# Patient Record
Sex: Female | Born: 1950 | Race: Black or African American | Hispanic: No | Marital: Married | Smoking: Never smoker
Health system: Southern US, Community
[De-identification: ages and names within clinical notes are randomized; demographics above are authoritative.]

## PROBLEM LIST (undated history)

## (undated) DIAGNOSIS — K219 Gastro-esophageal reflux disease without esophagitis: Secondary | ICD-10-CM

## (undated) DIAGNOSIS — C55 Malignant neoplasm of uterus, part unspecified: Secondary | ICD-10-CM

## (undated) DIAGNOSIS — Z87442 Personal history of urinary calculi: Secondary | ICD-10-CM

## (undated) DIAGNOSIS — G459 Transient cerebral ischemic attack, unspecified: Secondary | ICD-10-CM

## (undated) DIAGNOSIS — T8859XA Other complications of anesthesia, initial encounter: Secondary | ICD-10-CM

## (undated) DIAGNOSIS — I10 Essential (primary) hypertension: Secondary | ICD-10-CM

## (undated) DIAGNOSIS — R000 Tachycardia, unspecified: Secondary | ICD-10-CM

## (undated) DIAGNOSIS — E119 Type 2 diabetes mellitus without complications: Secondary | ICD-10-CM

## (undated) DIAGNOSIS — T4145XA Adverse effect of unspecified anesthetic, initial encounter: Secondary | ICD-10-CM

## (undated) DIAGNOSIS — M1990 Unspecified osteoarthritis, unspecified site: Secondary | ICD-10-CM

## (undated) DIAGNOSIS — E785 Hyperlipidemia, unspecified: Secondary | ICD-10-CM

## (undated) DIAGNOSIS — I38 Endocarditis, valve unspecified: Secondary | ICD-10-CM

## (undated) DIAGNOSIS — J45909 Unspecified asthma, uncomplicated: Secondary | ICD-10-CM

## (undated) HISTORY — DX: Unspecified asthma, uncomplicated: J45.909

## (undated) HISTORY — DX: Hyperlipidemia, unspecified: E78.5

## (undated) HISTORY — DX: Essential (primary) hypertension: I10

## (undated) HISTORY — PX: COLONOSCOPY: SHX174

## (undated) HISTORY — DX: Endocarditis, valve unspecified: I38

## (undated) HISTORY — PX: NASAL SINUS SURGERY: SHX719

## (undated) HISTORY — PX: PARTIAL HYSTERECTOMY: SHX80

## (undated) HISTORY — PX: KNEE ARTHROSCOPY: SHX127

## (undated) HISTORY — DX: Malignant neoplasm of uterus, part unspecified: C55

## (undated) HISTORY — PX: SINOSCOPY: SHX187

---
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
## 1987-09-30 DIAGNOSIS — C55 Malignant neoplasm of uterus, part unspecified: Secondary | ICD-10-CM

## 1987-09-30 HISTORY — DX: Malignant neoplasm of uterus, part unspecified: C55

## 1998-05-13 ENCOUNTER — Emergency Department (HOSPITAL_COMMUNITY): Admission: EM | Admit: 1998-05-13 | Discharge: 1998-05-13 | Payer: Self-pay | Admitting: Family Medicine

## 1999-02-08 ENCOUNTER — Other Ambulatory Visit: Admission: RE | Admit: 1999-02-08 | Discharge: 1999-02-08 | Payer: Self-pay | Admitting: Otolaryngology

## 1999-03-06 ENCOUNTER — Other Ambulatory Visit: Admission: RE | Admit: 1999-03-06 | Discharge: 1999-03-06 | Payer: Self-pay | Admitting: Obstetrics and Gynecology

## 1999-04-12 ENCOUNTER — Emergency Department (HOSPITAL_COMMUNITY): Admission: EM | Admit: 1999-04-12 | Discharge: 1999-04-12 | Payer: Self-pay | Admitting: Emergency Medicine

## 2001-10-10 ENCOUNTER — Emergency Department (HOSPITAL_COMMUNITY): Admission: EM | Admit: 2001-10-10 | Discharge: 2001-10-10 | Payer: Self-pay | Admitting: Emergency Medicine

## 2004-09-26 ENCOUNTER — Ambulatory Visit: Payer: Self-pay | Admitting: Internal Medicine

## 2004-11-09 ENCOUNTER — Emergency Department: Payer: Self-pay | Admitting: Emergency Medicine

## 2004-11-13 ENCOUNTER — Ambulatory Visit: Payer: Self-pay

## 2004-12-08 ENCOUNTER — Emergency Department (HOSPITAL_COMMUNITY): Admission: EM | Admit: 2004-12-08 | Discharge: 2004-12-08 | Payer: Self-pay | Admitting: Emergency Medicine

## 2005-09-26 ENCOUNTER — Emergency Department: Payer: Self-pay | Admitting: General Practice

## 2006-04-28 ENCOUNTER — Other Ambulatory Visit: Admission: RE | Admit: 2006-04-28 | Discharge: 2006-04-28 | Payer: Self-pay | Admitting: Gynecology

## 2006-06-01 ENCOUNTER — Emergency Department: Payer: Self-pay | Admitting: Emergency Medicine

## 2006-10-22 ENCOUNTER — Ambulatory Visit: Payer: Self-pay | Admitting: Otolaryngology

## 2007-02-18 ENCOUNTER — Other Ambulatory Visit: Payer: Self-pay

## 2007-02-18 ENCOUNTER — Inpatient Hospital Stay: Payer: Self-pay | Admitting: Internal Medicine

## 2007-05-03 ENCOUNTER — Other Ambulatory Visit: Admission: RE | Admit: 2007-05-03 | Discharge: 2007-05-03 | Payer: Self-pay | Admitting: Gynecology

## 2007-05-05 ENCOUNTER — Emergency Department: Payer: Self-pay | Admitting: Emergency Medicine

## 2007-09-01 ENCOUNTER — Other Ambulatory Visit: Payer: Self-pay

## 2007-09-01 ENCOUNTER — Emergency Department: Payer: Self-pay | Admitting: Emergency Medicine

## 2008-09-09 ENCOUNTER — Emergency Department: Payer: Self-pay | Admitting: Emergency Medicine

## 2008-11-08 ENCOUNTER — Emergency Department: Payer: Self-pay | Admitting: Emergency Medicine

## 2009-08-14 ENCOUNTER — Emergency Department: Payer: Self-pay | Admitting: Emergency Medicine

## 2009-09-30 ENCOUNTER — Emergency Department (HOSPITAL_COMMUNITY): Admission: EM | Admit: 2009-09-30 | Discharge: 2009-09-30 | Payer: Self-pay | Admitting: Family Medicine

## 2010-01-11 ENCOUNTER — Emergency Department (HOSPITAL_COMMUNITY): Admission: EM | Admit: 2010-01-11 | Discharge: 2010-01-11 | Payer: Self-pay | Admitting: *Deleted

## 2010-01-21 ENCOUNTER — Emergency Department (HOSPITAL_COMMUNITY): Admission: EM | Admit: 2010-01-21 | Discharge: 2010-01-21 | Payer: Self-pay | Admitting: Family Medicine

## 2010-02-20 ENCOUNTER — Ambulatory Visit: Payer: Self-pay | Admitting: Internal Medicine

## 2010-08-08 ENCOUNTER — Encounter (INDEPENDENT_AMBULATORY_CARE_PROVIDER_SITE_OTHER): Payer: Self-pay | Admitting: Otolaryngology

## 2010-08-08 ENCOUNTER — Ambulatory Visit (HOSPITAL_COMMUNITY): Admission: RE | Admit: 2010-08-08 | Discharge: 2010-08-09 | Payer: Self-pay | Admitting: Otolaryngology

## 2010-12-10 LAB — ANAEROBIC CULTURE

## 2010-12-10 LAB — URINALYSIS, ROUTINE W REFLEX MICROSCOPIC
Bilirubin Urine: NEGATIVE
Glucose, UA: NEGATIVE mg/dL
Hgb urine dipstick: NEGATIVE
Ketones, ur: NEGATIVE mg/dL
Nitrite: NEGATIVE
Protein, ur: NEGATIVE mg/dL
Specific Gravity, Urine: 1.018 (ref 1.005–1.030)
Urobilinogen, UA: 0.2 mg/dL (ref 0.0–1.0)
pH: 5.5 (ref 5.0–8.0)

## 2010-12-10 LAB — SURGICAL PCR SCREEN
MRSA, PCR: NEGATIVE
Staphylococcus aureus: NEGATIVE

## 2010-12-10 LAB — BASIC METABOLIC PANEL
BUN: 10 mg/dL (ref 6–23)
CO2: 24 mEq/L (ref 19–32)
Calcium: 9.3 mg/dL (ref 8.4–10.5)
Chloride: 110 mEq/L (ref 96–112)
Creatinine, Ser: 0.98 mg/dL (ref 0.4–1.2)
GFR calc Af Amer: >60 mL/min (ref >60)
Glucose, Bld: 110 mg/dL — ABNORMAL HIGH (ref 70–99)

## 2010-12-10 LAB — URINE MICROSCOPIC-ADD ON

## 2010-12-10 LAB — CBC
HCT: 38.8 % (ref 36.0–46.0)
Hemoglobin: 12.8 g/dL (ref 12.0–15.0)
MCH: 27.2 pg (ref 26.0–34.0)
MCHC: 33 g/dL (ref 30.0–36.0)
MCV: 82.4 fL (ref 78.0–100.0)
Platelets: 188 10*3/uL (ref 150–400)
RBC: 4.71 MIL/uL (ref 3.87–5.11)
RDW: 14.7 % (ref 11.5–15.5)
WBC: 6.9 K/uL (ref 4.0–10.5)

## 2010-12-10 LAB — BASIC METABOLIC PANEL WITH GFR
GFR calc non Af Amer: 58 mL/min — ABNORMAL LOW (ref >60)
Potassium: 4.4 meq/L (ref 3.5–5.1)
Sodium: 140 meq/L (ref 135–145)

## 2010-12-10 LAB — EAR CULTURE

## 2011-01-04 ENCOUNTER — Emergency Department: Payer: Self-pay | Admitting: Emergency Medicine

## 2011-01-09 ENCOUNTER — Emergency Department (HOSPITAL_COMMUNITY)
Admission: EM | Admit: 2011-01-09 | Discharge: 2011-01-09 | Disposition: A | Discharge status: Home / Self Care | Payer: Federal, State, Local not specified - PPO | Attending: Emergency Medicine | Admitting: Emergency Medicine

## 2011-01-09 ENCOUNTER — Emergency Department (HOSPITAL_COMMUNITY): Payer: Federal, State, Local not specified - PPO

## 2011-01-09 DIAGNOSIS — Z79899 Other long term (current) drug therapy: Secondary | ICD-10-CM | POA: Insufficient documentation

## 2011-01-09 DIAGNOSIS — J45909 Unspecified asthma, uncomplicated: Secondary | ICD-10-CM | POA: Insufficient documentation

## 2011-01-09 DIAGNOSIS — I10 Essential (primary) hypertension: Secondary | ICD-10-CM | POA: Insufficient documentation

## 2011-01-09 DIAGNOSIS — R630 Anorexia: Secondary | ICD-10-CM | POA: Insufficient documentation

## 2011-01-09 DIAGNOSIS — R109 Unspecified abdominal pain: Secondary | ICD-10-CM | POA: Insufficient documentation

## 2011-01-09 DIAGNOSIS — R110 Nausea: Secondary | ICD-10-CM | POA: Insufficient documentation

## 2011-01-09 DIAGNOSIS — K5900 Constipation, unspecified: Secondary | ICD-10-CM | POA: Insufficient documentation

## 2011-01-09 LAB — COMPREHENSIVE METABOLIC PANEL
ALT: 21 U/L (ref 0–35)
Alkaline Phosphatase: 85 U/L (ref 39–117)
CO2: 27 mEq/L (ref 19–32)
Chloride: 106 mEq/L (ref 96–112)
GFR calc non Af Amer: 56 mL/min — ABNORMAL LOW (ref >60)
Glucose, Bld: 115 mg/dL — ABNORMAL HIGH (ref 70–99)
Potassium: 3.7 mEq/L (ref 3.5–5.1)
Sodium: 138 mEq/L (ref 135–145)
Total Bilirubin: 0.4 mg/dL (ref 0.3–1.2)
Total Protein: 7.1 g/dL (ref 6.0–8.3)

## 2011-01-09 LAB — LIPASE, BLOOD: Lipase: 22 U/L (ref 11–59)

## 2011-01-09 LAB — DIFFERENTIAL
Basophils Absolute: 0.1 10*3/uL (ref 0.0–0.1)
Lymphocytes Relative: 40 % (ref 12–46)
Neutro Abs: 2.9 10*3/uL (ref 1.7–7.7)

## 2011-01-09 LAB — CBC
HCT: 37.8 % (ref 36.0–46.0)
Hemoglobin: 12.2 g/dL (ref 12.0–15.0)
RBC: 4.63 MIL/uL (ref 3.87–5.11)
WBC: 6.1 10*3/uL (ref 4.0–10.5)

## 2011-01-14 ENCOUNTER — Other Ambulatory Visit: Payer: Self-pay | Admitting: Gynecology

## 2011-10-26 ENCOUNTER — Emergency Department: Payer: Self-pay | Admitting: Unknown Physician Specialty

## 2012-02-02 IMAGING — CR DG ABDOMEN ACUTE W/ 1V CHEST
3 series · 3 of 3 positions shown · non-contrast
Comparison: Chest radiograph performed 08/05/2010

CLINICAL DATA: Right-sided abdominal pain, nausea and constipation.

ACUTE ABDOMEN SERIES (ABDOMEN 2 VIEW & CHEST 1 VIEW)

[w chest pa]
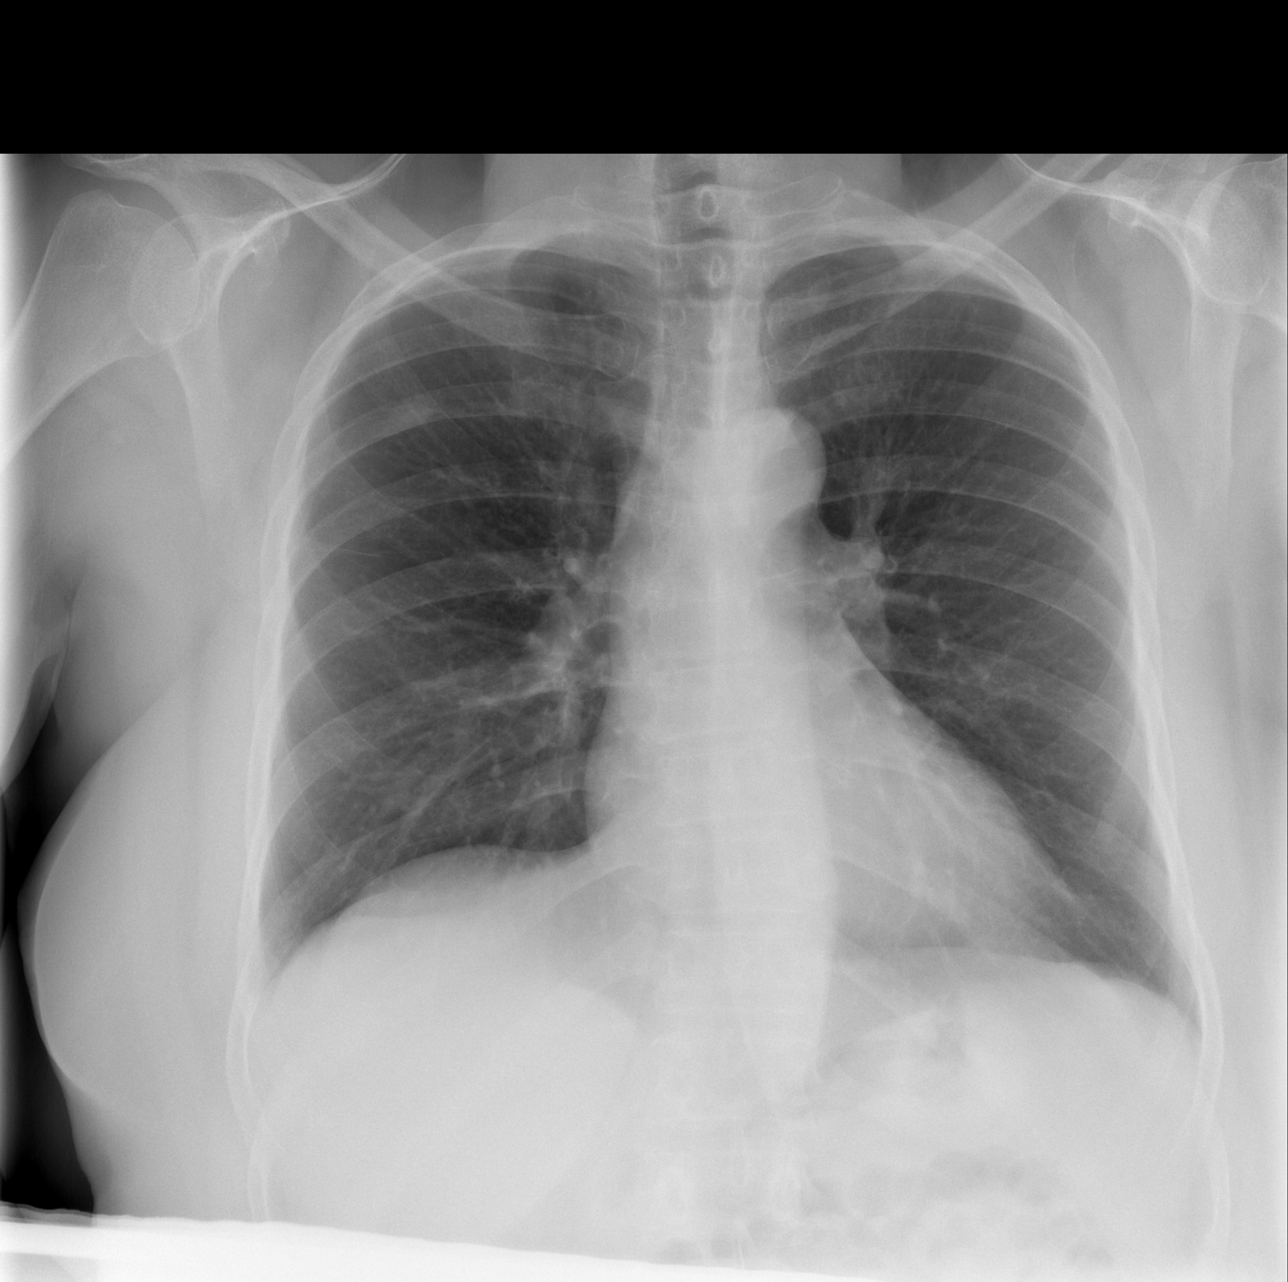

[w abdomen upright]
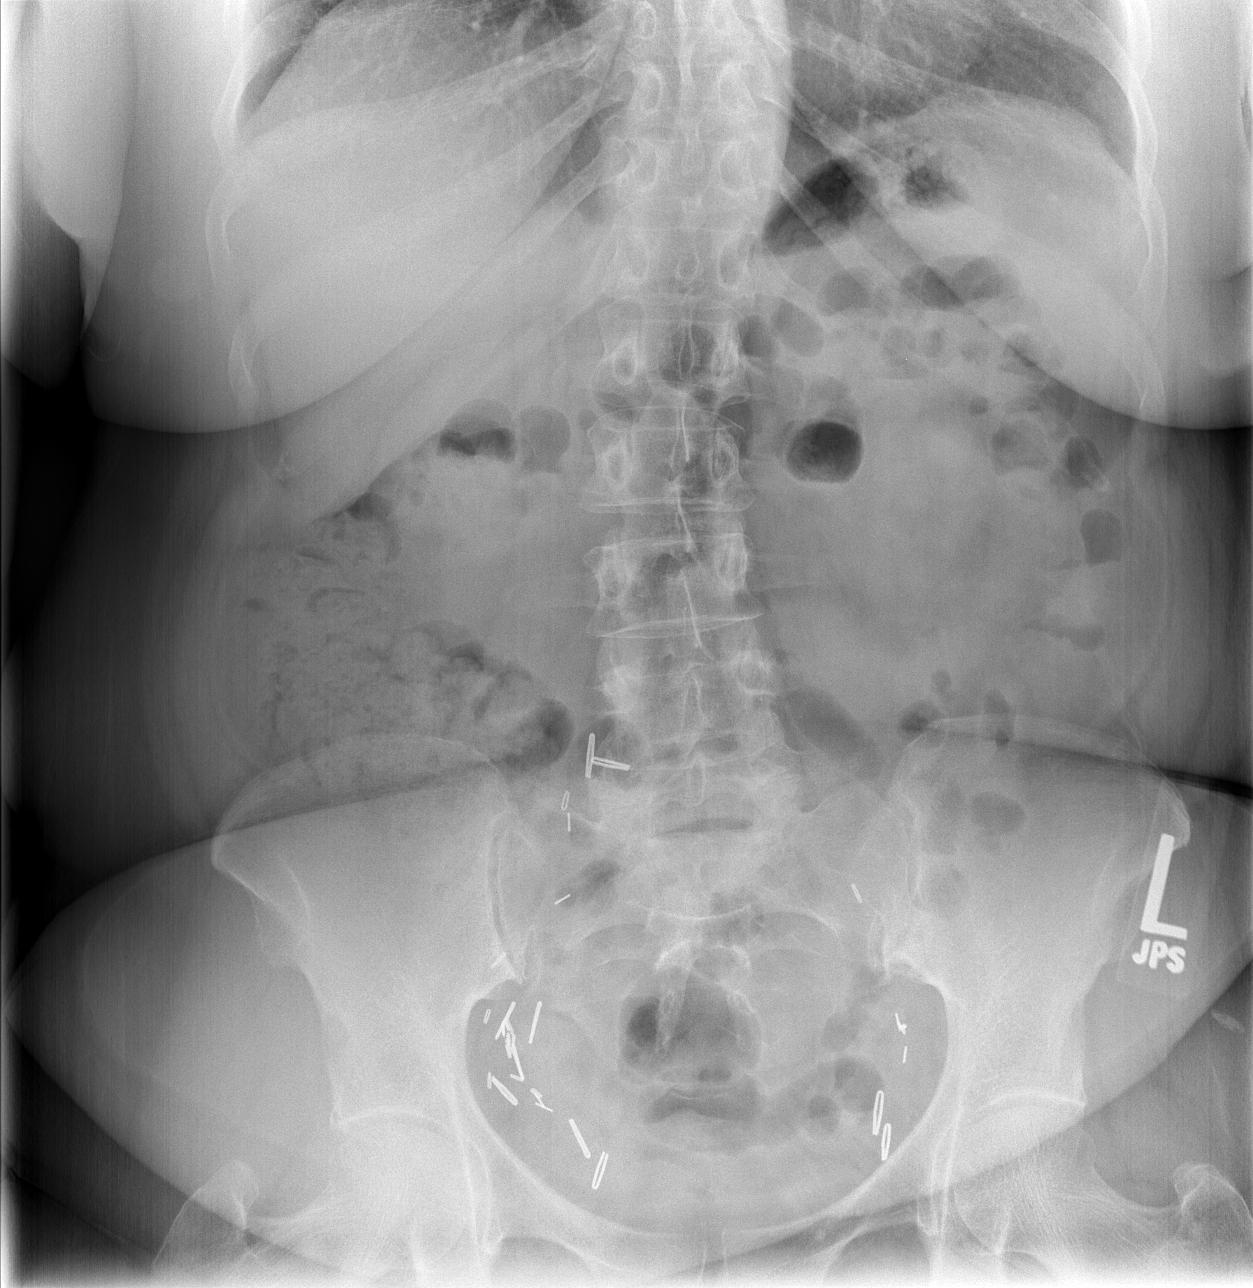

[t abdomen supine]
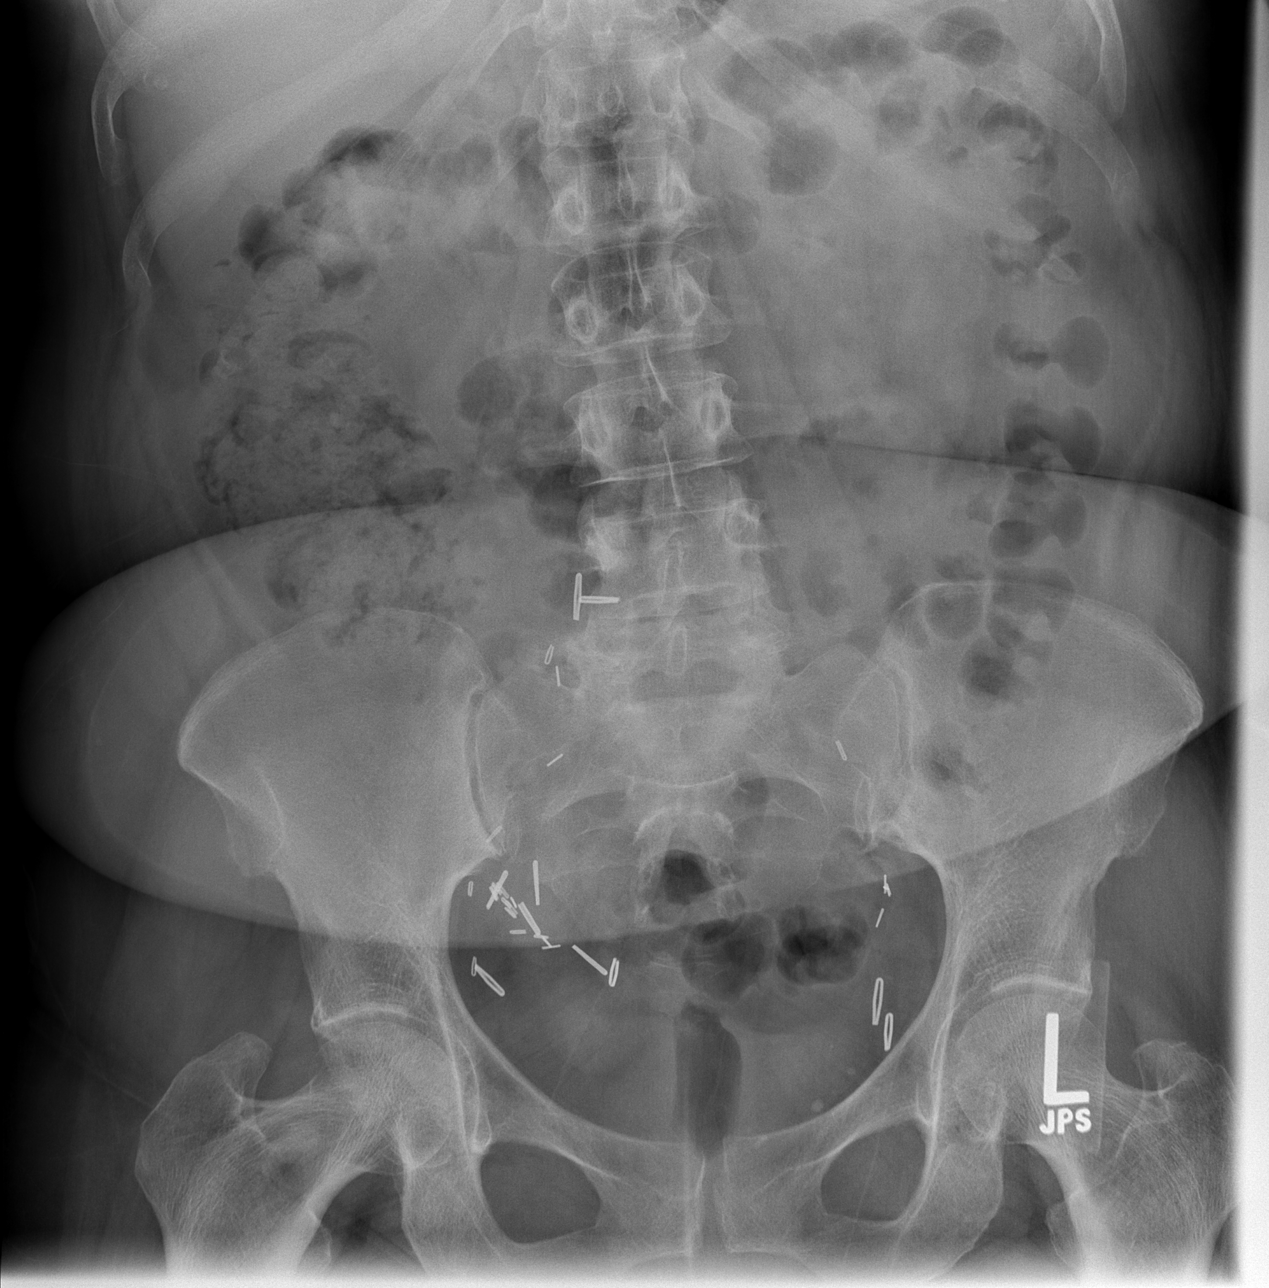

[3 of 3 positions shown; findings below may reference images not displayed]

FINDINGS: The lungs are well-aerated and clear.  There is no
evidence of focal opacification, pleural effusion or pneumothorax.
The cardiomediastinal silhouette is within normal limits.  A focus
of sclerosis overlying the right fifth posterior rib is unchanged
in appearance and likely reflects a bone island.

The visualized bowel gas pattern is unremarkable.  Scattered stool
and air are seen within the colon; there is no evidence of small
bowel dilatation to suggest obstruction.  No free intra-abdominal
air is identified on the provided upright view.

No acute osseous abnormalities are seen; the sacroiliac joints are
unremarkable in appearance.  Numerous clips are noted within the
pelvis.
IMPRESSION: 1.  Unremarkable bowel gas pattern; no free intra-abdominal air
seen.
2.  No acute cardiopulmonary process identified.

## 2012-02-10 ENCOUNTER — Other Ambulatory Visit: Payer: Self-pay | Admitting: Gynecology

## 2012-06-12 ENCOUNTER — Emergency Department: Payer: Self-pay | Admitting: Emergency Medicine

## 2012-06-12 LAB — URINALYSIS, COMPLETE
Bilirubin,UR: NEGATIVE
Glucose,UR: NEGATIVE mg/dL (ref 0–75)
Leukocyte Esterase: NEGATIVE
Nitrite: NEGATIVE
Ph: 5 (ref 4.5–8.0)
RBC,UR: 1 /HPF (ref 0–5)
Squamous Epithelial: <1

## 2012-06-12 LAB — CBC
HCT: 40.2 % (ref 35.0–47.0)
HGB: 13.3 g/dL (ref 12.0–16.0)
RBC: 4.89 10*6/uL (ref 3.80–5.20)
RDW: 15 % — ABNORMAL HIGH (ref 11.5–14.5)
WBC: 3.8 10*3/uL (ref 3.6–11.0)

## 2012-06-12 LAB — COMPREHENSIVE METABOLIC PANEL
Albumin: 3.6 g/dL (ref 3.4–5.0)
Alkaline Phosphatase: 97 U/L (ref 50–136)
BUN: 8 mg/dL (ref 7–18)
Bilirubin,Total: 0.3 mg/dL (ref 0.2–1.0)
Chloride: 107 mmol/L (ref 98–107)
Glucose: 98 mg/dL (ref 65–99)
Potassium: 4.1 mmol/L (ref 3.5–5.1)
SGOT(AST): 37 U/L (ref 15–37)
SGPT (ALT): 28 U/L (ref 12–78)
Total Protein: 7.7 g/dL (ref 6.4–8.2)

## 2013-07-13 ENCOUNTER — Ambulatory Visit: Payer: Self-pay | Admitting: Internal Medicine

## 2015-06-02 DIAGNOSIS — J4550 Severe persistent asthma, uncomplicated: Secondary | ICD-10-CM | POA: Insufficient documentation

## 2015-06-02 DIAGNOSIS — H1010 Acute atopic conjunctivitis, unspecified eye: Secondary | ICD-10-CM | POA: Insufficient documentation

## 2015-06-02 DIAGNOSIS — T63441A Toxic effect of venom of bees, accidental (unintentional), initial encounter: Secondary | ICD-10-CM | POA: Insufficient documentation

## 2015-06-02 DIAGNOSIS — K219 Gastro-esophageal reflux disease without esophagitis: Secondary | ICD-10-CM | POA: Insufficient documentation

## 2015-06-02 DIAGNOSIS — J329 Chronic sinusitis, unspecified: Secondary | ICD-10-CM | POA: Insufficient documentation

## 2015-06-02 DIAGNOSIS — H7010 Chronic mastoiditis, unspecified ear: Secondary | ICD-10-CM | POA: Insufficient documentation

## 2015-06-02 DIAGNOSIS — J309 Allergic rhinitis, unspecified: Secondary | ICD-10-CM | POA: Insufficient documentation

## 2015-06-02 DIAGNOSIS — T7800XA Anaphylactic reaction due to unspecified food, initial encounter: Secondary | ICD-10-CM | POA: Insufficient documentation

## 2015-06-08 ENCOUNTER — Other Ambulatory Visit: Payer: Self-pay | Admitting: *Deleted

## 2015-06-08 MED ORDER — OMALIZUMAB 150 MG ~~LOC~~ SOLR
150.0000 mg | SUBCUTANEOUS | Status: DC
  Administered 2015-08-14 – 2021-02-07 (×65): 150 mg via SUBCUTANEOUS

## 2015-07-25 ENCOUNTER — Ambulatory Visit (INDEPENDENT_AMBULATORY_CARE_PROVIDER_SITE_OTHER): Payer: Federal, State, Local not specified - PPO | Admitting: Allergy and Immunology

## 2015-07-25 ENCOUNTER — Encounter: Payer: Self-pay | Admitting: Allergy and Immunology

## 2015-07-25 VITALS — BP 98/76 | HR 76 | Temp 98.2°F | Resp 12

## 2015-07-25 DIAGNOSIS — J45901 Unspecified asthma with (acute) exacerbation: Secondary | ICD-10-CM

## 2015-07-25 DIAGNOSIS — J0100 Acute maxillary sinusitis, unspecified: Secondary | ICD-10-CM | POA: Diagnosis not present

## 2015-07-25 DIAGNOSIS — J4551 Severe persistent asthma with (acute) exacerbation: Secondary | ICD-10-CM | POA: Insufficient documentation

## 2015-07-25 DIAGNOSIS — T63441D Toxic effect of venom of bees, accidental (unintentional), subsequent encounter: Secondary | ICD-10-CM

## 2015-07-25 DIAGNOSIS — K219 Gastro-esophageal reflux disease without esophagitis: Secondary | ICD-10-CM

## 2015-07-25 DIAGNOSIS — J019 Acute sinusitis, unspecified: Secondary | ICD-10-CM | POA: Insufficient documentation

## 2015-07-25 HISTORY — DX: Acute maxillary sinusitis, unspecified: J01.00

## 2015-07-25 MED ORDER — EPINEPHRINE 0.3 MG/0.3ML IJ SOAJ: 0.3000 mg | INTRAMUSCULAR | Status: DC | PRN

## 2015-07-25 MED ORDER — LEVALBUTEROL HCL 1.25 MG/3ML IN NEBU
1.2500 mg | INHALATION_SOLUTION | Freq: Once | RESPIRATORY_TRACT | Status: AC
  Administered 2015-07-25: 1.25 mg via RESPIRATORY_TRACT

## 2015-07-25 MED ORDER — IPRATROPIUM BROMIDE 0.02 % IN SOLN
0.5000 mg | Freq: Once | RESPIRATORY_TRACT | Status: AC
  Administered 2015-07-25: 0.5 mg via RESPIRATORY_TRACT

## 2015-07-25 MED ORDER — BECLOMETHASONE DIPROPIONATE 80 MCG/ACT IN AERS: 2.0000 | INHALATION_SPRAY | Freq: Two times a day (BID) | RESPIRATORY_TRACT | Status: DC

## 2015-07-25 NOTE — Assessment & Plan Note (Signed)
   Continue omeprazole as prescribed.

## 2015-07-25 NOTE — Patient Instructions (Addendum)
Severe persistent asthma with exacerbation  Prednisone has been provided, 40 mg x3 days, 20 mg x1 day, 10 mg x1 day, then stop.     Anne Kerr has been asked to carefully monitor blood glucose levels while on prednisone.  She has verbalized understanding and has agreed to do so.   During respiratory tract infections and asthma flares, the patient may add Qvar 80 g, 2 inhalations via spacer device twice a day until symptoms have returned to baseline.  A sample and prescription have been provided for Qvar 80 g.  Resume Xolair injections as prescribed when asthma exacerbation and acute sinusitis have resolved.  The patient has been asked to contact me if her symptoms persist, progress, or if she becomes febrile. Otherwise, she may return for follow up in 1 month.   Acute sinusitis  Prednisone has been provided (as above).  Continue fluticasone nasal spray and nasal saline irrigation.  Guaifenesin 1200 mg twice daily as needed with adequate hydration as discussed.    GERD (gastroesophageal reflux disease)  Continue omeprazole as prescribed.  Bee sting reaction  Continue insect avoidance and have access to epinephrine autoinjector 2 pack.    Return in about 4 weeks (around 08/22/2015), or if symptoms worsen or fail to improve.

## 2015-07-25 NOTE — Progress Notes (Signed)
History of present illness: HPI Comments: Anne Kerr is a 64 y.o. female with severe persistent asthma on Xolair, allergic rhinosinusitis, gastroesophageal reflux disease, borderline diabetes, and history of hymenoptera venom hypersensitivity who presents today for sick visit.  She reports that over the past 2 weeks she has been experiencing sinus pressure, thick mucus production, postnasal drainage, nasal congestion, and ear fullness.  In addition, she has been experiencing coughing and wheezing, particularly at nighttime.  She has experienced nocturnal awakenings due to lower respiratory symptoms 5 out of the past 7 nights.  She denies fevers or chills.        Assessment and plan: Severe persistent asthma with exacerbation  Prednisone has been provided, 40 mg x3 days, 20 mg x1 day, 10 mg x1 day, then stop.     Anne Kerr has been asked to carefully monitor blood glucose levels while on prednisone.  She has verbalized understanding and has agreed to do so.   During respiratory tract infections and asthma flares, the patient may add Qvar 80 g, 2 inhalations via spacer device twice a day until symptoms have returned to baseline.  A sample and prescription have been provided for Qvar 80 g.  Resume Xolair injections as prescribed when asthma exacerbation and acute sinusitis have resolved.  The patient has been asked to contact me if her symptoms persist, progress, or if she becomes febrile. Otherwise, she may return for follow up in 1 month.   Acute sinusitis  Prednisone has been provided (as above).  Continue fluticasone nasal spray and nasal saline irrigation.  Guaifenesin 1200 mg twice daily as needed with adequate hydration as discussed.    GERD (gastroesophageal reflux disease)  Continue omeprazole as prescribed.  Bee sting reaction  Continue insect avoidance and have access to epinephrine autoinjector 2 pack.    Medications ordered this encounter: Meds ordered this  encounter  Medications  . EPINEPHrine (EPIPEN 2-PAK) 0.3 mg/0.3 mL IJ SOAJ injection    Sig: Inject 0.3 mLs (0.3 mg total) into the muscle as needed.    Dispense:  2 Device    Refill:  2  . levalbuterol (XOPENEX) nebulizer solution 1.25 mg    Sig:   . ipratropium (ATROVENT) nebulizer solution 0.5 mg    Sig:   . beclomethasone (QVAR) 80 MCG/ACT inhaler    Sig: Inhale 2 puffs into the lungs 2 (two) times daily.    Dispense:  1 Inhaler    Refill:  2     Diagnositics: Spirometry reveals FVC of 1.72 L and FEV1 of 1.26 L (64% predicted) with significant (380 mL, 28%) post bronchodilator improvement.     Physical examination: Blood pressure 98/76, pulse 76, temperature 98.2 F (36.8 C), temperature source Oral, resp. rate 12.  General: Alert, interactive, in no acute distress. HEENT: TMs pearly gray, turbinates moderately edematous with thick discharge, post-pharynx erythematous. Neck: Supple without lymphadenopathy. Lungs: Mildly decreased breath sounds with expiratory wheezing bilaterally. CV: Normal S1, S2 without murmurs. Skin: Warm and dry, without lesions or rashes.  The following portions of the patient's history were reviewed and updated as appropriate: allergies, current medications, past family history, past medical history, past social history, past surgical history and problem list.  Outpatient medications:   Medication List       This list is accurate as of: 07/25/15 12:59 PM.  Always use your most recent med list.               amLODipine 5 MG tablet  Commonly known as:  NORVASC  Take 5 mg by mouth daily.     beclomethasone 80 MCG/ACT inhaler  Commonly known as:  QVAR  Inhale 2 puffs into the lungs 2 (two) times daily.     EPINEPHrine 0.3 mg/0.3 mL Soaj injection  Commonly known as:  EPIPEN 2-PAK  Inject 0.3 mLs (0.3 mg total) into the muscle as needed.     fluticasone 110 MCG/ACT inhaler  Commonly known as:  FLOVENT HFA  Inhale 2 puffs into the lungs  2 (two) times daily.     lactobacillus acidophilus & bulgar chewable tablet  Chew 1 tablet by mouth 2 (two) times daily.     metFORMIN 500 MG tablet  Commonly known as:  GLUCOPHAGE  Take 500 mg by mouth daily.     montelukast 10 MG tablet  Commonly known as:  SINGULAIR  Take 10 mg by mouth at bedtime.     NASACORT AQ 55 MCG/ACT Aero nasal inhaler  Generic drug:  triamcinolone  Place 1 spray into the nose 3 (three) times a week.     NASACORT AQ NA  Place 2 sprays into the nose 2 (two) times daily.     omeprazole 20 MG capsule  Commonly known as:  PRILOSEC  Take 20 mg by mouth as needed.     PROAIR HFA 108 (90 BASE) MCG/ACT inhaler  Generic drug:  albuterol  Inhale 2 puffs into the lungs every 4 (four) hours as needed for wheezing or shortness of breath.     vitamin B-12 1000 MCG tablet  Commonly known as:  CYANOCOBALAMIN  Take 1,000 mcg by mouth daily.     VITAMIN D-3 PO  Take 800 mg by mouth daily.     XOLAIR 150 MG injection  Generic drug:  omalizumab  Inject 150 mg as directed every 30 (thirty) days.        Known medication allergies: Allergies  Allergen Reactions  . Aspirin Other (See Comments)    Wheeze  . Ibuprofen Shortness Of Breath, Other (See Comments) and Cough    Wheeze  . Shellfish Allergy Other (See Comments)    Asthma Symptoms  . Other Swelling    Bee Sting    I appreciate the opportunity to take part in this Jem's care. Please do not hesitate to contact me with questions.  Sincerely,   R. Edgar Frisk, MD

## 2015-07-25 NOTE — Assessment & Plan Note (Addendum)
   Prednisone has been provided (as above).  Continue fluticasone nasal spray and nasal saline irrigation.  Guaifenesin 1200 mg twice daily as needed with adequate hydration as discussed.

## 2015-07-25 NOTE — Assessment & Plan Note (Addendum)
   Prednisone has been provided, 40 mg x3 days, 20 mg x1 day, 10 mg x1 day, then stop.     Anne Kerr has been asked to carefully monitor blood glucose levels while on prednisone.  She has verbalized understanding and has agreed to do so.   During respiratory tract infections and asthma flares, the patient may add Qvar 80 g, 2 inhalations via spacer device twice a day until symptoms have returned to baseline.  A sample and prescription have been provided for Qvar 80 g.  Resume Xolair injections as prescribed when asthma exacerbation and acute sinusitis have resolved.  The patient has been asked to contact me if her symptoms persist, progress, or if she becomes febrile. Otherwise, she may return for follow up in 1 month.

## 2015-07-25 NOTE — Assessment & Plan Note (Signed)
   Continue insect avoidance and have access to epinephrine autoinjector 2 pack.

## 2015-07-27 ENCOUNTER — Other Ambulatory Visit: Payer: Self-pay | Admitting: *Deleted

## 2015-07-27 DIAGNOSIS — J4551 Severe persistent asthma with (acute) exacerbation: Secondary | ICD-10-CM

## 2015-07-31 ENCOUNTER — Telehealth: Payer: Self-pay | Admitting: Allergy and Immunology

## 2015-07-31 NOTE — Telephone Encounter (Signed)
Dr. Starling Manns please advise on below.

## 2015-07-31 NOTE — Telephone Encounter (Signed)
PT CALLED AND SAID THAT SHE HAS RASH ON HER ARMS AND FEET, BACK. SHE BEEN TAKING BENADRYL AND HELP SOME, WANT TO KNOW WANT TO DO. [phone]. [email or web address]

## 2015-07-31 NOTE — Telephone Encounter (Signed)
Continue diphenhydramine and may add cetirizine.  If rash persists or progresses, come in for follow up.  For any symptoms concerning for anaphylaxis, seek immediate medical attention.

## 2015-07-31 NOTE — Telephone Encounter (Signed)
Patient informed of Dr. Mariane Masters instructions.

## 2015-08-14 ENCOUNTER — Ambulatory Visit (INDEPENDENT_AMBULATORY_CARE_PROVIDER_SITE_OTHER): Payer: Federal, State, Local not specified - PPO | Admitting: Allergy and Immunology

## 2015-08-14 ENCOUNTER — Encounter: Payer: Self-pay | Admitting: Allergy and Immunology

## 2015-08-14 ENCOUNTER — Ambulatory Visit (INDEPENDENT_AMBULATORY_CARE_PROVIDER_SITE_OTHER): Payer: Federal, State, Local not specified - PPO

## 2015-08-14 VITALS — BP 130/82 | HR 72 | Resp 16

## 2015-08-14 DIAGNOSIS — T63441D Toxic effect of venom of bees, accidental (unintentional), subsequent encounter: Secondary | ICD-10-CM | POA: Diagnosis not present

## 2015-08-14 DIAGNOSIS — H1010 Acute atopic conjunctivitis, unspecified eye: Secondary | ICD-10-CM

## 2015-08-14 DIAGNOSIS — J4540 Moderate persistent asthma, uncomplicated: Secondary | ICD-10-CM | POA: Diagnosis not present

## 2015-08-14 DIAGNOSIS — J4550 Severe persistent asthma, uncomplicated: Secondary | ICD-10-CM

## 2015-08-14 DIAGNOSIS — J309 Allergic rhinitis, unspecified: Secondary | ICD-10-CM

## 2015-08-14 MED ORDER — ALBUTEROL SULFATE (2.5 MG/3ML) 0.083% IN NEBU: 2.5000 mg | INHALATION_SOLUTION | RESPIRATORY_TRACT | Status: AC | PRN

## 2015-08-14 NOTE — Assessment & Plan Note (Addendum)
Today's spirometry results, assessed while asymptomatic, suggest that the patient may be an under-perceiver of bronchoconstriction.  For now, continue Xolair every month, Qvar 80 g, 2 inhalations via spacer device twice a day, montelukast 10 mg daily at bedtime, and albuterol HFA, 1-2 inhalations every 4-6 hours as needed.  During upper respiratory tract infections or asthma flares, increase Qvar 80 g to 3 inhalations via spacer device 3 times per day until symptoms have returned baseline.  Subjective and objective measures of pulmonary function will be followed and the treatment plan will be adjusted accordingly.

## 2015-08-14 NOTE — Progress Notes (Signed)
History of present illness: HPI Comments: Anne Kerr is a 64 y.o. female with severe persistent asthma, allergic rhinoconjunctivitis, and hymenoptera venom hypersensitivity who presents today for follow up.  She reports that her asthma exacerbation and sinusitis resolved within a week of her previous visit and her upper and lower respiration symptoms have been well-controlled in the interval since that visit.  She denies nocturnal awakenings or limitations in daily activities due to asthma symptoms.  She has no nasal/sinus symptom complaints today.  She has had no insect stings since her previous visit and has access to epinephrine autoinjector 2 pack.   Assessment and plan: Severe persistent asthma Today's spirometry results, assessed while asymptomatic, suggest that the patient may be an under-perceiver of bronchoconstriction.  For now, continue Xolair every month, Qvar 80 g, 2 inhalations via spacer device twice a day, montelukast 10 mg daily at bedtime, and albuterol HFA, 1-2 inhalations every 4-6 hours as needed.  During upper respiratory tract infections or asthma flares, increase Qvar 80 g to 3 inhalations via spacer device 3 times per day until symptoms have returned baseline.  Subjective and objective measures of pulmonary function will be followed and the treatment plan will be adjusted accordingly.  Allergic rhinoconjunctivitis Stable.  Continue allergen avoidance measures, montelukast 10 mg daily at bedtime, and triamcinolone nasal spray as needed.  Nasal saline lavage as needed has been recommended along with instructions for proper administration.  Bee sting reaction  Continue insect avoidance and have access to epinephrine autoinjector 2 pack.    Medications ordered this encounter: No orders of the defined types were placed in this encounter.    Diagnositics: Spirometry reveals FVC of 1.70 L and FEV1 of 1.28 L (65% predicted) with significant (210 mL, 17%)  postbronchodilator improvement.     Physical examination: Blood pressure 130/82, pulse 72, resp. rate 16.  General: Alert, interactive, in no acute distress. HEENT: TMs pearly gray, turbinates mildly edematous without discharge, post-pharynx non erythematous. Neck: Supple without lymphadenopathy. Lungs: Mildly decreased breath sounds bilaterally without wheezing, rhonchi or rales. CV: Normal S1, S2 without murmurs. Skin: Warm and dry, without lesions or rashes.  The following portions of the patient's history were reviewed and updated as appropriate: allergies, current medications, past family history, past medical history, past social history, past surgical history and problem list.  Outpatient medications:   Medication List       This list is accurate as of: 08/14/15 12:31 PM.  Always use your most recent med list.               amLODipine 5 MG tablet  Commonly known as:  NORVASC  Take 5 mg by mouth daily.     beclomethasone 80 MCG/ACT inhaler  Commonly known as:  QVAR  Inhale 2 puffs into the lungs 2 (two) times daily.     EPINEPHrine 0.3 mg/0.3 mL Soaj injection  Commonly known as:  EPIPEN 2-PAK  Inject 0.3 mLs (0.3 mg total) into the muscle as needed.     fluticasone 110 MCG/ACT inhaler  Commonly known as:  FLOVENT HFA  Inhale 2 puffs into the lungs 2 (two) times daily.     lactobacillus acidophilus & bulgar chewable tablet  Chew 1 tablet by mouth 2 (two) times daily.     metFORMIN 500 MG tablet  Commonly known as:  GLUCOPHAGE  Take 500 mg by mouth daily.     montelukast 10 MG tablet  Commonly known as:  SINGULAIR  Take 10 mg by  mouth at bedtime.     NASACORT AQ 55 MCG/ACT Aero nasal inhaler  Generic drug:  triamcinolone  Place 1 spray into the nose 3 (three) times a week.     NASACORT AQ NA  Place 2 sprays into the nose 2 (two) times daily.     omeprazole 20 MG capsule  Commonly known as:  PRILOSEC  Take 20 mg by mouth as needed.     PROAIR HFA  108 (90 BASE) MCG/ACT inhaler  Generic drug:  albuterol  Inhale 2 puffs into the lungs every 4 (four) hours as needed for wheezing or shortness of breath.     vitamin B-12 1000 MCG tablet  Commonly known as:  CYANOCOBALAMIN  Take 1,000 mcg by mouth daily.     VITAMIN D-3 PO  Take 800 mg by mouth daily.     XOLAIR 150 MG injection  Generic drug:  omalizumab  Inject 150 mg as directed every 30 (thirty) days.        Known medication allergies: Allergies  Allergen Reactions  . Aspirin Other (See Comments)    Wheeze  . Ibuprofen Shortness Of Breath, Other (See Comments) and Cough    Wheeze  . Shellfish Allergy Other (See Comments)    Asthma Symptoms  . Other Swelling    Bee Sting    I appreciate the opportunity to take part in this Tayloranne's care. Please do not hesitate to contact me with questions.  Sincerely,   R. Edgar Frisk, MD

## 2015-08-14 NOTE — Assessment & Plan Note (Signed)
   Continue insect avoidance and have access to epinephrine autoinjector 2 pack. 

## 2015-08-14 NOTE — Assessment & Plan Note (Signed)
Stable.  Continue allergen avoidance measures, montelukast 10 mg daily at bedtime, and triamcinolone nasal spray as needed.  Nasal saline lavage as needed has been recommended along with instructions for proper administration.

## 2015-08-14 NOTE — Patient Instructions (Addendum)
Severe persistent asthma Today's spirometry results, assessed while asymptomatic, suggest that the patient may be an under-perceiver of bronchoconstriction.  For now, continue Xolair every month, Qvar 80 g, 2 inhalations via spacer device twice a day, montelukast 10 mg daily at bedtime, and albuterol HFA, 1-2 inhalations every 4-6 hours as needed.  During upper respiratory tract infections or asthma flares, increase Qvar 80 g to 3 inhalations via spacer device 3 times per day until symptoms have returned baseline.  Subjective and objective measures of pulmonary function will be followed and the treatment plan will be adjusted accordingly.  Allergic rhinoconjunctivitis Stable.  Continue allergen avoidance measures, montelukast 10 mg daily at bedtime, and triamcinolone nasal spray as needed.  Nasal saline lavage as needed has been recommended along with instructions for proper administration.  Bee sting reaction  Continue insect avoidance and have access to epinephrine autoinjector 2 pack.    Return in about 4 months (around 12/12/2015), or if symptoms worsen or fail to improve.

## 2015-08-14 NOTE — Assessment & Plan Note (Signed)
>>  ASSESSMENT AND PLAN FOR ALLERGIC RHINITIS WRITTEN ON 08/14/2015 12:07 PM BY BOBBITT, Heywood Iles, MD  Stable. Continue allergen avoidance measures, montelukast 10 mg daily at bedtime, and triamcinolone nasal spray as needed. Nasal saline lavage as needed has been recommended along with instructions for proper administration.

## 2015-09-10 ENCOUNTER — Ambulatory Visit (INDEPENDENT_AMBULATORY_CARE_PROVIDER_SITE_OTHER): Payer: Federal, State, Local not specified - PPO | Admitting: Neurology

## 2015-09-10 DIAGNOSIS — J4540 Moderate persistent asthma, uncomplicated: Secondary | ICD-10-CM | POA: Diagnosis not present

## 2015-10-15 ENCOUNTER — Ambulatory Visit (INDEPENDENT_AMBULATORY_CARE_PROVIDER_SITE_OTHER): Payer: Federal, State, Local not specified - PPO

## 2015-10-15 DIAGNOSIS — J4540 Moderate persistent asthma, uncomplicated: Secondary | ICD-10-CM

## 2015-11-12 ENCOUNTER — Other Ambulatory Visit: Payer: Self-pay

## 2015-11-12 DIAGNOSIS — J4551 Severe persistent asthma with (acute) exacerbation: Secondary | ICD-10-CM

## 2015-11-12 MED ORDER — BECLOMETHASONE DIPROPIONATE 80 MCG/ACT IN AERS: 2.0000 | INHALATION_SPRAY | Freq: Two times a day (BID) | RESPIRATORY_TRACT | Status: DC

## 2015-11-13 ENCOUNTER — Other Ambulatory Visit: Payer: Self-pay | Admitting: Allergy and Immunology

## 2015-11-19 ENCOUNTER — Ambulatory Visit (INDEPENDENT_AMBULATORY_CARE_PROVIDER_SITE_OTHER): Payer: Federal, State, Local not specified - PPO

## 2015-11-19 DIAGNOSIS — J4540 Moderate persistent asthma, uncomplicated: Secondary | ICD-10-CM | POA: Diagnosis not present

## 2015-12-10 ENCOUNTER — Ambulatory Visit: Payer: Federal, State, Local not specified - PPO | Admitting: Allergy and Immunology

## 2015-12-11 ENCOUNTER — Ambulatory Visit (INDEPENDENT_AMBULATORY_CARE_PROVIDER_SITE_OTHER): Payer: Federal, State, Local not specified - PPO | Admitting: Allergy and Immunology

## 2015-12-11 VITALS — BP 110/80 | HR 100 | Resp 18

## 2015-12-11 DIAGNOSIS — H7010 Chronic mastoiditis, unspecified ear: Secondary | ICD-10-CM | POA: Diagnosis not present

## 2015-12-11 DIAGNOSIS — J4540 Moderate persistent asthma, uncomplicated: Secondary | ICD-10-CM

## 2015-12-11 DIAGNOSIS — J309 Allergic rhinitis, unspecified: Secondary | ICD-10-CM

## 2015-12-11 DIAGNOSIS — K219 Gastro-esophageal reflux disease without esophagitis: Secondary | ICD-10-CM | POA: Diagnosis not present

## 2015-12-11 DIAGNOSIS — J329 Chronic sinusitis, unspecified: Secondary | ICD-10-CM

## 2015-12-11 DIAGNOSIS — H1010 Acute atopic conjunctivitis, unspecified eye: Secondary | ICD-10-CM | POA: Diagnosis not present

## 2015-12-11 MED ORDER — METHYLPREDNISOLONE ACETATE 80 MG/ML IJ SUSP
80.0000 mg | Freq: Once | INTRAMUSCULAR | Status: AC
  Administered 2015-12-11: 80 mg via INTRAMUSCULAR

## 2015-12-11 MED ORDER — AMOXICILLIN-POT CLAVULANATE 875-125 MG PO TABS: ORAL_TABLET | ORAL | Status: DC

## 2015-12-11 NOTE — Patient Instructions (Addendum)
  1. Depo-Medrol 80 IM delivered in clinic today  2. Augmentin 875 one tablet twice a day for the next 20 days  3. Continue Qvar 80 2 inhalations twice a day, Nasacort one spray each nostril 3-7 times per week, montelukast 10 mg daily  4. Continue Xolair and EpiPen  5. Continue omeprazole 20 mg a day  6. Continue Proventil HFA if needed  7. Further treatment?  8. Return to clinic in July 2017 or earlier if problem

## 2015-12-11 NOTE — Progress Notes (Signed)
Follow-up Note  Referring Provider: Allyne Gee, MD Primary Provider: Allyne Gee, MD Date of Office Visit: 12/11/2015  Subjective:   Anne Kerr (DOB: 09-09-1951) is a 65 y.o. female who returns to the [facility] on 12/11/2015 in re-evaluation of the following:  HPI Comments: Jozelynn presents this clinic with a 2 week history of developing problems with significant nasal congestion and nose blowing with bloody mucus and head stopped up and years stopped up. Prior to this point time she is doing quite well regarding her allergic rhinoconjunctivitis and asthma. She did not have to use a short acting bronchodilator nor did she have to use a steroid in the treatment of a respiratory tract flare while consistently using Xolair and Qvar. Her upper airways were doing quite well while consistently using a nasal steroid and montelukast. Reflux was under excellent control while using omeprazole. She did obtain the flu vaccine this fall.     Outpatient Prescriptions Prior to Visit  Medication Sig Dispense Refill  . albuterol (PROAIR HFA) 108 (90 BASE) MCG/ACT inhaler Inhale 2 puffs into the lungs every 4 (four) hours as needed for wheezing or shortness of breath.    Marland Kitchen albuterol (PROVENTIL) (2.5 MG/3ML) 0.083% nebulizer solution Take 3 mLs (2.5 mg total) by nebulization every 4 (four) hours as needed for wheezing or shortness of breath. 75 mL 1  . amLODipine (NORVASC) 5 MG tablet Take 5 mg by mouth daily.    . BD DISP NEEDLES 25G X 5/8" MISC USE AS DIRECTED TO ADMINISTER XOLAIR 3 each 11  . beclomethasone (QVAR) 80 MCG/ACT inhaler Inhale 2 puffs into the lungs 2 (two) times daily. 1 Inhaler 0  . Cholecalciferol (VITAMIN D-3 PO) Take 800 mg by mouth daily.    Marland Kitchen EPINEPHrine (EPIPEN 2-PAK) 0.3 mg/0.3 mL IJ SOAJ injection Inject 0.3 mLs (0.3 mg total) into the muscle as needed. 2 Device 2  . lactobacillus acidophilus & bulgar (LACTINEX) chewable tablet Chew 1 tablet by mouth 2  (two) times daily.  0  . montelukast (SINGULAIR) 10 MG tablet Take 10 mg by mouth at bedtime.    Marland Kitchen omeprazole (PRILOSEC) 20 MG capsule Take 20 mg by mouth as needed.    . triamcinolone (NASACORT AQ) 55 MCG/ACT AERO nasal inhaler Place 1 spray into the nose 3 (three) times a week.    . Triamcinolone Acetonide (NASACORT AQ NA) Place 2 sprays into the nose 2 (two) times daily.    . vitamin B-12 (CYANOCOBALAMIN) 1000 MCG tablet Take 1,000 mcg by mouth daily.    . Water For Injection Sterile (STERILE WATER, PRESERVATIVE FREE,) injection RECONSTITUTE EACH 150MG  VIAL OF Arvid Right WITH 1.4ML OF STERILE WATER. STORE AT ROOM TEMPERATURE. DISCARD AFTER USE. 1 mL 11  . XOLAIR 150 MG injection Inject 150 mg as directed every 30 (thirty) days.    . metFORMIN (GLUCOPHAGE) 500 MG tablet Take 500 mg by mouth daily. Reported on 12/11/2015     Facility-Administered Medications Prior to Visit  Medication Dose Route Frequency Provider Last Rate Last Dose  . omalizumab Arvid Right) injection 150 mg  150 mg Subcutaneous Q28 days Jiles Prows, MD   150 mg at 11/19/15 1131    No past medical history on file.  No past surgical history on file.  Allergies  Allergen Reactions  . Aspirin Other (See Comments)    Wheeze  . Ibuprofen Shortness Of Breath, Other (See Comments) and Cough    Wheeze  . Shellfish Allergy  Other (See Comments)    Asthma Symptoms  . Other Swelling    Bee Sting    Review of systems negative except as noted in HPI / PMHx or noted below:  Review of Systems  Constitutional: Negative.   HENT: Negative.   Eyes: Negative.   Respiratory: Negative.   Cardiovascular: Negative.   Gastrointestinal: Negative.   Genitourinary: Negative.   Musculoskeletal: Negative.   Skin: Negative.   Neurological: Negative.   Endo/Heme/Allergies: Negative.   Psychiatric/Behavioral: Negative.      Objective:   Filed Vitals:   12/11/15 1038  BP: 110/80  Pulse: 100  Resp: 18          Physical Exam    Constitutional: She is well-developed, well-nourished, and in no distress.  Nasal voice  HENT:  Head: Normocephalic.  Right Ear: External ear and ear canal normal. Tympanic membrane is erythematous. A middle ear effusion is present.  Left Ear: External ear and ear canal normal. Tympanic membrane is scarred.  Nose: Mucosal edema (Erythematous) present. No rhinorrhea.  Mouth/Throat: Uvula is midline, oropharynx is clear and moist and mucous membranes are normal. No oropharyngeal exudate.  Eyes: Conjunctivae are normal.  Neck: Trachea normal. No tracheal tenderness present. No tracheal deviation present. No thyromegaly present.  Cardiovascular: Normal rate, regular rhythm, S1 normal, S2 normal and normal heart sounds.   No murmur heard. Pulmonary/Chest: Breath sounds normal. No stridor. No respiratory distress. She has no wheezes. She has no rales.  Musculoskeletal: She exhibits no edema.  Lymphadenopathy:       Head (right side): No tonsillar adenopathy present.       Head (left side): No tonsillar adenopathy present.    She has no cervical adenopathy.    She has no axillary adenopathy.  Neurological: She is alert. Gait normal.  Skin: No rash noted. She is not diaphoretic. No erythema. Nails show no clubbing.  Psychiatric: Mood and affect normal.    Diagnostics:    Spirometry was performed and demonstrated an FEV1 of 1.50 at 76 % of predicted.  The patient had an Asthma Control Test with the following results:  .    Assessment and Plan:   1. Asthma, moderate persistent, uncomplicated   2. Allergic rhinoconjunctivitis   3. Gastroesophageal reflux disease, esophagitis presence not specified   4. Chronic sinusitis, unspecified location   5. Chronic mastoiditis, unspecified laterality     1. Depo-Medrol 80 IM delivered in clinic today  2. Augmentin 875 one tablet twice a day for the next 20 days  3. Continue Qvar 80 2 inhalations twice a day, Nasacort one spray each nostril 3-7  times per week, montelukast 10 mg daily  4. Continue Xolair and EpiPen  5. Continue omeprazole 20 mg a day  6. Continue Proventil HFA if needed  7. Further treatment?  8. Return to clinic in July 2017 or earlier if problem  I will treat Lilly with a combination of anti-inflammatory medications and antibiotics over the course of the next 20 days and will make a determination about how to proceed pending her response. She will continue to use Xolair and EpiPen in the treatment of her atopic respiratory disease and continue to use omeprazole in the treatment of her reflux-induced respiratory disease.  Allena Katz, MD [place]

## 2015-12-12 ENCOUNTER — Encounter: Payer: Self-pay | Admitting: Allergy and Immunology

## 2015-12-24 ENCOUNTER — Ambulatory Visit (INDEPENDENT_AMBULATORY_CARE_PROVIDER_SITE_OTHER): Payer: Federal, State, Local not specified - PPO

## 2015-12-24 DIAGNOSIS — J4540 Moderate persistent asthma, uncomplicated: Secondary | ICD-10-CM | POA: Diagnosis not present

## 2015-12-26 ENCOUNTER — Other Ambulatory Visit: Payer: Self-pay | Admitting: *Deleted

## 2015-12-26 DIAGNOSIS — J4551 Severe persistent asthma with (acute) exacerbation: Secondary | ICD-10-CM

## 2015-12-26 MED ORDER — BECLOMETHASONE DIPROPIONATE 80 MCG/ACT IN AERS: 2.0000 | INHALATION_SPRAY | Freq: Two times a day (BID) | RESPIRATORY_TRACT | Status: DC

## 2016-01-21 ENCOUNTER — Ambulatory Visit (INDEPENDENT_AMBULATORY_CARE_PROVIDER_SITE_OTHER): Payer: Federal, State, Local not specified - PPO

## 2016-01-21 DIAGNOSIS — J4540 Moderate persistent asthma, uncomplicated: Secondary | ICD-10-CM

## 2016-02-18 ENCOUNTER — Ambulatory Visit (INDEPENDENT_AMBULATORY_CARE_PROVIDER_SITE_OTHER): Payer: Federal, State, Local not specified - PPO

## 2016-02-18 DIAGNOSIS — J4540 Moderate persistent asthma, uncomplicated: Secondary | ICD-10-CM | POA: Diagnosis not present

## 2016-03-17 ENCOUNTER — Ambulatory Visit (INDEPENDENT_AMBULATORY_CARE_PROVIDER_SITE_OTHER): Payer: Federal, State, Local not specified - PPO

## 2016-03-17 DIAGNOSIS — J4540 Moderate persistent asthma, uncomplicated: Secondary | ICD-10-CM

## 2016-03-18 DIAGNOSIS — J4540 Moderate persistent asthma, uncomplicated: Secondary | ICD-10-CM

## 2016-04-02 ENCOUNTER — Ambulatory Visit (INDEPENDENT_AMBULATORY_CARE_PROVIDER_SITE_OTHER): Payer: Federal, State, Local not specified - PPO | Admitting: Allergy and Immunology

## 2016-04-02 ENCOUNTER — Encounter: Payer: Self-pay | Admitting: Allergy and Immunology

## 2016-04-02 VITALS — BP 148/88 | HR 70 | Temp 98.8°F | Resp 16

## 2016-04-02 DIAGNOSIS — J4540 Moderate persistent asthma, uncomplicated: Secondary | ICD-10-CM | POA: Diagnosis not present

## 2016-04-02 DIAGNOSIS — J329 Chronic sinusitis, unspecified: Secondary | ICD-10-CM

## 2016-04-02 DIAGNOSIS — H1010 Acute atopic conjunctivitis, unspecified eye: Secondary | ICD-10-CM

## 2016-04-02 DIAGNOSIS — J309 Allergic rhinitis, unspecified: Secondary | ICD-10-CM | POA: Diagnosis not present

## 2016-04-02 DIAGNOSIS — H7010 Chronic mastoiditis, unspecified ear: Secondary | ICD-10-CM

## 2016-04-02 MED ORDER — AMOXICILLIN-POT CLAVULANATE 875-125 MG PO TABS: 1.0000 | ORAL_TABLET | Freq: Two times a day (BID) | ORAL | Status: DC

## 2016-04-02 MED ORDER — METHYLPREDNISOLONE ACETATE 80 MG/ML IJ SUSP
80.0000 mg | Freq: Once | INTRAMUSCULAR | Status: AC
  Administered 2016-04-02: 80 mg via INTRAMUSCULAR

## 2016-04-02 NOTE — Patient Instructions (Signed)
  1. Depo-Medrol 40 IM delivered in clinic today  2. Augmentin 875 one tablet twice a day for the next 20 days  3. Continue Qvar 80 2 inhalations twice a day, Nasacort one spray each nostril 3-7 times per week, montelukast 10 mg daily  4. Continue Xolair and EpiPen  5. Continue omeprazole 20 mg a day  6. Continue Proventil HFA if needed  7. Further treatment?  8. Return to clinic in November 2017 or earlier if problem  9. Obtain fall flu vaccine

## 2016-04-02 NOTE — Progress Notes (Signed)
Follow-up Note  Referring Provider: Allyne Gee, MD Primary Provider: Allyne Gee, MD Date of Office Visit: 04/02/2016  Subjective:   Anne Kerr (DOB: 02-12-1951) is a 65 y.o. female who returns to the [facility] on 04/02/2016 in re-evaluation of the following:  HPI: Brunette presents to this clinic with a 2 week history of sneezing and rhinorrhea and nasal congestion and whenever she performs her nasal wash she gets out yellow nasal material. Her head feels full she has postnasal drip and she has coughing. Her tinnitus is been worse since she has contracted this issue. She is not been able to smell for years.  Prior to this episode she was doing quite well and did not require any antibiotics or systemic steroids since I've last seen her in his clinic in March 2017. She consistently uses her Xolair and her Qvar and continues on a nasal steroid and montelukast. Her reflux is been under excellent control while using her omeprazole.    Medication List           amLODipine 5 MG tablet  Commonly known as:  NORVASC  Take 5 mg by mouth daily.     BD DISP NEEDLES 25G X 5/8" Misc  Generic drug:  NEEDLE (DISP) 25 G  USE AS DIRECTED TO ADMINISTER XOLAIR     beclomethasone 80 MCG/ACT inhaler  Commonly known as:  QVAR  Inhale 2 puffs into the lungs 2 (two) times daily.     EPINEPHrine 0.3 mg/0.3 mL Soaj injection  Commonly known as:  EPIPEN 2-PAK  Inject 0.3 mLs (0.3 mg total) into the muscle as needed.     lactobacillus acidophilus & bulgar chewable tablet  Chew 1 tablet by mouth 2 (two) times daily.     montelukast 10 MG tablet  Commonly known as:  SINGULAIR  Take 10 mg by mouth at bedtime.     NASACORT AQ 55 MCG/ACT Aero nasal inhaler  Generic drug:  triamcinolone  Place 1 spray into the nose 3 (three) times a week.     NASACORT AQ NA  Place 2 sprays into the nose 2 (two) times daily.     omeprazole 20 MG capsule  Commonly known as:  PRILOSEC    Take 20 mg by mouth as needed.     PROAIR HFA 108 (90 Base) MCG/ACT inhaler  Generic drug:  albuterol  Inhale 2 puffs into the lungs every 4 (four) hours as needed for wheezing or shortness of breath.     albuterol (2.5 MG/3ML) 0.083% nebulizer solution  Commonly known as:  PROVENTIL  Take 3 mLs (2.5 mg total) by nebulization every 4 (four) hours as needed for wheezing or shortness of breath.     sterile water (preservative free) injection  RECONSTITUTE EACH 150MG  VIAL OF XOLAIR WITH 1.4ML OF STERILE WATER. STORE AT ROOM TEMPERATURE. DISCARD AFTER USE.     vitamin B-12 1000 MCG tablet  Commonly known as:  CYANOCOBALAMIN  Take 1,000 mcg by mouth daily.     VITAMIN D-3 PO  Take 800 mg by mouth daily.     XOLAIR 150 MG injection  Generic drug:  omalizumab  Inject 150 mg as directed every 30 (thirty) days.        Past Medical History  Diagnosis Date  . Asthma     History reviewed. No pertinent past surgical history.  Allergies  Allergen Reactions  . Aspirin Other (See Comments)    Wheeze  . Ibuprofen  Shortness Of Breath, Other (See Comments) and Cough    Wheeze  . Shellfish Allergy Other (See Comments)    Asthma Symptoms  . Other Swelling    Bee Sting    Review of systems negative except as noted in HPI / PMHx or noted below:  Review of Systems  Constitutional: Negative.   HENT: Negative.   Eyes: Negative.   Respiratory: Negative.   Cardiovascular: Negative.   Gastrointestinal: Negative.   Genitourinary: Negative.   Musculoskeletal: Negative.   Skin: Negative.   Neurological: Negative.   Endo/Heme/Allergies: Negative.   Psychiatric/Behavioral: Negative.      Objective:   Filed Vitals:   04/02/16 1047  BP: 148/88  Pulse: 70  Temp: 98.8 F (37.1 C)  Resp: 16          Physical Exam  Constitutional: She is well-developed, well-nourished, and in no distress.  Nasal voice  HENT:  Head: Normocephalic.  Right Ear: External ear and ear canal  normal. Tympanic membrane is scarred.  Left Ear: External ear and ear canal normal. Tympanic membrane is scarred.  Nose: Nose normal. No mucosal edema or rhinorrhea.  Mouth/Throat: Uvula is midline, oropharynx is clear and moist and mucous membranes are normal. No oropharyngeal exudate.  Eyes: Conjunctivae are normal.  Neck: Trachea normal. No tracheal tenderness present. No tracheal deviation present. No thyromegaly present.  Cardiovascular: Normal rate, regular rhythm, S1 normal, S2 normal and normal heart sounds.   No murmur heard. Pulmonary/Chest: Breath sounds normal. No stridor. No respiratory distress. She has no wheezes. She has no rales.  Musculoskeletal: She exhibits no edema.  Lymphadenopathy:       Head (right side): No tonsillar adenopathy present.       Head (left side): No tonsillar adenopathy present.    She has no cervical adenopathy.  Neurological: She is alert. Gait normal.  Skin: No rash noted. She is not diaphoretic. No erythema. Nails show no clubbing.  Psychiatric: Mood and affect normal.    Diagnostics:    Spirometry was performed and demonstrated an FEV1 of 1.33 at 71 % of predicted.  The patient had an Asthma Control Test with the following results:  .    Assessment and Plan:   1. Asthma, moderate persistent, uncomplicated   2. Allergic rhinoconjunctivitis   3. Chronic sinusitis, unspecified location   4. Chronic mastoiditis, unspecified laterality     1. Depo-Medrol 40 IM delivered in clinic today  2. Augmentin 875 one tablet twice a day for the next 20 days  3. Continue Qvar 80 2 inhalations twice a day, Nasacort one spray each nostril 3-7 times per week, montelukast 10 mg daily  4. Continue Xolair and EpiPen  5. Continue omeprazole 20 mg a day  6. Continue Proventil HFA if needed  7. Further treatment?  8. Return to clinic in November 2017 or earlier if problem  9. Obtain fall flu vaccine  [name] should do well with the therapy mentioned  above which includes prolonged antibiotic administration and relatively low dose systemic steroids to help what appears to be a flare of her chronic sinusitis and respiratory tract inflammation. She will contact me should she have significant problems. Otherwise, we'll see her back in this clinic in November 2017 or earlier if there is problem.  Allena Katz, MD [place]

## 2016-04-11 ENCOUNTER — Ambulatory Visit (INDEPENDENT_AMBULATORY_CARE_PROVIDER_SITE_OTHER): Payer: Federal, State, Local not specified - PPO | Admitting: *Deleted

## 2016-04-11 DIAGNOSIS — J4540 Moderate persistent asthma, uncomplicated: Secondary | ICD-10-CM

## 2016-04-15 ENCOUNTER — Encounter: Payer: Self-pay | Admitting: Allergy and Immunology

## 2016-04-30 ENCOUNTER — Encounter (HOSPITAL_COMMUNITY): Payer: Self-pay | Admitting: Emergency Medicine

## 2016-04-30 ENCOUNTER — Emergency Department (HOSPITAL_COMMUNITY): Payer: Federal, State, Local not specified - PPO

## 2016-04-30 ENCOUNTER — Emergency Department (HOSPITAL_COMMUNITY)
Admission: EM | Admit: 2016-04-30 | Discharge: 2016-04-30 | Disposition: A | Discharge status: Home / Self Care | Payer: Federal, State, Local not specified - PPO | Attending: Emergency Medicine | Admitting: Emergency Medicine

## 2016-04-30 DIAGNOSIS — J45909 Unspecified asthma, uncomplicated: Secondary | ICD-10-CM | POA: Insufficient documentation

## 2016-04-30 DIAGNOSIS — K219 Gastro-esophageal reflux disease without esophagitis: Secondary | ICD-10-CM | POA: Diagnosis not present

## 2016-04-30 DIAGNOSIS — R079 Chest pain, unspecified: Secondary | ICD-10-CM | POA: Diagnosis present

## 2016-04-30 LAB — CBC
HCT: 41 % (ref 36.0–46.0)
HEMOGLOBIN: 13.1 g/dL (ref 12.0–15.0)
MCH: 27.2 pg (ref 26.0–34.0)
MCHC: 32 g/dL (ref 30.0–36.0)
MCV: 85.2 fL (ref 78.0–100.0)
PLATELETS: 201 10*3/uL (ref 150–400)
RBC: 4.81 MIL/uL (ref 3.87–5.11)
RDW: 13.9 % (ref 11.5–15.5)
WBC: 8.2 10*3/uL (ref 4.0–10.5)

## 2016-04-30 LAB — I-STAT TROPONIN, ED
TROPONIN I, POC: 0 ng/mL (ref 0.00–0.08)
Troponin i, poc: 0.01 ng/mL (ref 0.00–0.08)

## 2016-04-30 LAB — URINE MICROSCOPIC-ADD ON

## 2016-04-30 LAB — URINALYSIS, ROUTINE W REFLEX MICROSCOPIC
BILIRUBIN URINE: NEGATIVE
Glucose, UA: NEGATIVE mg/dL
Hgb urine dipstick: NEGATIVE
KETONES UR: NEGATIVE mg/dL
NITRITE: NEGATIVE
PH: 6 (ref 5.0–8.0)
PROTEIN: NEGATIVE mg/dL
Specific Gravity, Urine: 1.013 (ref 1.005–1.030)

## 2016-04-30 LAB — BASIC METABOLIC PANEL
ANION GAP: 7 (ref 5–15)
BUN: 13 mg/dL (ref 6–20)
CALCIUM: 8.9 mg/dL (ref 8.9–10.3)
CHLORIDE: 106 mmol/L (ref 101–111)
CO2: 24 mmol/L (ref 22–32)
CREATININE: 0.95 mg/dL (ref 0.44–1.00)
GFR calc non Af Amer: >60 mL/min (ref >60)
Glucose, Bld: 125 mg/dL — ABNORMAL HIGH (ref 65–99)
Potassium: 4.1 mmol/L (ref 3.5–5.1)
SODIUM: 137 mmol/L (ref 135–145)

## 2016-04-30 MED ORDER — PANTOPRAZOLE SODIUM 20 MG PO TBEC: 20.0000 mg | DELAYED_RELEASE_TABLET | Freq: Every day | ORAL | 0 refills | Status: DC

## 2016-04-30 MED ORDER — GI COCKTAIL ~~LOC~~
30.0000 mL | Freq: Once | ORAL | Status: AC
  Administered 2016-04-30: 30 mL via ORAL
  Filled 2016-04-30: qty 30

## 2016-04-30 NOTE — Discharge Instructions (Signed)
Stop your omeprazole. Start taking Protonix as prescribed.  Return to the emergency department if your symptoms significant only worsen or change.

## 2016-04-30 NOTE — ED Triage Notes (Signed)
Pt. reports upper chest pain with SOB and mild nausea onset this evening , denies emesis or diaphoresis , pt. added low back pain and constipation this week .

## 2016-04-30 NOTE — ED Provider Notes (Signed)
[facility] DEPT Provider Note   CSN: [id number] Arrival date & time: 04/30/16  0031  First Provider Contact:  None       History   Chief Complaint Chief Complaint  Patient presents with  . Chest Pain  . Back Pain    HPI Anne Kerr is a 65 y.o. female.  Patient is a 65 year old female with past medical history of asthma, GERD. She presents for evaluation of burning in her chest and throat that started while sleeping this evening. She woke from sleep the above symptoms. There is no associated shortness of breath, nausea, or diaphoresis. She attempted to drink water and this did help somewhat. She denies any fevers or chills. She denies any bloody vomit or stool. She denies any recent exertional symptoms. She has no cardiac risk factors.    Past Medical History:  Diagnosis Date  . Asthma     Patient Active Problem List   Diagnosis Date Noted  . Severe persistent asthma with exacerbation 07/25/2015  . Acute sinusitis 07/25/2015  . Severe persistent asthma 06/02/2015  . Allergic rhinoconjunctivitis 06/02/2015  . Bee sting reaction 06/02/2015  . GERD (gastroesophageal reflux disease) 06/02/2015  . 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 Chronic sinusitis 06/02/2015  . Chronic mastoiditis 06/02/2015    History reviewed. No pertinent surgical history.  OB History    No data available       Home Medications    Prior to Admission medications   Medication Sig Start Date End Date Taking? Authorizing Provider  albuterol (PROAIR HFA) 108 (90 BASE) MCG/ACT inhaler Inhale 2 puffs into the lungs every 4 (four) hours as needed for wheezing or shortness of breath.    Historical Provider, MD  albuterol (PROVENTIL) (2.5 MG/3ML) 0.083% nebulizer solution Take 3 mLs (2.5 mg total) by nebulization every 4 (four) hours as needed for wheezing or shortness of breath. 08/14/15   Adelina Mings, MD  amLODipine (NORVASC) 5 MG tablet Take 5 mg by mouth daily.     Historical Provider, MD  amoxicillin-clavulanate (AUGMENTIN) 875-125 MG tablet Take 1 tablet by mouth 2 (two) times daily. 04/02/16   Jiles Prows, MD  BD DISP NEEDLES 25G X 5/8" MISC USE AS DIRECTED TO ADMINISTER Arvid Right 11/13/15   Jiles Prows, MD  beclomethasone (QVAR) 80 MCG/ACT inhaler Inhale 2 puffs into the lungs 2 (two) times daily. 12/26/15   Jiles Prows, MD  Cholecalciferol (VITAMIN D-3 PO) Take 800 mg by mouth daily.    Historical Provider, MD  EPINEPHrine (EPIPEN 2-PAK) 0.3 mg/0.3 mL IJ SOAJ injection Inject 0.3 mLs (0.3 mg total) into the muscle as needed. 07/25/15   Adelina Mings, MD  lactobacillus acidophilus & bulgar (LACTINEX) chewable tablet Chew 1 tablet by mouth 2 (two) times daily. 06/08/15   Historical Provider, MD  montelukast (SINGULAIR) 10 MG tablet Take 10 mg by mouth at bedtime.    Historical Provider, MD  omeprazole (PRILOSEC) 20 MG capsule Take 20 mg by mouth as needed.    Historical Provider, MD  triamcinolone (NASACORT AQ) 55 MCG/ACT AERO nasal inhaler Place 1 spray into the nose 3 (three) times a week.    Historical Provider, MD  Triamcinolone Acetonide (NASACORT AQ NA) Place 2 sprays into the nose 2 (two) times daily.    Historical Provider, MD  vitamin B-12 (CYANOCOBALAMIN) 1000 MCG tablet Take 1,000 mcg by mouth daily.    Historical Provider, MD  Water For Injection Sterile (STERILE WATER, PRESERVATIVE FREE,) injection  RECONSTITUTE EACH 150MG  VIAL OF Arvid Right WITH 1.4ML OF STERILE WATER. STORE AT ROOM TEMPERATURE. DISCARD AFTER USE. 11/13/15   Jiles Prows, MD  XOLAIR 150 MG injection Inject 150 mg as directed every 30 (thirty) days. 06/04/15   Historical Provider, MD    Family History Family History  Problem Relation Age of Onset  . Allergic rhinitis Neg Hx   . Angioedema Neg Hx   . Asthma Neg Hx   . Atopy Neg Hx   . Eczema Neg Hx   . Immunodeficiency Neg Hx   . Urticaria Neg Hx     Social History Social History  Substance Use Topics  . Smoking  status: Never Smoker  . Smokeless tobacco: Never Used  . Alcohol use No     Allergies   Aspirin; Ibuprofen; Shellfish allergy; and Other   Review of Systems Review of Systems  All other systems reviewed and are negative.    Physical Exam Updated Vital Signs BP 133/80   Pulse 64   Temp 98.1 F (36.7 C) (Oral)   Resp 16   SpO2 100%   Physical Exam  Constitutional: She is oriented to person, place, and time. She appears well-developed and well-nourished. No distress.  HENT:  Head: Normocephalic and atraumatic.  Neck: Normal range of motion. Neck supple.  Cardiovascular: Normal rate and regular rhythm.  Exam reveals no gallop and no friction rub.   No murmur heard. Pulmonary/Chest: Effort normal and breath sounds normal. No respiratory distress. She has no wheezes.  Abdominal: Soft. Bowel sounds are normal. She exhibits no distension. There is no tenderness.  Musculoskeletal: Normal range of motion.  Neurological: She is alert and oriented to person, place, and time.  Skin: Skin is warm and dry. She is not diaphoretic.  Nursing note and vitals reviewed.    ED Treatments / Results  Labs (all labs ordered are listed, but only abnormal results are displayed) Labs Reviewed  BASIC METABOLIC PANEL - Abnormal; Notable for the following:       Result Value   Glucose, Bld 125 (*)    All other components within normal limits  URINALYSIS, ROUTINE W REFLEX MICROSCOPIC (NOT AT [facility]) - Abnormal; Notable for the following:    Color, Urine STRAW (*)    Leukocytes, UA SMALL (*)    All other components within normal limits  URINE MICROSCOPIC-ADD ON - Abnormal; Notable for the following:    Squamous Epithelial / LPF 0-5 (*)    Bacteria, UA RARE (*)    All other components within normal limits  CBC  I-STAT TROPOININ, ED  I-STAT TROPOININ, ED    EKG  EKG Interpretation  Date/Time:  Wednesday April 30 2016 00:40:03 EDT Ventricular Rate:  99 PR Interval:  142 QRS  Duration: 82 QT Interval:  358 QTC Calculation: 459 R Axis:   -16 Text Interpretation:  Normal sinus rhythm Cannot rule out Anterior infarct , age undetermined Abnormal ECG Confirmed by Elden Brucato  MD, Anae Hams ([id number]) on 04/30/2016 5:33:18 AM       Radiology Dg Chest 2 View  Result Date: 04/30/2016 CLINICAL DATA:  Upper chest pain and shortness of breath. Nausea. Symptom onset this evening. EXAM: CHEST  2 VIEW COMPARISON:  08/05/2010 FINDINGS: The cardiomediastinal contours are normal. Borderline hyperinflation and central bronchial thickening. Pulmonary vasculature is normal. No consolidation, pleural effusion, or pneumothorax. No acute osseous abnormalities are seen. Focal sclerosis involving the right fifth rib is likely a bone island and unchanged. IMPRESSION: Borderline hyperinflation and central  bronchial thickening, may be bronchitis or asthma. Electronically Signed   By: Jeb Levering M.D.   On: 04/30/2016 01:12    Procedures Procedures (including critical care time)  Medications Ordered in ED Medications  gi cocktail (Maalox,Lidocaine,Donnatal) (not administered)     Initial Impression / Assessment and Plan / ED Course  I have reviewed the triage vital signs and the nursing notes.  Pertinent labs & imaging results that were available during my care of the patient were reviewed by me and considered in my medical decision making (see chart for details).  Clinical Course      Final Clinical Impressions(s) / ED Diagnoses   Final diagnoses:  None   Patient presents with complaints of burning in her abdomen and chest that started while she was sleeping this evening. She has a history of acid reflux, however this somehow seem different. She denies any shortness of breath, diaphoresis, or nausea.  Her workup reveals an unchanged EKG, negative troponin 2, and improvement with a GI cocktail. I highly suspect her symptoms are related to GERD and we'll treat her with protonix. She is  currently taking omeprazole  New Prescriptions New Prescriptions   No medications on file     Veryl Speak, MD 04/30/16 [phone]

## 2016-05-08 ENCOUNTER — Ambulatory Visit (INDEPENDENT_AMBULATORY_CARE_PROVIDER_SITE_OTHER): Payer: Federal, State, Local not specified - PPO | Admitting: *Deleted

## 2016-05-08 DIAGNOSIS — J4540 Moderate persistent asthma, uncomplicated: Secondary | ICD-10-CM | POA: Diagnosis not present

## 2016-05-30 ENCOUNTER — Other Ambulatory Visit: Payer: Self-pay | Admitting: Allergy and Immunology

## 2016-05-30 DIAGNOSIS — J4551 Severe persistent asthma with (acute) exacerbation: Secondary | ICD-10-CM

## 2016-06-09 ENCOUNTER — Ambulatory Visit: Payer: Federal, State, Local not specified - PPO

## 2016-06-16 ENCOUNTER — Ambulatory Visit (INDEPENDENT_AMBULATORY_CARE_PROVIDER_SITE_OTHER): Payer: Federal, State, Local not specified - PPO

## 2016-06-16 DIAGNOSIS — J4540 Moderate persistent asthma, uncomplicated: Secondary | ICD-10-CM | POA: Diagnosis not present

## 2016-06-29 ENCOUNTER — Other Ambulatory Visit: Payer: Self-pay | Admitting: Allergy and Immunology

## 2016-06-29 DIAGNOSIS — J4551 Severe persistent asthma with (acute) exacerbation: Secondary | ICD-10-CM

## 2016-07-09 DIAGNOSIS — R52 Pain, unspecified: Secondary | ICD-10-CM

## 2016-07-09 DIAGNOSIS — M720 Palmar fascial fibromatosis [Dupuytren]: Secondary | ICD-10-CM | POA: Insufficient documentation

## 2016-07-09 HISTORY — DX: Pain, unspecified: R52

## 2016-07-21 ENCOUNTER — Ambulatory Visit (INDEPENDENT_AMBULATORY_CARE_PROVIDER_SITE_OTHER): Payer: Federal, State, Local not specified - PPO | Admitting: *Deleted

## 2016-07-21 DIAGNOSIS — J4540 Moderate persistent asthma, uncomplicated: Secondary | ICD-10-CM

## 2016-08-05 ENCOUNTER — Ambulatory Visit (INDEPENDENT_AMBULATORY_CARE_PROVIDER_SITE_OTHER): Payer: Federal, State, Local not specified - PPO | Admitting: Allergy and Immunology

## 2016-08-05 ENCOUNTER — Encounter: Payer: Self-pay | Admitting: Allergy and Immunology

## 2016-08-05 ENCOUNTER — Encounter (INDEPENDENT_AMBULATORY_CARE_PROVIDER_SITE_OTHER): Payer: Self-pay

## 2016-08-05 VITALS — BP 122/80 | HR 72 | Resp 20

## 2016-08-05 DIAGNOSIS — J4540 Moderate persistent asthma, uncomplicated: Secondary | ICD-10-CM | POA: Diagnosis not present

## 2016-08-05 DIAGNOSIS — K219 Gastro-esophageal reflux disease without esophagitis: Secondary | ICD-10-CM

## 2016-08-05 DIAGNOSIS — J3089 Other allergic rhinitis: Secondary | ICD-10-CM | POA: Diagnosis not present

## 2016-08-05 NOTE — Patient Instructions (Addendum)
  1. Continue Qvar 80 2 inhalations twice a day, Nasacort one spray each nostril 3-7 times per week, montelukast 10 mg daily  2. Continue Xolair and EpiPen  3. Continue omeprazole 20 mg a day  4. Continue Proventil HFA if needed  5. Return to clinic in 6 months or earlier if problem

## 2016-08-05 NOTE — Progress Notes (Signed)
Follow-up Note  Referring Provider: Allyne Gee, MD Primary Provider: Allyne Gee, MD Date of Office Visit: 08/05/2016  Subjective:   Anne Kerr (DOB: 09-27-1951) is a 65 y.o. female who returns to the [facility] on 08/05/2016 in re-evaluation of the following:  HPI: Lilly return to this clinic in reevaluation of her asthma treated with omalizumab and allergic rhinoconjunctivitis and ETD. I've not seen her in his clinic since July 2017.  During the interval she has done wonderful with her airway. She has not required either a systemic steroid or an antibiotic to treat any problem with her upper or lower airway or her years. She does not use a short acting bronchodilator and she can exert herself without any problem. She has not been able to smell for years. Her ears appear to be doing relatively well without a significant amount of tinnitus or dizziness or hearing loss.  Her reflux is under excellent control at this point in time.  She did apparently have a problem with her back. She's been having a "pinched nerve" down her left leg and was placed on prednisone yesterday and had an MRI results unknown at this point in time.    Medication List      amLODipine 5 MG tablet Commonly known as:  NORVASC Take 5 mg by mouth daily.   BD DISP NEEDLES 25G X 5/8" Misc Generic drug:  NEEDLE (DISP) 25 G USE AS DIRECTED TO ADMINISTER XOLAIR   beclomethasone 80 MCG/ACT inhaler Commonly known as:  QVAR Inhale 2 puffs into the lungs 2 (two) times daily.   EPINEPHrine 0.3 mg/0.3 mL Soaj injection Commonly known as:  EPIPEN 2-PAK Inject 0.3 mLs (0.3 mg total) into the muscle as needed.   montelukast 10 MG tablet Commonly known as:  SINGULAIR Take 10 mg by mouth at bedtime.   NASACORT AQ NA Place 2 sprays into the nose 2 (two) times daily.   pantoprazole 20 MG tablet Commonly known as:  PROTONIX Take 1 tablet (20 mg total) by mouth daily.   PROAIR HFA 108  (90 Base) MCG/ACT inhaler Generic drug:  albuterol Inhale 2 puffs into the lungs every 4 (four) hours as needed for wheezing or shortness of breath.   albuterol (2.5 MG/3ML) 0.083% nebulizer solution Commonly known as:  PROVENTIL Take 3 mLs (2.5 mg total) by nebulization every 4 (four) hours as needed for wheezing or shortness of breath.   vitamin B-12 1000 MCG tablet Commonly known as:  CYANOCOBALAMIN Take 1,000 mcg by mouth daily.   VITAMIN D-3 PO Take 800 mg by mouth daily.       Past Medical History:  Diagnosis Date  . Asthma     History reviewed. No pertinent surgical history.  Allergies  Allergen Reactions  . Aspirin Other (See Comments)    Wheeze  . Ibuprofen Shortness Of Breath, Other (See Comments) and Cough    Wheeze  . Shellfish Allergy Other (See Comments)    Asthma Symptoms  . Other Swelling    Bee Sting    Review of systems negative except as noted in HPI / PMHx or noted below:  Review of Systems  Constitutional: Negative.   HENT: Negative.   Eyes: Negative.   Respiratory: Negative.   Cardiovascular: Negative.   Gastrointestinal: Negative.   Genitourinary: Negative.   Musculoskeletal: Negative.   Skin: Negative.   Neurological: Negative.   Endo/Heme/Allergies: Negative.   Psychiatric/Behavioral: Negative.      Objective:  Vitals:   08/05/16 1019  BP: 122/80  Pulse: 72  Resp: 20          Physical Exam  Constitutional: She is well-developed, well-nourished, and in no distress.  HENT:  Head: Normocephalic.  Right Ear: External ear and ear canal normal. Tympanic membrane is scarred.  Left Ear: External ear and ear canal normal. Tympanic membrane is scarred.  Nose: Nose normal. No mucosal edema or rhinorrhea.  Mouth/Throat: Uvula is midline, oropharynx is clear and moist and mucous membranes are normal. No oropharyngeal exudate.  Eyes: Conjunctivae are normal.  Neck: Trachea normal. No tracheal tenderness present. No tracheal  deviation present. No thyromegaly present.  Cardiovascular: Normal rate, regular rhythm, S1 normal, S2 normal and normal heart sounds.   No murmur heard. Pulmonary/Chest: Breath sounds normal. No stridor. No respiratory distress. She has no wheezes. She has no rales.  Musculoskeletal: She exhibits no edema.  Lymphadenopathy:       Head (right side): No tonsillar adenopathy present.       Head (left side): No tonsillar adenopathy present.    She has no cervical adenopathy.  Neurological: She is alert. Gait normal.  Skin: No rash noted. She is not diaphoretic. No erythema. Nails show no clubbing.  Psychiatric: Mood and affect normal.    Diagnostics:    Spirometry was performed and demonstrated an FEV1 of 1.33 at 70 % of predicted.  Assessment and Plan:   1. Moderate persistent asthma, uncomplicated   2. Other allergic rhinitis   3. Gastroesophageal reflux disease, esophagitis presence not specified     1. Continue Qvar 80 2 inhalations twice a day, Nasacort one spray each nostril 3-7 times per week, montelukast 10 mg daily  2. Continue Xolair and EpiPen  3. Continue omeprazole 20 mg a day  4. Continue Proventil HFA if needed  5. Return to clinic in 6 months or earlier if problem  Lilly appears to be doing relatively well and I will continue to have her use anti-inflammatory agents for both her upper and lower airways as noted above and continue on omalizumab. In addition, she will continue therapy for reflux as noted above. I will see her back in this clinic in 6 months or earlier if there is a problem.  Allena Katz, MD [place]

## 2016-08-18 ENCOUNTER — Ambulatory Visit (INDEPENDENT_AMBULATORY_CARE_PROVIDER_SITE_OTHER): Payer: Federal, State, Local not specified - PPO | Admitting: *Deleted

## 2016-08-18 DIAGNOSIS — J4540 Moderate persistent asthma, uncomplicated: Secondary | ICD-10-CM

## 2016-09-15 ENCOUNTER — Ambulatory Visit (INDEPENDENT_AMBULATORY_CARE_PROVIDER_SITE_OTHER): Payer: Federal, State, Local not specified - PPO | Admitting: *Deleted

## 2016-09-15 ENCOUNTER — Ambulatory Visit: Payer: Medicare Other

## 2016-09-15 DIAGNOSIS — J4540 Moderate persistent asthma, uncomplicated: Secondary | ICD-10-CM | POA: Diagnosis not present

## 2016-09-15 DIAGNOSIS — J4530 Mild persistent asthma, uncomplicated: Secondary | ICD-10-CM

## 2016-10-13 ENCOUNTER — Ambulatory Visit (INDEPENDENT_AMBULATORY_CARE_PROVIDER_SITE_OTHER): Payer: Federal, State, Local not specified - PPO | Admitting: *Deleted

## 2016-10-13 DIAGNOSIS — J4540 Moderate persistent asthma, uncomplicated: Secondary | ICD-10-CM | POA: Diagnosis not present

## 2016-10-13 DIAGNOSIS — J4530 Mild persistent asthma, uncomplicated: Secondary | ICD-10-CM

## 2016-11-10 ENCOUNTER — Ambulatory Visit (INDEPENDENT_AMBULATORY_CARE_PROVIDER_SITE_OTHER): Payer: Federal, State, Local not specified - PPO | Admitting: *Deleted

## 2016-11-10 DIAGNOSIS — J4540 Moderate persistent asthma, uncomplicated: Secondary | ICD-10-CM | POA: Diagnosis not present

## 2016-11-18 ENCOUNTER — Other Ambulatory Visit: Payer: Self-pay | Admitting: *Deleted

## 2016-11-18 MED ORDER — OMALIZUMAB 150 MG ~~LOC~~ SOLR: 150.0000 mg | SUBCUTANEOUS | 1 refills | Status: DC

## 2016-12-08 ENCOUNTER — Ambulatory Visit: Payer: Self-pay

## 2016-12-08 ENCOUNTER — Encounter: Payer: Self-pay | Admitting: Allergy & Immunology

## 2016-12-08 ENCOUNTER — Ambulatory Visit (INDEPENDENT_AMBULATORY_CARE_PROVIDER_SITE_OTHER): Payer: Federal, State, Local not specified - PPO | Admitting: Allergy & Immunology

## 2016-12-08 ENCOUNTER — Encounter (INDEPENDENT_AMBULATORY_CARE_PROVIDER_SITE_OTHER): Payer: Self-pay

## 2016-12-08 VITALS — BP 132/76 | HR 90 | Temp 98.7°F | Resp 18

## 2016-12-08 DIAGNOSIS — K219 Gastro-esophageal reflux disease without esophagitis: Secondary | ICD-10-CM

## 2016-12-08 DIAGNOSIS — J4541 Moderate persistent asthma with (acute) exacerbation: Secondary | ICD-10-CM | POA: Diagnosis not present

## 2016-12-08 DIAGNOSIS — J3089 Other allergic rhinitis: Secondary | ICD-10-CM | POA: Diagnosis not present

## 2016-12-08 MED ORDER — RANITIDINE HCL 300 MG PO TABS: 300.0000 mg | ORAL_TABLET | Freq: Every day | ORAL | 5 refills | Status: DC

## 2016-12-08 MED ORDER — METHYLPREDNISOLONE ACETATE 80 MG/ML IJ SUSP
80.0000 mg | Freq: Once | INTRAMUSCULAR | Status: AC
  Administered 2016-12-08: 80 mg via INTRAMUSCULAR

## 2016-12-08 NOTE — Progress Notes (Signed)
FOLLOW UP  Date of Service/Encounter:  12/08/16   Assessment:   Moderate persistent asthma with acute exacerbation  Allergic rhinitis  Gastroesophageal reflux disease   Asthma Reportables:  Severity: moderate persistent  Risk: high Control: well controlled   Plan/Recommendations:   1. Moderate persistent asthma with acute exacerbation - Lung testing was notable for evidence of an asthma attack. - Lung function did improve with DuoNeb nebulizer treatment. - DepoMedrol 80mg  provided in clinic today. - Start the steroid pack provided in clinic today. - We will wait on your Xolair since you are not feeling well today.  - Come back later this week for the Xolair injection.  - Daily controller medication(s): Qvar 12mcg two puffs twice daily with spacer + Singulair 10mg  daily - Rescue medications: ProAir 4 puffs every 4-6 hours as needed - Changes during respiratory infections or worsening symptoms: increase Qvar 61mcg to 4 puffs twice daily for TWO WEEKS. - Asthma control goals:  * Full participation in all desired activities (may need albuterol before activity) * Albuterol use two time or less a week on average (not counting use with activity) * Cough interfering with sleep two time or less a month * Oral steroids no more than once a year * No hospitalizations  2. Allergic rhinitis - Continue with Singulair 10mg  daily.  3. Gastroesophageal reflux disease, esophagitis presence not specified - Continue with Protonix 20mg  once daily.  - Add Zantac 300mg  at night.   4. Return in about 4 weeks (around 01/05/2017).    Subjective:   Anne Kerr is a 66 y.o. female presenting today for follow up of  Chief Complaint  Patient presents with  . Asthma    Chest tightness.  Here for Xolair injection.  . Nasal Congestion    Anne Kerr has a history of the following: Patient Active Problem List   Diagnosis Date Noted  . Severe persistent asthma with exacerbation  07/25/2015  . Acute sinusitis 07/25/2015  . Severe persistent asthma 06/02/2015  . Allergic rhinoconjunctivitis 06/02/2015  . Bee sting reaction 06/02/2015  . GERD (gastroesophageal reflux disease) 06/02/2015  . 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 Chronic sinusitis 06/02/2015  . 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  Anne Kerr was referred by Allyne Gee, MD.     Anne Kerr is a 66 y.o. female presenting for a follow up visit. She was last seen in office visit November 2017. At that time, she was doing very well from her asthma. She had not required systemic steroids or antibiotics. Her reflux was controlled with her current medications. She presented today for his Xolair injection, but was endorsing shortness of breath. Therefore we worked her in as an office visit.  She reports several days of shortness of breath. She is being treated currently for bilateral otitis media by Dr. Ernesto Rutherford. She cannot remember the antibiotics that she is on, but one of them is ciprofloxacin. He still has 4 days left. He has not been using her rescue inhaler. She remains on Qvar 40 g 2 puffs twice daily as well as Singulair 10 mg daily. She has been doing well on monthly Xolair. Her reflux is fairly well controlled with Protonix 20 mg once daily. She denies chest pain in conjunction with her chest tightness. She has remained afebrile.  Anne Kerr feels the [place] has helped her symptoms tremendously. Her last course of prednisone was more than one year ago. She is  very happy with how it is working. She has had no adverse events from the injections. She does have her EpiPen and every injection.   Otherwise, there have been no changes to her past medical history, surgical history, family history, or social history.    Review of Systems: a 14-point review of systems is pertinent for what is mentioned in HPI.  Otherwise, all other systems were  negative. Constitutional: negative other than that listed in the HPI Eyes: negative other than that listed in the HPI Ears, nose, mouth, throat, and face: negative other than that listed in the HPI Respiratory: negative other than that listed in the HPI Cardiovascular: negative other than that listed in the HPI Gastrointestinal: negative other than that listed in the HPI Genitourinary: negative other than that listed in the HPI Integument: negative other than that listed in the HPI Hematologic: negative other than that listed in the HPI Musculoskeletal: negative other than that listed in the HPI Neurological: negative other than that listed in the HPI Allergy/Immunologic: negative other than that listed in the HPI    Objective:   Blood pressure 132/76, pulse 90, temperature 98.7 F (37.1 C), temperature source Oral, resp. rate 18, SpO2 97 %. There is no height or weight on file to calculate BMI.   Physical Exam:  General: Alert, interactive, in no acute distress. Cooperative with the exam. Tachypneic. Eyes: No conjunctival injection present on the right, No conjunctival injection present on the left, PERRL bilaterally, No discharge on the right, No discharge on the left and No Horner-Trantas dots present Ears: Right TM pearly gray with normal light reflex, Left TM pearly gray with normal light reflex, Right TM intact without perforation and Left TM intact without perforation.  Nose/Throat: External nose within normal limits and septum midline, turbinates edematous with clear discharge, post-pharynx mildly erythematous without cobblestoning in the posterior oropharynx. Tonsils 2+ without exudates Neck: Supple without thyromegaly. Lungs: Decreased breath sounds with expiratory wheezing bilaterally. Increased work of breathing. CV: Normal S1/S2, no murmurs. Capillary refill <2 seconds.  Skin: Warm and dry, without lesions or rashes. Neuro:   Grossly intact. No focal deficits  appreciated. Responsive to questions.   Diagnostic studies:  Spirometry: results abnormal (FEV1: 1.05/56%, FVC: 1.49/63%, FEV1/FVC: 70%).    Spirometry consistent with possible restrictive disease. Albuterol/Atrovent nebulizer treatment given in clinic with significant improvement. The forced vital capacity increased 330 mL (22%) and the forced expiratory volume in 1 second increased 410 mL (39%). The FEV1 to FVC ratio increased from 70% to 80% postbronchodilator. This is significant per ATS criteria.  Allergy Studies: none   Salvatore Marvel, MD [facility]

## 2016-12-08 NOTE — Patient Instructions (Addendum)
1. Moderate persistent asthma with acute exacerbation - Lung testing was notable for evidence of an asthma attack. - Lung function did improve with DuoNeb nebulizer treatment. - DepoMedrol 80mg  provided in clinic today. - Start the steroid pack provided in clinic today. - We will wait on your Xolair since you are not feeling well today.  - Come back later this week for the Xolair injection.  - Daily controller medication(s): Qvar 58mcg two puffs twice daily with spacer + Singulair 10mg  daily - Rescue medications: ProAir 4 puffs every 4-6 hours as needed - Changes during respiratory infections or worsening symptoms: increase Qvar 31mcg to 4 puffs twice daily for TWO WEEKS. - Asthma control goals:  * Full participation in all desired activities (may need albuterol before activity) * Albuterol use two time or less a week on average (not counting use with activity) * Cough interfering with sleep two time or less a month * Oral steroids no more than once a year * No hospitalizations  2. Allergic rhinitis - Continue with Singulair 10mg  daily.  3. Gastroesophageal reflux disease, esophagitis presence not specified - Continue with Protonix 20mg  once daily.  - Add Zantac 300mg  at night.   4. Return in about 4 weeks (around 01/05/2017).  Please inform us of any Emergency Department visits, hospitalizations, or changes in symptoms. Call us before going to the ED for breathing or allergy symptoms since we might be able to fit you in for a sick visit. Feel free to contact us anytime with any questions, problems, or concerns.  It was a pleasure to meet you today! Happy spring!   Websites that have reliable patient information: 1. American Academy of Asthma, Allergy, and Immunology: www.aaaai.org 2. Food Allergy Research and Education (FARE): foodallergy.org 3. Mothers of Asthmatics: http://www.asthmacommunitynetwork.org 4. American College of Allergy, Asthma, and Immunology: www.acaai.org

## 2016-12-16 ENCOUNTER — Ambulatory Visit (INDEPENDENT_AMBULATORY_CARE_PROVIDER_SITE_OTHER): Payer: Federal, State, Local not specified - PPO | Admitting: *Deleted

## 2016-12-16 DIAGNOSIS — J4540 Moderate persistent asthma, uncomplicated: Secondary | ICD-10-CM | POA: Diagnosis not present

## 2017-01-06 ENCOUNTER — Ambulatory Visit: Payer: Medicare Other | Admitting: Allergy and Immunology

## 2017-01-13 ENCOUNTER — Ambulatory Visit (INDEPENDENT_AMBULATORY_CARE_PROVIDER_SITE_OTHER): Payer: Federal, State, Local not specified - PPO | Admitting: Allergy and Immunology

## 2017-01-13 ENCOUNTER — Encounter: Payer: Self-pay | Admitting: Allergy and Immunology

## 2017-01-13 ENCOUNTER — Ambulatory Visit: Payer: Medicare Other

## 2017-01-13 VITALS — BP 130/74 | HR 72 | Resp 20

## 2017-01-13 DIAGNOSIS — J4551 Severe persistent asthma with (acute) exacerbation: Secondary | ICD-10-CM | POA: Diagnosis not present

## 2017-01-13 DIAGNOSIS — K219 Gastro-esophageal reflux disease without esophagitis: Secondary | ICD-10-CM | POA: Diagnosis not present

## 2017-01-13 DIAGNOSIS — J4550 Severe persistent asthma, uncomplicated: Secondary | ICD-10-CM | POA: Diagnosis not present

## 2017-01-13 DIAGNOSIS — J3089 Other allergic rhinitis: Secondary | ICD-10-CM | POA: Diagnosis not present

## 2017-01-13 MED ORDER — FLUTICASONE PROPIONATE HFA 110 MCG/ACT IN AERO: 2.0000 | INHALATION_SPRAY | Freq: Two times a day (BID) | RESPIRATORY_TRACT | 5 refills | Status: DC

## 2017-01-13 MED ORDER — BUDESONIDE-FORMOTEROL FUMARATE 160-4.5 MCG/ACT IN AERO: 2.0000 | INHALATION_SPRAY | Freq: Two times a day (BID) | RESPIRATORY_TRACT | 5 refills | Status: DC

## 2017-01-13 NOTE — Patient Instructions (Addendum)
  1. Start Symbicort 160 - two inhalations two times per day with spacer  2. Continue Flovent 110 - two inhalation 2 times per day with spacer  3. Continue Nasacort one spray each nostril 3-7 times per week but hold off on using right nostril for 10 days  4. Continue montelukast 10 mg daily  5. Continue Xolair and EpiPen  6. Continue omeprazole 20 mg a day and can add Ranitidine 300mg  one time per day  7. Continue Proventil HFA if needed  8. Return to clinic in 12 weeks or earlier if problem

## 2017-01-13 NOTE — Progress Notes (Signed)
Follow-up Note  Referring Provider: Allyne Gee, MD Primary Provider: Allyne Gee, MD Date of Office Visit: 01/13/2017  Subjective:   Anne Kerr (DOB: Feb 13, 1951) is a 66 y.o. female who returns to the [facility] on 01/13/2017 in re-evaluation of the following:  HPI: Latima presents to this clinic in reevaluation of her asthma and allergic rhinitis and reflux. I have not seen her in this clinic since November 2017.  She did visit with Dr. Nelva Bush in March for an exacerbation of her asthma and upper airway disease. She was treated with systemic steroids and apparently had been treated with an antibiotic prior to that point in time by her ENT doctor. She has for the most part resolved all of her upper airway symptoms yet continues to have some issues with wheezing and coughing. As well, she has developed epistaxis affecting her right nostril.  Allergies as of 01/13/2017      Reactions   Aspirin Other (See Comments)   Wheeze   Ibuprofen Shortness Of Breath, Other (See Comments), Cough   Wheeze   Shellfish Allergy Other (See Comments)   Asthma Symptoms   Other Swelling   Bee Sting      Medication List      amLODipine 5 MG tablet Commonly known as:  NORVASC Take 5 mg by mouth daily.   BD DISP NEEDLES 25G X 5/8" Misc Generic drug:  NEEDLE (DISP) 25 G USE AS DIRECTED TO ADMINISTER XOLAIR   beclomethasone 80 MCG/ACT inhaler Commonly known as:  QVAR Inhale 2 puffs into the lungs 2 (two) times daily.   EPINEPHrine 0.3 mg/0.3 mL Soaj injection Commonly known as:  EPIPEN 2-PAK Inject 0.3 mLs (0.3 mg total) into the muscle as needed.   montelukast 10 MG tablet Commonly known as:  SINGULAIR Take 10 mg by mouth at bedtime.   NASACORT AQ NA Place 2 sprays into the nose 2 (two) times daily.   pantoprazole 20 MG tablet Commonly known as:  PROTONIX Take 1 tablet (20 mg total) by mouth daily.   PROAIR HFA 108 (90 Base) MCG/ACT inhaler Generic drug:   albuterol Inhale 2 puffs into the lungs every 4 (four) hours as needed for wheezing or shortness of breath.   albuterol (2.5 MG/3ML) 0.083% nebulizer solution Commonly known as:  PROVENTIL Take 3 mLs (2.5 mg total) by nebulization every 4 (four) hours as needed for wheezing or shortness of breath.   ranitidine 300 MG tablet Commonly known as:  ZANTAC Take 1 tablet (300 mg total) by mouth at bedtime.   vitamin B-12 1000 MCG tablet Commonly known as:  CYANOCOBALAMIN Take 1,000 mcg by mouth daily.   VITAMIN D-3 PO Take 800 mg by mouth daily.   ZYRTEC ALLERGY 10 MG tablet Generic drug:  cetirizine Take 10 mg by mouth daily.       Past Medical History:  Diagnosis Date  . Asthma     History reviewed. No pertinent surgical history.  Review of systems negative except as noted in HPI / PMHx or noted below:  Review of Systems  Constitutional: Negative.   HENT: Negative.   Eyes: Negative.   Respiratory: Negative.   Cardiovascular: Negative.   Gastrointestinal: Negative.   Genitourinary: Negative.   Musculoskeletal: Negative.   Skin: Negative.   Neurological: Negative.   Endo/Heme/Allergies: Negative.   Psychiatric/Behavioral: Negative.      Objective:   Vitals:   01/13/17 0902  BP: 130/74  Pulse: 72  Resp: 20  Physical Exam  Constitutional: She is well-developed, well-nourished, and in no distress.  HENT:  Head: Normocephalic.  Right Ear: Tympanic membrane, external ear and ear canal normal.  Left Ear: Tympanic membrane, external ear and ear canal normal.  Nose: Nose normal. No mucosal edema or rhinorrhea.  Mouth/Throat: Uvula is midline, oropharynx is clear and moist and mucous membranes are normal. No oropharyngeal exudate.  Eyes: Conjunctivae are normal.  Neck: Trachea normal. No tracheal tenderness present. No tracheal deviation present. No thyromegaly present.  Cardiovascular: Normal rate, regular rhythm, S1 normal, S2 normal and normal heart  sounds.   No murmur heard. Pulmonary/Chest: No stridor. No respiratory distress. She has wheezes. She has no rales.  Musculoskeletal: She exhibits no edema.  Lymphadenopathy:       Head (right side): No tonsillar adenopathy present.       Head (left side): No tonsillar adenopathy present.    She has no cervical adenopathy.  Neurological: She is alert. Gait normal.  Skin: No rash noted. She is not diaphoretic. No erythema. Nails show no clubbing.  Psychiatric: Mood and affect normal.    Diagnostics:    Spirometry was performed and demonstrated an FEV1 of 1.23 at 68 % of predicted.  Assessment and Plan:   1. Severe persistent asthma with exacerbation   2. Other allergic rhinitis   3. Gastroesophageal reflux disease, esophagitis presence not specified     1. Start Symbicort 160 - two inhalations two times per day with spacer  2. Continue QVAR 80 - two inhalation 2 times per day with spacer  3. Continue Nasacort one spray each nostril 3-7 times per week but hold off on using right nostril for 10 days  4. Continue montelukast 10 mg daily  5. Continue Xolair and EpiPen  6. Continue omeprazole 20 mg a day and can add Ranitidine 300mg  one time per day  7. Continue Proventil HFA if needed  8. Return to clinic in 12 weeks or earlier if problem  Lilly appears to have persistent inflammation of her respiratory tract and I'm going to increase her inhaled steroid dose by introducing Symbicort to her chronic Qvar administration. As well, she's had epistaxis on her right nostril and we will have her avoid using Nasacort for the next 10 days in that area. She will continue on omalizumab administration and also continue to address her reflux issue. I will see her back in this clinic in approximately 12 weeks or earlier if there is a problem.  Allena Katz, MD Allergy / Immunology [place]

## 2017-02-09 ENCOUNTER — Ambulatory Visit (INDEPENDENT_AMBULATORY_CARE_PROVIDER_SITE_OTHER): Payer: Federal, State, Local not specified - PPO

## 2017-02-09 ENCOUNTER — Ambulatory Visit: Payer: Medicare Other

## 2017-02-09 DIAGNOSIS — J4540 Moderate persistent asthma, uncomplicated: Secondary | ICD-10-CM | POA: Diagnosis not present

## 2017-02-10 ENCOUNTER — Ambulatory Visit: Payer: Self-pay

## 2017-03-04 ENCOUNTER — Telehealth: Payer: Self-pay | Admitting: Allergy and Immunology

## 2017-03-04 MED ORDER — MONTELUKAST SODIUM 10 MG PO TABS: 10.0000 mg | ORAL_TABLET | Freq: Every day | ORAL | 3 refills | Status: DC

## 2017-03-04 NOTE — Telephone Encounter (Signed)
Refills sent in patient notified 

## 2017-03-04 NOTE — Telephone Encounter (Signed)
Pt called and said that she needs to have Singulair called into [facility] [phone]

## 2017-03-09 ENCOUNTER — Ambulatory Visit (INDEPENDENT_AMBULATORY_CARE_PROVIDER_SITE_OTHER): Payer: Federal, State, Local not specified - PPO

## 2017-03-09 DIAGNOSIS — J4540 Moderate persistent asthma, uncomplicated: Secondary | ICD-10-CM | POA: Diagnosis not present

## 2017-04-07 ENCOUNTER — Ambulatory Visit: Payer: Self-pay

## 2017-04-07 ENCOUNTER — Ambulatory Visit (INDEPENDENT_AMBULATORY_CARE_PROVIDER_SITE_OTHER): Payer: Federal, State, Local not specified - PPO | Admitting: Allergy and Immunology

## 2017-04-07 VITALS — BP 122/80 | HR 72 | Resp 16

## 2017-04-07 DIAGNOSIS — K219 Gastro-esophageal reflux disease without esophagitis: Secondary | ICD-10-CM | POA: Diagnosis not present

## 2017-04-07 DIAGNOSIS — J4550 Severe persistent asthma, uncomplicated: Secondary | ICD-10-CM | POA: Diagnosis not present

## 2017-04-07 DIAGNOSIS — J3089 Other allergic rhinitis: Secondary | ICD-10-CM

## 2017-04-07 NOTE — Progress Notes (Signed)
Follow-up Note  Referring Provider: Allyne Gee, MD Primary Provider: Allyne Gee, MD Date of Office Visit: 04/07/2017  Subjective:   Anne Kerr (DOB: 02-Dec-1950) is a 66 y.o. female who returns to the [facility] on 04/07/2017 in re-evaluation of the following:  HPI: Delanda returns to this clinic in reevaluation of her asthma and allergic rhinitis and reflux. I last saw her in this clinic in April 2018.  She is really doing very well at this point in time while consistently using Symbicort. It sounds as though she has tapered off her Flovent or Qvar. She does not need to use a short acting bronchodilator and she can exert herself without any problem. She has not required a systemic steroid to treat an exacerbation of asthma.  Her nose is doing quite well at this point in time. She has not been having any bleeding. She has minimal congestion.  Reflux has been under excellent control.  Allergies as of 04/07/2017      Reactions   Aspirin Other (See Comments)   Wheeze   Ibuprofen Shortness Of Breath, Other (See Comments), Cough   Wheeze   Shellfish Allergy Other (See Comments)   Asthma Symptoms   Other Swelling   Bee Sting      Medication List      amLODipine 5 MG tablet Commonly known as:  NORVASC Take 5 mg by mouth daily.   BD DISP NEEDLES 25G X 5/8" Misc Generic drug:  NEEDLE (DISP) 25 G USE AS DIRECTED TO ADMINISTER XOLAIR   budesonide-formoterol 160-4.5 MCG/ACT inhaler Commonly known as:  SYMBICORT Inhale 2 puffs into the lungs 2 (two) times daily.   EPINEPHrine 0.3 mg/0.3 mL Soaj injection Commonly known as:  EPIPEN 2-PAK Inject 0.3 mLs (0.3 mg total) into the muscle as needed.   fluticasone 110 MCG/ACT inhaler Commonly known as:  FLOVENT HFA Inhale 2 puffs into the lungs 2 (two) times daily.   montelukast 10 MG tablet Commonly known as:  SINGULAIR Take 1 tablet (10 mg total) by mouth at bedtime.   NASACORT AQ NA Place 2  sprays into the nose 2 (two) times daily.   pantoprazole 20 MG tablet Commonly known as:  PROTONIX Take 1 tablet (20 mg total) by mouth daily.   PROAIR HFA 108 (90 Base) MCG/ACT inhaler Generic drug:  albuterol Inhale 2 puffs into the lungs every 4 (four) hours as needed for wheezing or shortness of breath.   albuterol (2.5 MG/3ML) 0.083% nebulizer solution Commonly known as:  PROVENTIL Take 3 mLs (2.5 mg total) by nebulization every 4 (four) hours as needed for wheezing or shortness of breath.   ranitidine 300 MG tablet Commonly known as:  ZANTAC Take 1 tablet (300 mg total) by mouth at bedtime.   vitamin B-12 1000 MCG tablet Commonly known as:  CYANOCOBALAMIN Take 1,000 mcg by mouth daily.   VITAMIN D-3 PO Take 800 mg by mouth daily.   ZYRTEC ALLERGY 10 MG tablet Generic drug:  cetirizine Take 10 mg by mouth daily.       Past Medical History:  Diagnosis Date  . Asthma     No past surgical history on file.  Review of systems negative except as noted in HPI / PMHx or noted below:  Review of Systems  Constitutional: Negative.   HENT: Negative.   Eyes: Negative.   Respiratory: Negative.   Cardiovascular: Negative.   Gastrointestinal: Negative.   Genitourinary: Negative.   Musculoskeletal: Negative.  Skin: Negative.   Neurological: Negative.   Endo/Heme/Allergies: Negative.   Psychiatric/Behavioral: Negative.      Objective:   Vitals:   04/07/17 1031  BP: 122/80  Pulse: 72  Resp: 16          Physical Exam  Constitutional: She is well-developed, well-nourished, and in no distress.  HENT:  Head: Normocephalic.  Right Ear: External ear normal.  Left Ear: External ear normal.  Nose: Nose normal. No mucosal edema or rhinorrhea.  Mouth/Throat: Uvula is midline, oropharynx is clear and moist and mucous membranes are normal. No oropharyngeal exudate.  Eyes: Conjunctivae are normal.  Neck: Trachea normal. No tracheal tenderness present. No tracheal  deviation present. No thyromegaly present.  Cardiovascular: Normal rate, regular rhythm, S1 normal, S2 normal and normal heart sounds.   No murmur heard. Pulmonary/Chest: Breath sounds normal. No stridor. No respiratory distress. She has no wheezes. She has no rales.  Musculoskeletal: She exhibits no edema.  Lymphadenopathy:       Head (right side): No tonsillar adenopathy present.       Head (left side): No tonsillar adenopathy present.    She has no cervical adenopathy.  Neurological: She is alert. Gait normal.  Skin: No rash noted. She is not diaphoretic. No erythema. Nails show no clubbing.  Psychiatric: Mood and affect normal.    Diagnostics:    Spirometry was performed and demonstrated an FEV1 of 1.48 at 79 % of predicted.  The patient had an Asthma Control Test with the following results: ACT Total Score: 24.    Assessment and Plan:   1. Asthma, severe persistent, well-controlled   2. Other allergic rhinitis   3. Gastroesophageal reflux disease, esophagitis presence not specified     1. Continue Symbicort 160 - two inhalations two times per day with spacer  2. During "flareup" add Flovent 110 or Qvar 80 - two inhalation 2 times per day to Symbicort   3. Continue Nasacort one spray each nostril 3-7 times per week but hold off on using right nostril for 10 days  4. Continue montelukast 10 mg daily  5. Continue Xolair and EpiPen  6. Continue omeprazole 20 mg a day and can add Ranitidine 300mg  one time per day  7. Continue Proventil HFA if needed  8. Return to clinic in 12 weeks or earlier if problem  9. Obtain fall flu vaccine  10. Revisit with Dr. Ernesto Rutherford about 'knot' under right jaw  Presently it looks like Ralph Leyden is doing quite well on her current plan and we will not really change any of this plan at this point in time and she will continue to use anti-inflammatory agents for her respiratory tract and therapy directed against reflux as noted above. I have asked  her to follow-up with Dr. Ernesto Rutherford concerning the issue with a "knot" under her right jaw as I could not really find any significant abnormality on physical exam today. I will see her back in this clinic in 12 weeks or earlier if there is a problem.  Allena Katz, MD Allergy / Immunology [place]

## 2017-04-07 NOTE — Patient Instructions (Addendum)
  1. Continue Symbicort 160 - two inhalations two times per day with spacer  2. During "flareup" add Flovent 110 or Qvar 80 - two inhalation 2 times per day to Symbicort   3. Continue Nasacort one spray each nostril 3-7 times per week but hold off on using right nostril for 10 days  4. Continue montelukast 10 mg daily  5. Continue Xolair and EpiPen  6. Continue omeprazole 20 mg a day and can add Ranitidine 300mg  one time per day  7. Continue Proventil HFA if needed  8. Return to clinic in 12 weeks or earlier if problem  9. Obtain fall flu vaccine  10. Revisit with Dr. Ernesto Rutherford about 'knot' under right jaw

## 2017-04-08 ENCOUNTER — Encounter: Payer: Self-pay | Admitting: Allergy and Immunology

## 2017-04-25 ENCOUNTER — Other Ambulatory Visit: Payer: Self-pay | Admitting: Allergy and Immunology

## 2017-05-05 ENCOUNTER — Ambulatory Visit: Payer: Self-pay

## 2017-05-06 ENCOUNTER — Ambulatory Visit (INDEPENDENT_AMBULATORY_CARE_PROVIDER_SITE_OTHER): Payer: Federal, State, Local not specified - PPO

## 2017-05-06 DIAGNOSIS — J4540 Moderate persistent asthma, uncomplicated: Secondary | ICD-10-CM | POA: Diagnosis not present

## 2017-05-24 IMAGING — DX DG CHEST 2V
2 series · 2 of 2 positions shown · non-contrast
Comparison: 08/05/2010

CLINICAL DATA: Upper chest pain and shortness of breath. Nausea.
Symptom onset this evening.

EXAM:
CHEST  2 VIEW

[chest pa]
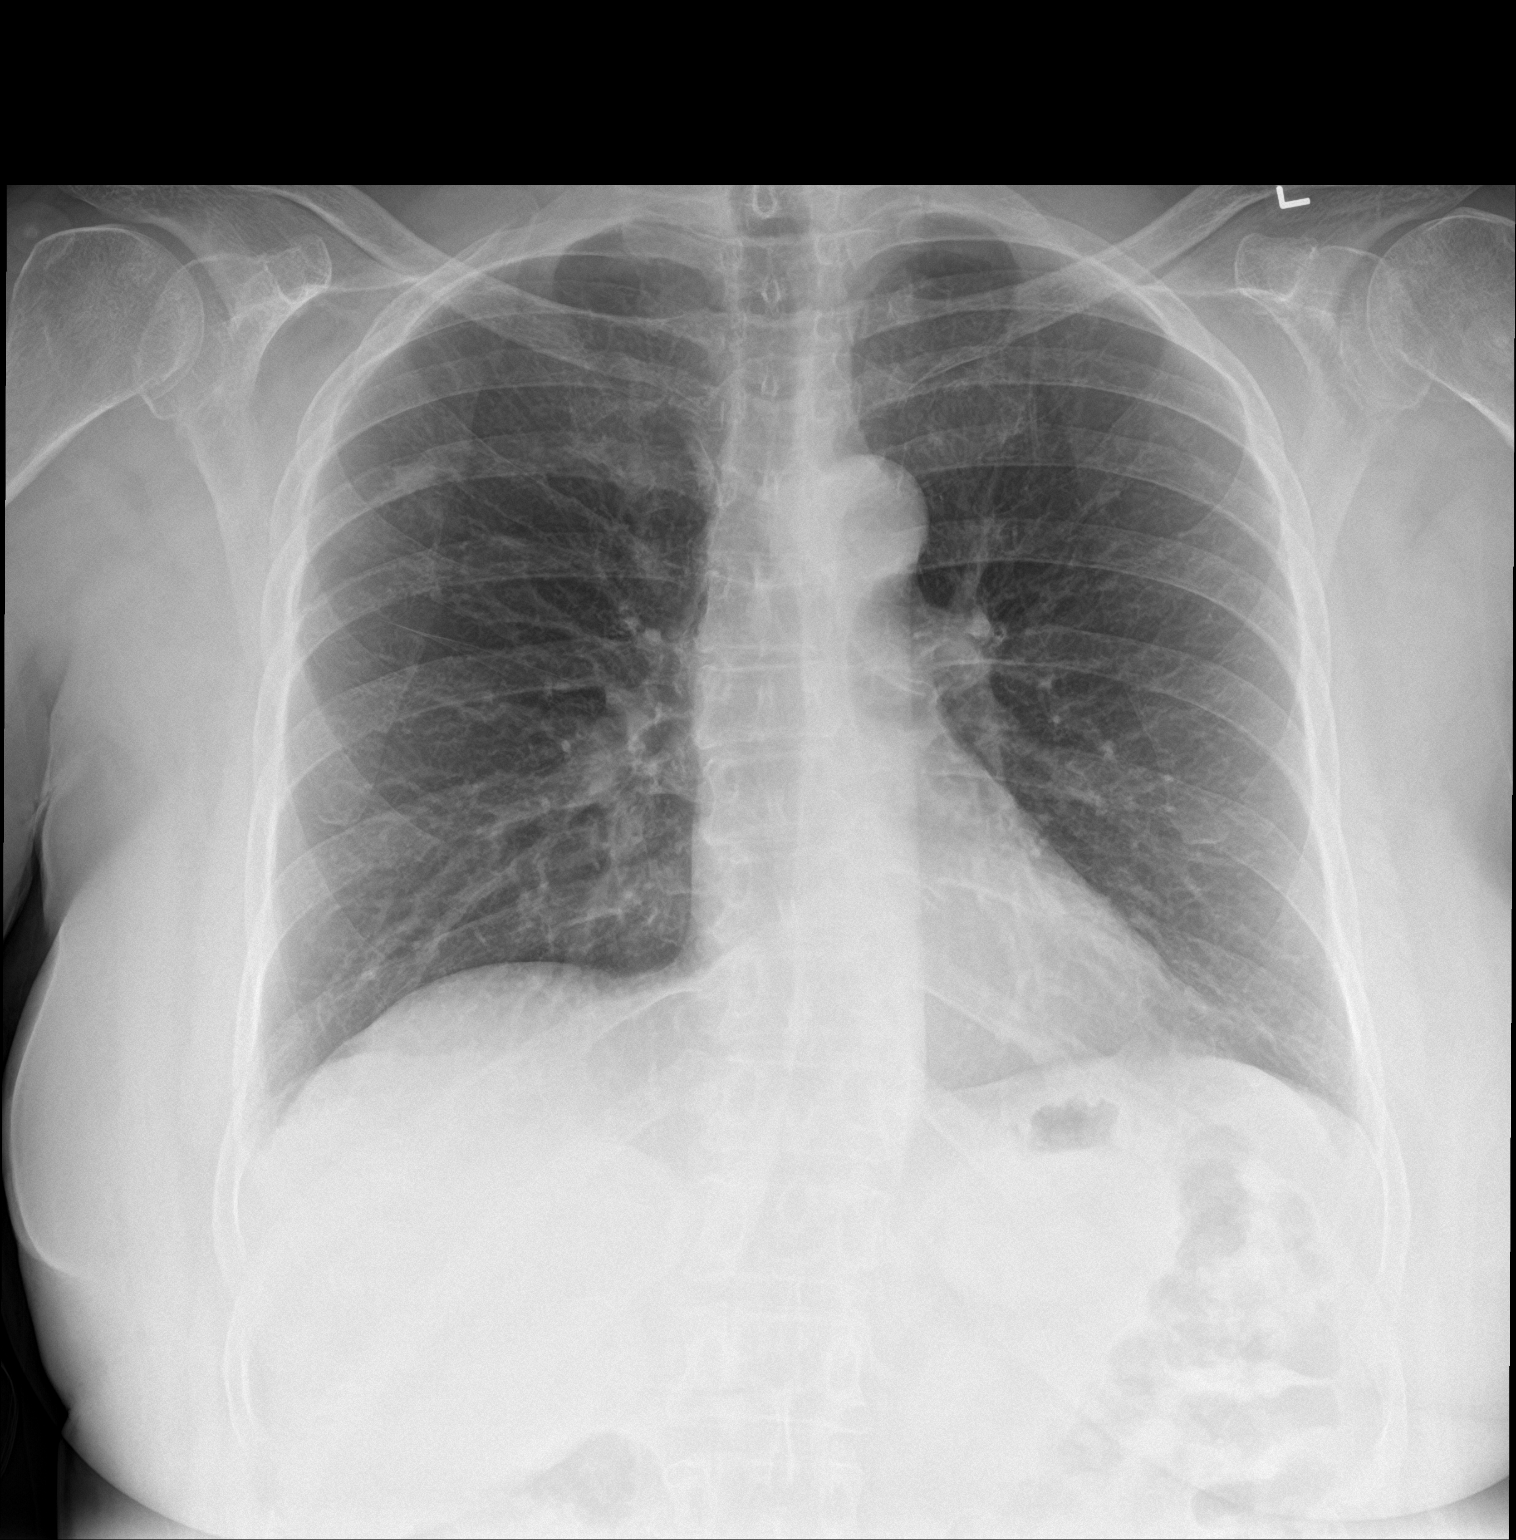

[chest lat]
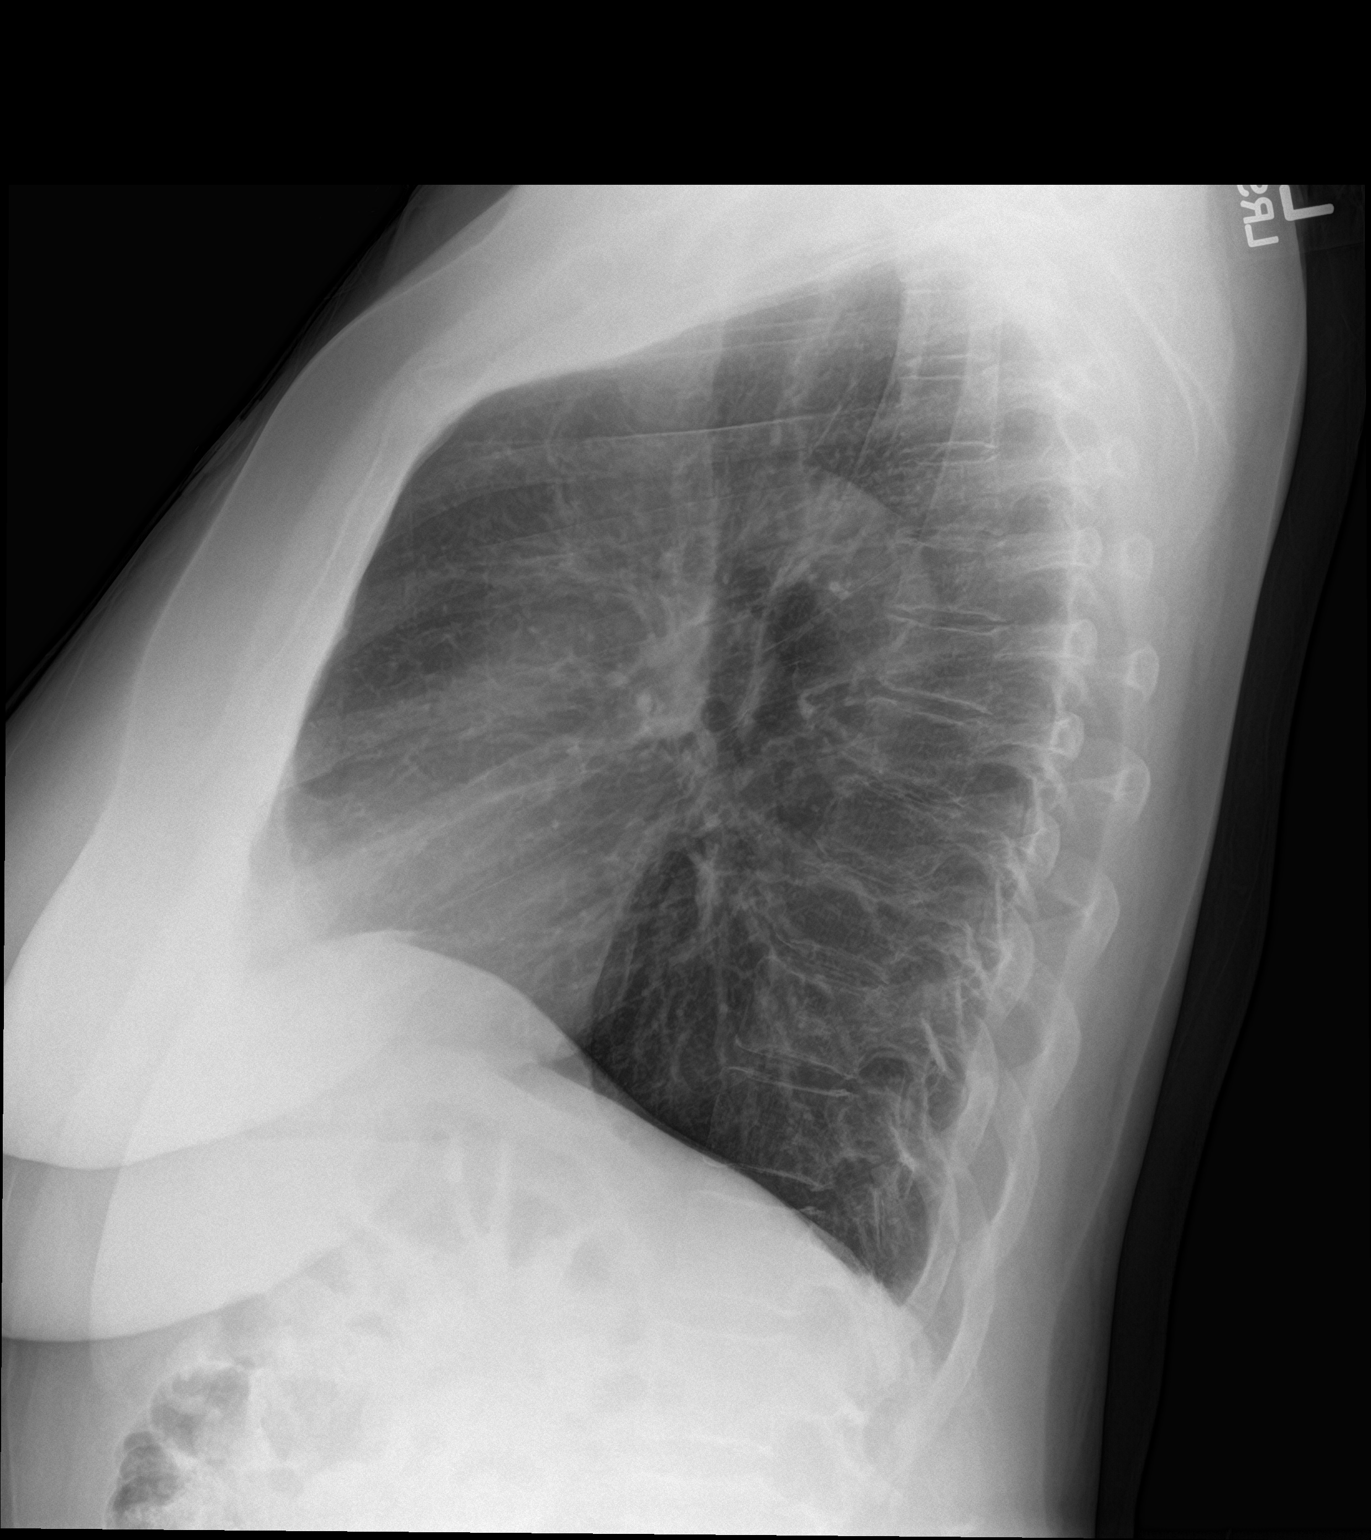

[2 of 2 positions shown; findings below may reference images not displayed]

FINDINGS: The cardiomediastinal contours are normal. Borderline hyperinflation
and central bronchial thickening. Pulmonary vasculature is normal.
No consolidation, pleural effusion, or pneumothorax. No acute
osseous abnormalities are seen. Focal sclerosis involving the right
fifth rib is likely a bone island and unchanged.
IMPRESSION: Borderline hyperinflation and central bronchial thickening, may be
bronchitis or asthma.

## 2017-06-03 ENCOUNTER — Encounter: Payer: Self-pay | Admitting: *Deleted

## 2017-06-03 NOTE — Progress Notes (Signed)
This encounter was created in error - please disregard.

## 2017-06-10 ENCOUNTER — Ambulatory Visit (INDEPENDENT_AMBULATORY_CARE_PROVIDER_SITE_OTHER): Payer: Federal, State, Local not specified - PPO | Admitting: *Deleted

## 2017-06-10 DIAGNOSIS — J4540 Moderate persistent asthma, uncomplicated: Secondary | ICD-10-CM

## 2017-06-28 ENCOUNTER — Other Ambulatory Visit: Payer: Self-pay | Admitting: Allergy & Immunology

## 2017-07-08 ENCOUNTER — Ambulatory Visit (INDEPENDENT_AMBULATORY_CARE_PROVIDER_SITE_OTHER): Payer: Federal, State, Local not specified - PPO | Admitting: Allergy and Immunology

## 2017-07-08 ENCOUNTER — Ambulatory Visit: Payer: Self-pay

## 2017-07-08 ENCOUNTER — Encounter: Payer: Self-pay | Admitting: Allergy and Immunology

## 2017-07-08 VITALS — BP 126/70 | HR 84 | Resp 16

## 2017-07-08 DIAGNOSIS — J4550 Severe persistent asthma, uncomplicated: Secondary | ICD-10-CM

## 2017-07-08 DIAGNOSIS — K219 Gastro-esophageal reflux disease without esophagitis: Secondary | ICD-10-CM

## 2017-07-08 DIAGNOSIS — J3089 Other allergic rhinitis: Secondary | ICD-10-CM

## 2017-07-08 NOTE — Patient Instructions (Addendum)
  1. Continue Symbicort 160 - two inhalations two times per day with spacer  2. During "flareup" add Flovent 110 - two inhalation 2 times per day to Symbicort   3. Continue Nasacort one spray each nostril 3-7 times per week but hold off on using right nostril for 10 days  4. Continue montelukast 10 mg daily  5. Continue Xolair and EpiPen  6. Continue omeprazole 20 mg a day and can add Ranitidine 300mg  one time per day  7. Continue Proventil HFA if needed  8. Return to clinic in 12 weeks or earlier if problem

## 2017-07-08 NOTE — Progress Notes (Signed)
Follow-up Note  Referring Provider: Allyne Gee, MD Primary Provider: Allyne Gee, MD Date of Office Visit: 07/08/2017  Subjective:   Anne Kerr (DOB: Jan 28, 1951) is a 66 y.o. female who returns to the [facility] [place] on 07/08/2017 in re-evaluation of the following:  HPI: Anne Kerr presents to this clinic in evaluation of her asthma and allergic rhinitis and reflux. Her last visit to this clinic was July 2018.  During the interval she has done very well with her asthma and has not required a systemic steroid to treat an exacerbation and rarely uses a short acting bronchodilator and can exert herself without any problem. She has been consistently using her Symbicort and does not need to add in Flovent at this point. In addition she is using omalizumab injections.  Her upper airways are doing quite well and she has not required an antibiotic to treat an episode of sinusitis or any type of ear issue. She did apparently have a root canal and was administered amoxicillin for that issue. She consistently uses Nasacort and montelukast.  She has not been having any problems with reflux or her throat while she continues on medical therapy for LPR. Presently she is using omeprazole and ranitidine in combination.  Since I have seen her in this clinic she is now on metoprolol for "leaky heart" and elevated heart rate up to 150.  Allergies as of 07/08/2017      Reactions   Aspirin Other (See Comments)   Wheeze   Ibuprofen Shortness Of Breath, Other (See Comments), Cough   Wheeze   Shellfish Allergy Other (See Comments)   Asthma Symptoms   Other Swelling   Bee Sting      Medication List      amLODipine 5 MG tablet Commonly known as:  NORVASC Take 5 mg by mouth daily.   amoxicillin 500 MG capsule Commonly known as:  AMOXIL TAKE 1 CAPSULE BY MOUTH 3 TIMES A DAY UNTIL COMPLETE   BD DISP NEEDLES 25G X 5/8" Misc Generic drug:  NEEDLE (DISP) 25 G USE AS DIRECTED TO  ADMINISTER XOLAIR   budesonide-formoterol 160-4.5 MCG/ACT inhaler Commonly known as:  SYMBICORT Inhale 2 puffs into the lungs 2 (two) times daily.   EPINEPHrine 0.3 mg/0.3 mL Soaj injection Commonly known as:  EPIPEN 2-PAK Inject 0.3 mLs (0.3 mg total) into the muscle as needed.   fluticasone 110 MCG/ACT inhaler Commonly known as:  FLOVENT HFA Inhale 2 puffs into the lungs 2 (two) times daily.   hydrochlorothiazide 25 MG tablet Commonly known as:  HYDRODIURIL Take 25 mg by mouth daily.   metoprolol succinate 50 MG 24 hr tablet Commonly known as:  TOPROL-XL Take 50 mg by mouth daily.   montelukast 10 MG tablet Commonly known as:  SINGULAIR TAKE 1 TABLET BY MOUTH EVERYDAY AT BEDTIME   NASACORT AQ NA Place 2 sprays into the nose 2 (two) times daily.   PROAIR HFA 108 (90 Base) MCG/ACT inhaler Generic drug:  albuterol Inhale 2 puffs into the lungs every 4 (four) hours as needed for wheezing or shortness of breath.   albuterol (2.5 MG/3ML) 0.083% nebulizer solution Commonly known as:  PROVENTIL Take 3 mLs (2.5 mg total) by nebulization every 4 (four) hours as needed for wheezing or shortness of breath.   ranitidine 300 MG tablet Commonly known as:  ZANTAC TAKE 1 TABLET (300 MG TOTAL) BY MOUTH AT BEDTIME.   vitamin B-12 1000 MCG tablet Commonly known as:  CYANOCOBALAMIN Take 1,000 mcg by mouth daily.   VITAMIN D-3 PO Take 800 mg by mouth daily.   ZYRTEC ALLERGY 10 MG tablet Generic drug:  cetirizine Take 10 mg by mouth daily.       Past Medical History:  Diagnosis Date  . Asthma   . High blood pressure   . Leaky heart valve   . Uterine cancer [facility])     Past Surgical History:  Procedure Laterality Date  . KNEE ARTHROSCOPY Right   . PARTIAL HYSTERECTOMY    . SINOSCOPY      Review of systems negative except as noted in HPI / PMHx or noted below:  Review of Systems  Constitutional: Negative.   HENT: Negative.   Eyes: Negative.   Respiratory: Negative.    Cardiovascular: Negative.   Gastrointestinal: Negative.   Genitourinary: Negative.   Musculoskeletal: Negative.   Skin: Negative.   Neurological: Negative.   Endo/Heme/Allergies: Negative.   Psychiatric/Behavioral: Negative.      Objective:   Vitals:   07/08/17 1012  BP: 126/70  Pulse: 84  Resp: 16  SpO2: 97%          Physical Exam  Constitutional: She is well-developed, well-nourished, and in no distress.  HENT:  Head: Normocephalic.  Right Ear: Tympanic membrane, external ear and ear canal normal.  Left Ear: External ear and ear canal normal. Tympanic membrane is scarred (postsurgical changes).  Nose: Nose normal. No mucosal edema or rhinorrhea.  Mouth/Throat: Uvula is midline, oropharynx is clear and moist and mucous membranes are normal. No oropharyngeal exudate.  Eyes: Conjunctivae are normal.  Neck: Trachea normal. No tracheal tenderness present. No tracheal deviation present. No thyromegaly present.  Cardiovascular: Normal rate, regular rhythm, S1 normal, S2 normal and normal heart sounds.   No murmur heard. Pulmonary/Chest: Breath sounds normal. No stridor. No respiratory distress. She has no wheezes. She has no rales.  Musculoskeletal: She exhibits no edema.  Lymphadenopathy:       Head (right side): No tonsillar adenopathy present.       Head (left side): No tonsillar adenopathy present.    She has no cervical adenopathy.  Neurological: She is alert. Gait normal.  Skin: No rash noted. She is not diaphoretic. No erythema. Nails show no clubbing.  Psychiatric: Mood and affect normal.    Diagnostics:    Spirometry was performed and demonstrated an FEV1 of 1.47 at 95 % of predicted.  The patient had an Asthma Control Test with the following results: ACT Total Score: 24.    Assessment and Plan:   1. Asthma, severe persistent, well-controlled   2. Other allergic rhinitis   3. Gastroesophageal reflux disease, esophagitis presence not specified     1.  Continue Symbicort 160 - two inhalations two times per day with spacer  2. During "flareup" add Flovent 110 - two inhalation 2 times per day to Symbicort   3. Continue Nasacort one spray each nostril 3-7 times per week but hold off on using right nostril for 10 days  4. Continue montelukast 10 mg daily  5. Continue Xolair and EpiPen  6. Continue omeprazole 20 mg a day and can add Ranitidine 300mg  one time per day  7. Continue Proventil HFA if needed  8. Return to clinic in 12 weeks or earlier if problem  Lilly appears to be doing quite well on her current therapy and I will have her continue to use anti-inflammatory agents for her respiratory tract including the use of omalizumab and treat  her reflux with a combination of her proton pump inhibitor and H2 receptor blocker. I will see her back in this clinic in 12 weeks or earlier if there is a problem.  Allena Katz, MD Allergy / Immunology [place]

## 2017-08-05 ENCOUNTER — Ambulatory Visit (INDEPENDENT_AMBULATORY_CARE_PROVIDER_SITE_OTHER): Payer: Federal, State, Local not specified - PPO

## 2017-08-05 DIAGNOSIS — J4540 Moderate persistent asthma, uncomplicated: Secondary | ICD-10-CM | POA: Diagnosis not present

## 2017-09-02 ENCOUNTER — Ambulatory Visit (INDEPENDENT_AMBULATORY_CARE_PROVIDER_SITE_OTHER): Payer: Federal, State, Local not specified - PPO

## 2017-09-02 ENCOUNTER — Ambulatory Visit: Payer: Self-pay

## 2017-09-02 DIAGNOSIS — J4540 Moderate persistent asthma, uncomplicated: Secondary | ICD-10-CM | POA: Diagnosis not present

## 2017-09-30 ENCOUNTER — Ambulatory Visit (INDEPENDENT_AMBULATORY_CARE_PROVIDER_SITE_OTHER): Payer: Federal, State, Local not specified - PPO | Admitting: *Deleted

## 2017-09-30 ENCOUNTER — Ambulatory Visit: Payer: Self-pay

## 2017-09-30 DIAGNOSIS — J4550 Severe persistent asthma, uncomplicated: Secondary | ICD-10-CM

## 2017-10-23 ENCOUNTER — Other Ambulatory Visit: Payer: Self-pay | Admitting: Allergy and Immunology

## 2017-10-28 ENCOUNTER — Encounter: Payer: Self-pay | Admitting: Allergy and Immunology

## 2017-10-28 ENCOUNTER — Ambulatory Visit (INDEPENDENT_AMBULATORY_CARE_PROVIDER_SITE_OTHER): Payer: Federal, State, Local not specified - PPO | Admitting: *Deleted

## 2017-10-28 ENCOUNTER — Ambulatory Visit (INDEPENDENT_AMBULATORY_CARE_PROVIDER_SITE_OTHER): Payer: Federal, State, Local not specified - PPO | Admitting: Allergy and Immunology

## 2017-10-28 VITALS — BP 116/76 | HR 73 | Resp 17 | Wt 205.0 lb

## 2017-10-28 DIAGNOSIS — J0140 Acute pansinusitis, unspecified: Secondary | ICD-10-CM | POA: Diagnosis not present

## 2017-10-28 DIAGNOSIS — K219 Gastro-esophageal reflux disease without esophagitis: Secondary | ICD-10-CM | POA: Diagnosis not present

## 2017-10-28 DIAGNOSIS — J4540 Moderate persistent asthma, uncomplicated: Secondary | ICD-10-CM

## 2017-10-28 DIAGNOSIS — J4550 Severe persistent asthma, uncomplicated: Secondary | ICD-10-CM | POA: Diagnosis not present

## 2017-10-28 DIAGNOSIS — J3089 Other allergic rhinitis: Secondary | ICD-10-CM

## 2017-10-28 MED ORDER — AMOXICILLIN-POT CLAVULANATE 875-125 MG PO TABS: 1.0000 | ORAL_TABLET | Freq: Two times a day (BID) | ORAL | 0 refills | Status: AC

## 2017-10-28 NOTE — Progress Notes (Signed)
Follow-up Note  Referring Provider: Allyne Gee, MD Primary Provider: Allyne Gee, MD Date of Office Visit: 10/28/2017  Subjective:   Anne Kerr (DOB: [birth year]) is a 67 y.o. female who returns to the [facility] and [place] on 10/28/2017 in re-evaluation of the following:  HPI: Anne Kerr returns to this clinic in reevaluation of her asthma and allergic rhinitis and reflux.  Her last visit to this clinic was 08 July 2017.  She did very well during the interval and has not required a systemic steroid or an antibiotic to treat any type of respiratory tract issue.  She has been consistently using anti-inflammatory medications for both her upper and lower airway and rarely uses a short acting bronchodilator.  She continues on omalizumab as well.  Unfortunately, about 2 weeks ago she contracted a head cold from her grandchildren and she has not improved.  She has persistent nasal congestion and drainage down her throat and coughing.  She has had a very diminished ability to smell.  She has not had any associated high fever at this point.  She did receive the flu vaccine this year.  She remains away from the consumption of shellfish.  She also makes a effort to remain away from hymenoptera venom exposure.  She does have an EpiPen.  Allergies as of 10/28/2017      Reactions   Aspirin Other (See Comments)   Wheeze   Ibuprofen Shortness Of Breath, Other (See Comments), Cough   Wheeze   Shellfish Allergy Other (See Comments)   Asthma Symptoms   Other Swelling   Bee Sting      Medication List      amLODipine 5 MG tablet Commonly known as:  NORVASC Take 5 mg by mouth daily.   BD DISP NEEDLES 25G X 5/8" Misc Generic drug:  NEEDLE (DISP) 25 G USE AS DIRECTED TO ADMINISTER XOLAIR   budesonide-formoterol 160-4.5 MCG/ACT inhaler Commonly known as:  SYMBICORT Inhale 2 puffs into the lungs 2 (two) times daily.   EPINEPHrine 0.3 mg/0.3 mL Soaj injection Commonly known  as:  EPIPEN 2-PAK Inject 0.3 mLs (0.3 mg total) into the muscle as needed.   fluticasone 110 MCG/ACT inhaler Commonly known as:  FLOVENT HFA Inhale 2 puffs into the lungs 2 (two) times daily.   hydrochlorothiazide 25 MG tablet Commonly known as:  HYDRODIURIL Take 25 mg by mouth daily.   metoprolol succinate 50 MG 24 hr tablet Commonly known as:  TOPROL-XL Take 50 mg by mouth daily.   montelukast 10 MG tablet Commonly known as:  SINGULAIR TAKE 1 TABLET BY MOUTH EVERYDAY AT BEDTIME   NASACORT AQ NA Place 2 sprays into the nose 2 (two) times daily.   omeprazole 20 MG capsule Commonly known as:  PRILOSEC Take 20 mg by mouth daily.   PROAIR HFA 108 (90 Base) MCG/ACT inhaler Generic drug:  albuterol Inhale 2 puffs into the lungs every 4 (four) hours as needed for wheezing or shortness of breath.   albuterol (2.5 MG/3ML) 0.083% nebulizer solution Commonly known as:  PROVENTIL Take 3 mLs (2.5 mg total) by nebulization every 4 (four) hours as needed for wheezing or shortness of breath.   vitamin B-12 1000 MCG tablet Commonly known as:  CYANOCOBALAMIN Take 1,000 mcg by mouth daily.   VITAMIN D-3 PO Take 800 mg by mouth daily.   ZYRTEC ALLERGY 10 MG tablet Generic drug:  cetirizine Take 10 mg by mouth daily.  Past Medical History:  Diagnosis Date  . Asthma   . High blood pressure   . Leaky heart valve   . Uterine cancer [facility])     Past Surgical History:  Procedure Laterality Date  . KNEE ARTHROSCOPY Right   . PARTIAL HYSTERECTOMY    . SINOSCOPY      Review of systems negative except as noted in HPI / PMHx or noted below:  Review of Systems  Constitutional: Negative.   HENT: Negative.   Eyes: Negative.   Respiratory: Negative.   Cardiovascular: Negative.   Gastrointestinal: Negative.   Genitourinary: Negative.   Musculoskeletal: Negative.   Skin: Negative.   Neurological: Negative.   Endo/Heme/Allergies: Negative.   Psychiatric/Behavioral:  Negative.      Objective:   Vitals:   10/28/17 1011  BP: 116/76  Pulse: 73  Resp: 17  SpO2: 97%      Weight: 205 lb (93 kg)   Physical Exam  Constitutional: She is well-developed, well-nourished, and in no distress.  HENT:  Head: Normocephalic.  Right Ear: Tympanic membrane, external ear and ear canal normal.  Left Ear: External ear and ear canal normal. Tympanic membrane is scarred.  Nose: Nose normal. No mucosal edema or rhinorrhea.  Mouth/Throat: Uvula is midline, oropharynx is clear and moist and mucous membranes are normal. No oropharyngeal exudate.  Eyes: Conjunctivae are normal.  Neck: Trachea normal. No tracheal tenderness present. No tracheal deviation present. No thyromegaly present.  Cardiovascular: Normal rate, regular rhythm, S1 normal, S2 normal and normal heart sounds.  No murmur heard. Pulmonary/Chest: Breath sounds normal. No stridor. No respiratory distress. She has no wheezes. She has no rales.  Musculoskeletal: She exhibits no edema.  Lymphadenopathy:       Head (right side): No tonsillar adenopathy present.       Head (left side): No tonsillar adenopathy present.    She has no cervical adenopathy.  Neurological: She is alert. Gait normal.  Skin: No rash noted. She is not diaphoretic. No erythema. Nails show no clubbing.  Psychiatric: Mood and affect normal.    Diagnostics:    Spirometry was performed and demonstrated an FEV1 of 1.90 at 68 % of predicted.  The patient had an Asthma Control Test with the following results: ACT Total Score: 23.    Assessment and Plan:   1. Asthma, severe persistent, well-controlled   2. Other allergic rhinitis   3. Gastroesophageal reflux disease, esophagitis presence not specified   4. Acute non-recurrent pansinusitis     1. Continue Symbicort 160 - two inhalations two times per day with spacer  2. During "flareup" add Flovent 110 - two inhalation 2 times per day to Symbicort   3. Continue Nasacort one spray  each nostril 3-7 times per week   4. Continue montelukast 10 mg daily  5. Continue Xolair and EpiPen  6. Continue Ranitidine 300mg  one time per day  7. Continue Proventil HFA if needed  8.  For this most recent episode utilize the following:   A. Augmentin 875 one tablet two times per day for 14 days  B. Prednisone 10mg  tablet one time per day for 7 days  C. Nasal saline  9. Return to clinic in 12 weeks or earlier if problem  Lilly apparently has contracted a infection of her upper airway which initially was probably viral in etiology but now it has been 2 weeks since inception and she is not improving at all and she will empirically be treated for a bacterial infection  at this point.  She will also continue on anti-inflammatory agents as noted above which have been working quite well to control her respiratory tract disease until this infection.  I will see her back in this clinic in 12 weeks or earlier if there is a problem.  Allena Katz, MD Allergy / Immunology [place]

## 2017-10-28 NOTE — Patient Instructions (Signed)
  1. Continue Symbicort 160 - two inhalations two times per day with spacer  2. During "flareup" add Flovent 110 - two inhalation 2 times per day to Symbicort   3. Continue Nasacort one spray each nostril 3-7 times per week   4. Continue montelukast 10 mg daily  5. Continue Xolair and EpiPen  6. Continue Ranitidine 300mg  one time per day  7. Continue Proventil HFA if needed  8.  For this most recent episode utilize the following:   A. Augmentin 875 one tablet two times per day for 14 days  B. Prednisone 10mg  tablet one time per day for 7 days  C. Nasal saline  9. Return to clinic in 12 weeks or earlier if problem

## 2017-10-29 ENCOUNTER — Encounter: Payer: Self-pay | Admitting: Allergy and Immunology

## 2017-11-25 ENCOUNTER — Ambulatory Visit (INDEPENDENT_AMBULATORY_CARE_PROVIDER_SITE_OTHER): Payer: Federal, State, Local not specified - PPO

## 2017-11-25 DIAGNOSIS — J4540 Moderate persistent asthma, uncomplicated: Secondary | ICD-10-CM

## 2017-12-23 ENCOUNTER — Ambulatory Visit (INDEPENDENT_AMBULATORY_CARE_PROVIDER_SITE_OTHER): Payer: Federal, State, Local not specified - PPO | Admitting: *Deleted

## 2017-12-23 ENCOUNTER — Other Ambulatory Visit: Payer: Self-pay | Admitting: Allergy and Immunology

## 2017-12-23 DIAGNOSIS — J4540 Moderate persistent asthma, uncomplicated: Secondary | ICD-10-CM | POA: Diagnosis not present

## 2018-01-20 ENCOUNTER — Encounter: Payer: Self-pay | Admitting: Allergy and Immunology

## 2018-01-20 ENCOUNTER — Ambulatory Visit: Payer: Self-pay

## 2018-01-20 ENCOUNTER — Ambulatory Visit (INDEPENDENT_AMBULATORY_CARE_PROVIDER_SITE_OTHER): Payer: Federal, State, Local not specified - PPO | Admitting: Allergy and Immunology

## 2018-01-20 VITALS — BP 110/70 | HR 84 | Resp 16

## 2018-01-20 DIAGNOSIS — Z91018 Allergy to other foods: Secondary | ICD-10-CM | POA: Diagnosis not present

## 2018-01-20 DIAGNOSIS — J4550 Severe persistent asthma, uncomplicated: Secondary | ICD-10-CM | POA: Diagnosis not present

## 2018-01-20 DIAGNOSIS — J3089 Other allergic rhinitis: Secondary | ICD-10-CM

## 2018-01-20 DIAGNOSIS — J4551 Severe persistent asthma with (acute) exacerbation: Secondary | ICD-10-CM | POA: Diagnosis not present

## 2018-01-20 DIAGNOSIS — K219 Gastro-esophageal reflux disease without esophagitis: Secondary | ICD-10-CM | POA: Diagnosis not present

## 2018-01-20 NOTE — Patient Instructions (Addendum)
  1. Continue Symbicort 160 - two inhalations two times per day with spacer  2. During "flareup" add Flovent 110 - two inhalation 2 times per day to Symbicort   3. Continue Nasacort one spray each nostril 3-7 times per week   4. Continue montelukast 10 mg daily  5. Continue Xolair and EpiPen  6. Continue Ranitidine 300mg  one time per day  7. Continue Proventil HFA if needed  8.  For this most recent episode utilize the following:   A. Prednisone 10mg  tablet one time per day for 7 days  C. Nasal saline  9. Return to clinic in summer 2019 or earlier if problem

## 2018-01-20 NOTE — Progress Notes (Signed)
Follow-up Note  Referring Provider: Allyne Gee, MD Primary Provider: Allyne Gee, MD Date of Office Visit: 01/20/2018  Subjective:   Anne Kerr (DOB: Feb 02, 1951) is a 67 y.o. female who returns to the All[facility] and [place] on 01/20/2018 in re-evaluation of the following:  HPI: Anne Kerr presents to this clinic in evaluation of asthma, allergic rhinitis, and reflux and food allergy directed against shellfish.  Her last visit to this clinic was 28 October 2017.  He did very well with her respiratory tract and did not require systemic steroid or antibiotic to treat any type of respiratory tract issue and rarely use the short acting bronchodilator and had no issues associated with reflux during the interval.  However, 1 week ago she developed a sore throat and then nasal congestion and coughing.  She has not had any fever or ugly nasal discharge or chest pain or sputum production but she does have a lot of postnasal drip.  Fortunately, her sore throat has resolved.  She did introduce Flovent as part of her action plan to her usual anti-inflammatory medications for respiratory tract when this event started.  She remains away from consuming shellfish.  Allergies as of 01/20/2018      Reactions   Aspirin Other (See Comments)   Wheeze   Ibuprofen Shortness Of Breath, Other (See Comments), Cough   Wheeze   Shellfish Allergy Other (See Comments)   Asthma Symptoms   Other Swelling   Bee Sting      Medication List      amLODipine 5 MG tablet Commonly known as:  NORVASC Take 5 mg by mouth daily.   BD DISP NEEDLES 25G X 5/8" Misc Generic drug:  NEEDLE (DISP) 25 G USE AS DIRECTED TO ADMINISTER XOLAIR   budesonide-formoterol 160-4.5 MCG/ACT inhaler Commonly known as:  SYMBICORT Inhale 2 puffs into the lungs 2 (two) times daily.   EPINEPHrine 0.3 mg/0.3 mL Soaj injection Commonly known as:  EPIPEN 2-PAK Inject 0.3 mLs (0.3 mg total) into the muscle as needed.     fluticasone 110 MCG/ACT inhaler Commonly known as:  FLOVENT HFA Inhale 2 puffs into the lungs 2 (two) times daily.   hydrochlorothiazide 25 MG tablet Commonly known as:  HYDRODIURIL Take 25 mg by mouth daily.   metoprolol succinate 50 MG 24 hr tablet Commonly known as:  TOPROL-XL Take 50 mg by mouth daily.   montelukast 10 MG tablet Commonly known as:  SINGULAIR TAKE 1 TABLET BY MOUTH EVERYDAY AT BEDTIME   NASACORT AQ NA Place 2 sprays into the nose 2 (two) times daily.   omeprazole 20 MG capsule Commonly known as:  PRILOSEC Take 20 mg by mouth daily.   PROAIR HFA 108 (90 Base) MCG/ACT inhaler Generic drug:  albuterol Inhale 2 puffs into the lungs every 4 (four) hours as needed for wheezing or shortness of breath.   albuterol (2.5 MG/3ML) 0.083% nebulizer solution Commonly known as:  PROVENTIL Take 3 mLs (2.5 mg total) by nebulization every 4 (four) hours as needed for wheezing or shortness of breath.   vitamin B-12 1000 MCG tablet Commonly known as:  CYANOCOBALAMIN Take 1,000 mcg by mouth daily.   VITAMIN D-3 PO Take 800 mg by mouth daily.   ZYRTEC ALLERGY 10 MG tablet Generic drug:  cetirizine Take 10 mg by mouth daily.       Past Medical History:  Diagnosis Date  . Asthma   . High blood pressure   . Leaky heart  valve   . Uterine cancer [facility])     Past Surgical History:  Procedure Laterality Date  . KNEE ARTHROSCOPY Right   . PARTIAL HYSTERECTOMY    . SINOSCOPY      Review of systems negative except as noted in HPI / PMHx or noted below:  Review of Systems  Constitutional: Negative.   HENT: Negative.   Eyes: Negative.   Respiratory: Negative.   Cardiovascular: Negative.   Gastrointestinal: Negative.   Genitourinary: Negative.   Musculoskeletal: Negative.   Skin: Negative.   Neurological: Negative.   Endo/Heme/Allergies: Negative.   Psychiatric/Behavioral: Negative.      Objective:   Vitals:   01/20/18 1031  BP: 110/70  Pulse: 84   Resp: 16          Physical Exam  HENT:  Head: Normocephalic.  Right Ear: External ear and ear canal normal. Tympanic membrane is scarred.  Left Ear: External ear and ear canal normal. Tympanic membrane is scarred.  Nose: Mucosal edema present. No rhinorrhea.  Mouth/Throat: Uvula is midline, oropharynx is clear and moist and mucous membranes are normal. No oropharyngeal exudate.  Eyes: Conjunctivae are normal.  Neck: Trachea normal. No tracheal tenderness present. No tracheal deviation present. No thyromegaly present.  Cardiovascular: Normal rate, regular rhythm, S1 normal, S2 normal and normal heart sounds.  No murmur heard. Pulmonary/Chest: Breath sounds normal. No stridor. No respiratory distress. She has no wheezes. She has no rales.  Musculoskeletal: She exhibits no edema.  Lymphadenopathy:       Head (right side): No tonsillar adenopathy present.       Head (left side): No tonsillar adenopathy present.    She has no cervical adenopathy.  Neurological: She is alert.  Skin: No rash noted. She is not diaphoretic. No erythema. Nails show no clubbing.    Diagnostics:    Spirometry was performed and demonstrated an FEV1 of 1.10 at 59 % of predicted.  The patient had an Asthma Control Test with the following results: ACT Total Score: 18.    Assessment and Plan:   1. Asthma, not well controlled, severe persistent, with acute exacerbation   2. Other allergic rhinitis   3. Gastroesophageal reflux disease, esophagitis presence not specified   4. Food allergy     1. Continue Symbicort 160 - two inhalations two times per day with spacer  2. During "flareup" add Flovent 110 - two inhalation 2 times per day to Symbicort   3. Continue Nasacort one spray each nostril 3-7 times per week   4. Continue montelukast 10 mg daily  5. Continue Xolair and EpiPen  6. Continue Ranitidine 300mg  one time per day  7. Continue Proventil HFA if needed  8.  For this most recent episode  utilize the following:   A. Prednisone 10mg  tablet one time per day for 7 days  C. Nasal saline  9. Return to clinic in summer 2019 or earlier if problem  Anne Kerr appears to have developed a viral respiratory tract infection that has given rise to significant inflammation of her airway and I will treat her with a combination of her action plan and a low-dose systemic steroid for the next week and she will keep in contact with me noting her response to this approach.  If she continues to do well she will remain on her very large collection of medical therapy directed against inflammation and also continue to treat reflux and I will see her back in this clinic in summer 2019 or earlier  if there is a problem.  Anne Katz, MD Allergy / Immunology [place]

## 2018-01-21 ENCOUNTER — Encounter: Payer: Self-pay | Admitting: Allergy and Immunology

## 2018-01-28 ENCOUNTER — Other Ambulatory Visit: Payer: Self-pay | Admitting: Allergy and Immunology

## 2018-02-17 ENCOUNTER — Ambulatory Visit (INDEPENDENT_AMBULATORY_CARE_PROVIDER_SITE_OTHER): Payer: Federal, State, Local not specified - PPO | Admitting: *Deleted

## 2018-02-17 DIAGNOSIS — J4540 Moderate persistent asthma, uncomplicated: Secondary | ICD-10-CM | POA: Diagnosis not present

## 2018-03-17 ENCOUNTER — Ambulatory Visit (INDEPENDENT_AMBULATORY_CARE_PROVIDER_SITE_OTHER): Payer: Federal, State, Local not specified - PPO | Admitting: *Deleted

## 2018-03-17 DIAGNOSIS — J4540 Moderate persistent asthma, uncomplicated: Secondary | ICD-10-CM | POA: Diagnosis not present

## 2018-04-14 ENCOUNTER — Other Ambulatory Visit: Payer: Self-pay | Admitting: *Deleted

## 2018-04-14 ENCOUNTER — Telehealth: Payer: Self-pay | Admitting: Allergy and Immunology

## 2018-04-14 ENCOUNTER — Ambulatory Visit (INDEPENDENT_AMBULATORY_CARE_PROVIDER_SITE_OTHER): Payer: Federal, State, Local not specified - PPO

## 2018-04-14 DIAGNOSIS — J4540 Moderate persistent asthma, uncomplicated: Secondary | ICD-10-CM | POA: Diagnosis not present

## 2018-04-14 MED ORDER — PREDNISONE 10 MG PO TABS: 10.0000 mg | ORAL_TABLET | Freq: Two times a day (BID) | ORAL | 0 refills | Status: AC

## 2018-04-14 MED ORDER — AMOXICILLIN-POT CLAVULANATE 875-125 MG PO TABS: 1.0000 | ORAL_TABLET | Freq: Two times a day (BID) | ORAL | 0 refills | Status: AC

## 2018-04-14 NOTE — Telephone Encounter (Signed)
I called and spoke with the patient and let them know about the treatment plan. The prescription has been sent in.

## 2018-04-14 NOTE — Telephone Encounter (Signed)
Please advise and thank you. 

## 2018-04-14 NOTE — Telephone Encounter (Signed)
Please have patient try prednisone 10 mg 2 times per day for 5 days plus Augmentin 875 1 tablet twice a day for 10 days

## 2018-04-14 NOTE — Telephone Encounter (Signed)
Patient was here for allergy shot Ears are stopped up Patient is going out of town Has been using spray and its not helping Can she get some amoxil called into her pharmacy  Patient uses [facility] in [place] on [address]

## 2018-04-26 ENCOUNTER — Other Ambulatory Visit: Payer: Self-pay | Admitting: Allergy and Immunology

## 2018-04-26 NOTE — Telephone Encounter (Signed)
Courtesy refill  

## 2018-05-12 ENCOUNTER — Ambulatory Visit (INDEPENDENT_AMBULATORY_CARE_PROVIDER_SITE_OTHER): Payer: Federal, State, Local not specified - PPO | Admitting: *Deleted

## 2018-05-12 DIAGNOSIS — J4540 Moderate persistent asthma, uncomplicated: Secondary | ICD-10-CM

## 2018-06-09 ENCOUNTER — Ambulatory Visit: Payer: Self-pay

## 2018-06-14 ENCOUNTER — Ambulatory Visit (INDEPENDENT_AMBULATORY_CARE_PROVIDER_SITE_OTHER): Payer: Federal, State, Local not specified - PPO | Admitting: *Deleted

## 2018-06-14 DIAGNOSIS — J4540 Moderate persistent asthma, uncomplicated: Secondary | ICD-10-CM

## 2018-06-23 ENCOUNTER — Other Ambulatory Visit: Payer: Self-pay | Admitting: Allergy and Immunology

## 2018-07-13 ENCOUNTER — Ambulatory Visit: Payer: Medicare Other

## 2018-07-20 ENCOUNTER — Ambulatory Visit (INDEPENDENT_AMBULATORY_CARE_PROVIDER_SITE_OTHER): Payer: Federal, State, Local not specified - PPO | Admitting: *Deleted

## 2018-07-20 DIAGNOSIS — J4540 Moderate persistent asthma, uncomplicated: Secondary | ICD-10-CM

## 2018-08-03 ENCOUNTER — Ambulatory Visit (INDEPENDENT_AMBULATORY_CARE_PROVIDER_SITE_OTHER): Payer: Medicare Other | Admitting: Orthopaedic Surgery

## 2018-08-17 ENCOUNTER — Ambulatory Visit (INDEPENDENT_AMBULATORY_CARE_PROVIDER_SITE_OTHER): Payer: Federal, State, Local not specified - PPO | Admitting: *Deleted

## 2018-08-17 DIAGNOSIS — J4540 Moderate persistent asthma, uncomplicated: Secondary | ICD-10-CM

## 2018-09-14 ENCOUNTER — Ambulatory Visit (INDEPENDENT_AMBULATORY_CARE_PROVIDER_SITE_OTHER): Payer: Federal, State, Local not specified - PPO | Admitting: *Deleted

## 2018-09-14 DIAGNOSIS — J4540 Moderate persistent asthma, uncomplicated: Secondary | ICD-10-CM

## 2018-10-12 ENCOUNTER — Ambulatory Visit (INDEPENDENT_AMBULATORY_CARE_PROVIDER_SITE_OTHER): Payer: Federal, State, Local not specified - PPO | Admitting: *Deleted

## 2018-10-12 DIAGNOSIS — J4540 Moderate persistent asthma, uncomplicated: Secondary | ICD-10-CM

## 2018-11-17 ENCOUNTER — Ambulatory Visit (INDEPENDENT_AMBULATORY_CARE_PROVIDER_SITE_OTHER): Payer: Federal, State, Local not specified - PPO | Admitting: *Deleted

## 2018-11-17 DIAGNOSIS — J4540 Moderate persistent asthma, uncomplicated: Secondary | ICD-10-CM

## 2018-12-15 ENCOUNTER — Other Ambulatory Visit: Payer: Self-pay

## 2018-12-15 ENCOUNTER — Ambulatory Visit (INDEPENDENT_AMBULATORY_CARE_PROVIDER_SITE_OTHER): Payer: Federal, State, Local not specified - PPO

## 2018-12-15 DIAGNOSIS — J4540 Moderate persistent asthma, uncomplicated: Secondary | ICD-10-CM

## 2018-12-21 ENCOUNTER — Other Ambulatory Visit: Payer: Self-pay

## 2018-12-21 ENCOUNTER — Telehealth: Payer: Self-pay | Admitting: Allergy and Immunology

## 2018-12-21 MED ORDER — MONTELUKAST SODIUM 10 MG PO TABS: ORAL_TABLET | ORAL | 0 refills | Status: DC

## 2018-12-21 NOTE — Telephone Encounter (Signed)
Called and spoke with Tammy the biologic coordinator and she stated that she will call and speak with Katy Apo about the matter.

## 2018-12-21 NOTE — Telephone Encounter (Signed)
Patient states that she brought in a letter that needs to be sent in for the patient to receive xolair Patient is wondering if the letter has been addressed and signed Can it be sent in for her medications  Please call

## 2019-01-12 ENCOUNTER — Ambulatory Visit: Payer: Self-pay

## 2019-01-19 ENCOUNTER — Ambulatory Visit (INDEPENDENT_AMBULATORY_CARE_PROVIDER_SITE_OTHER): Payer: Federal, State, Local not specified - PPO | Admitting: *Deleted

## 2019-01-19 ENCOUNTER — Other Ambulatory Visit: Payer: Self-pay

## 2019-01-19 DIAGNOSIS — J4540 Moderate persistent asthma, uncomplicated: Secondary | ICD-10-CM

## 2019-02-09 ENCOUNTER — Inpatient Hospital Stay: Admit: 2019-02-09 | Payer: Medicare Other | Admitting: Orthopedic Surgery

## 2019-02-09 SURGERY — ARTHROPLASTY, KNEE, TOTAL
Anesthesia: Choice | Laterality: Left

## 2019-02-15 ENCOUNTER — Other Ambulatory Visit: Payer: Self-pay

## 2019-02-15 ENCOUNTER — Ambulatory Visit (INDEPENDENT_AMBULATORY_CARE_PROVIDER_SITE_OTHER): Payer: Federal, State, Local not specified - PPO | Admitting: Allergy and Immunology

## 2019-02-15 ENCOUNTER — Encounter: Payer: Self-pay | Admitting: Allergy and Immunology

## 2019-02-15 DIAGNOSIS — Z8709 Personal history of other diseases of the respiratory system: Secondary | ICD-10-CM | POA: Diagnosis not present

## 2019-02-15 DIAGNOSIS — Z91018 Allergy to other foods: Secondary | ICD-10-CM

## 2019-02-15 DIAGNOSIS — J4550 Severe persistent asthma, uncomplicated: Secondary | ICD-10-CM

## 2019-02-15 DIAGNOSIS — J3089 Other allergic rhinitis: Secondary | ICD-10-CM

## 2019-02-15 DIAGNOSIS — K219 Gastro-esophageal reflux disease without esophagitis: Secondary | ICD-10-CM

## 2019-02-15 NOTE — Patient Instructions (Addendum)
  1. Continue Symbicort 160 - two inhalations two times per day with spacer  2. During "flareup" add Flovent 110 - two inhalation 2 times per day to Symbicort   3. Continue Nasacort one spray each nostril 3-7 times per week   4. Continue montelukast 10 mg daily  5. Continue Xolair and EpiPen  6. Continue omeprazole 20 mg one time per day  7. Continue Proventil HFA if needed  8.  For this most recent episode utilize the following:   A. Augmentin 875 - 1 tablet twice a day for 20 days  B. Prednisone 10mg  - 1 tablet for 10 days  C. Nasal saline    9. Return to clinic in 6 months or earlier if problem

## 2019-02-15 NOTE — Progress Notes (Signed)
[place]   Follow-up Note  Referring Provider: Allyne Gee, MD Primary Provider: Allyne Gee, MD Date of Office Visit: 02/15/2019  Subjective:   Anne Kerr (DOB: [birth year]) is a 68 y.o. female who returns to the [facility] on 02/15/2019 in re-evaluation of the following:  HPI: This is a E-med visit requested by patient who is located at home.  Ralph Leyden is followed in this clinic for asthma and allergic rhinitis and chronic sinusitis and reflux and food allergy directed against shellfish.  Her last visit to this clinic was 20 January 2018.  Head stopped up for two weeks. Ears are stopped up. Yellow PND. No fever. Using Nasacort and montelukast. Believes this is secondary to outdoor exposure.   Asthma OK. Rare use of SABA until past two weeks as she has developed wheezing even though using Symbicort and Xolair.   Reflux OK.  Allergies as of 02/15/2019      Reactions   Aspirin Other (See Comments)   Wheeze   Ibuprofen Shortness Of Breath, Other (See Comments), Cough   Wheeze   Shellfish Allergy Other (See Comments)   Asthma Symptoms   Other Swelling   Bee Sting      Medication List      amLODipine 5 MG tablet Commonly known as:  NORVASC Take 5 mg by mouth daily.   BD Disp Needles 25G X 5/8" Misc Generic drug:  NEEDLE (DISP) 25 G USE AS DIRECTED TO ADMINISTER XOLAIR   budesonide-formoterol 160-4.5 MCG/ACT inhaler Commonly known as:  Symbicort Inhale 2 puffs into the lungs 2 (two) times daily.   EPINEPHrine 0.3 mg/0.3 mL Soaj injection Commonly known as:  EpiPen 2-Pak Inject 0.3 mLs (0.3 mg total) into the muscle as needed.   Flovent HFA 110 MCG/ACT inhaler Generic drug:  fluticasone TAKE 2 PUFFS BY MOUTH TWICE A DAY   hydrochlorothiazide 25 MG tablet Commonly known as:  HYDRODIURIL Take 25 mg by mouth daily.   metoprolol succinate 50 MG 24 hr tablet Commonly known as:  TOPROL-XL Take  50 mg by mouth daily.   montelukast 10 MG tablet Commonly known as:  SINGULAIR TAKE 1 TABLET BY MOUTH EVERYDAY AT BEDTIME   NASACORT AQ NA Place 2 sprays into the nose 2 (two) times daily.   omeprazole 20 MG capsule Commonly known as:  PRILOSEC Take 20 mg by mouth daily.   ProAir HFA 108 (90 Base) MCG/ACT inhaler Generic drug:  albuterol Inhale 2 puffs into the lungs every 4 (four) hours as needed for wheezing or shortness of breath.   albuterol (2.5 MG/3ML) 0.083% nebulizer solution Commonly known as:  PROVENTIL Take 3 mLs (2.5 mg total) by nebulization every 4 (four) hours as needed for wheezing or shortness of breath.   rosuvastatin 10 MG tablet Commonly known as:  CRESTOR Take 10 mg by mouth daily.   vitamin B-12 1000 MCG tablet Commonly known as:  CYANOCOBALAMIN Take 1,000 mcg by mouth daily.   VITAMIN D-3 PO Take 800 mg by mouth daily.   ZyrTEC Allergy 10 MG tablet Generic drug:  cetirizine Take 10 mg by mouth daily.       Past Medical History:  Diagnosis Date  . Asthma   . High blood pressure   . Hyperlipidemia   . Leaky heart valve   . Uterine cancer [facility])     Past Surgical History:  Procedure Laterality Date  . KNEE ARTHROSCOPY Right   .  PARTIAL HYSTERECTOMY    . SINOSCOPY      Review of systems negative except as noted in HPI / PMHx or noted below:  Review of Systems  Constitutional: Negative.   HENT: Negative.   Eyes: Negative.   Respiratory: Negative.   Cardiovascular: Negative.   Gastrointestinal: Negative.   Genitourinary: Negative.   Musculoskeletal: Negative.   Skin: Negative.   Neurological: Negative.   Endo/Heme/Allergies: Negative.   Psychiatric/Behavioral: Negative.      Objective:   There were no vitals filed for this visit.        Physical Exam-deferred  Diagnostics: none  Assessment and Plan:   1. Asthma, severe persistent, well-controlled   2. Other allergic rhinitis   3. History of chronic sinusitis   4.  Food allergy   5. Gastroesophageal reflux disease, esophagitis presence not specified     1. Continue Symbicort 160 - two inhalations two times per day with spacer  2. During "flareup" add Flovent 110 - two inhalation 2 times per day to Symbicort   3. Continue Nasacort one spray each nostril 3-7 times per week   4. Continue montelukast 10 mg daily  5. Continue Xolair and EpiPen  6. Continue omeprazole 20 mg one time per day  7. Continue Proventil HFA if needed  8.  For this most recent episode utilize the following:   A. Augmentin 875 - 1 tablet twice a day for 20 days  B. Prednisone 10mg  - 1 tablet for 10 days  C. Nasal saline    9. Return to clinic in 6 months or earlier if problem  It sounds as though Ralph Leyden has developed a respiratory tract infection this spring and I will give her a very broad-spectrum antibiotic for the next 20 days and some low-dose systemic steroids for the next 10 days.  In the past she has required a fair amount of aggressive therapy to get these infections under control.  But other than this event she actually sounds as though she was doing relatively well for the past year on her current plan of anti-inflammatory medications for her airway including the use of Xolair.  Assuming she continues to do well I will see her back in this clinic in 6 months or earlier if there is a problem.  Total patient interaction time 20 minutes.  Allena Katz, MD Allergy / Immunology [place]

## 2019-02-16 ENCOUNTER — Ambulatory Visit: Payer: Federal, State, Local not specified - PPO

## 2019-02-16 ENCOUNTER — Encounter: Payer: Self-pay | Admitting: Allergy and Immunology

## 2019-02-16 ENCOUNTER — Other Ambulatory Visit: Payer: Self-pay | Admitting: *Deleted

## 2019-02-16 MED ORDER — AMOXICILLIN-POT CLAVULANATE 875-125 MG PO TABS: 1.0000 | ORAL_TABLET | Freq: Two times a day (BID) | ORAL | 0 refills | Status: AC

## 2019-02-16 MED ORDER — PREDNISONE 10 MG PO TABS: 10.0000 mg | ORAL_TABLET | Freq: Every day | ORAL | 0 refills | Status: AC

## 2019-02-22 NOTE — Progress Notes (Signed)
Patient is in home  Provider is in office Start Time: 424 End Time: 449

## 2019-02-25 ENCOUNTER — Other Ambulatory Visit: Payer: Self-pay

## 2019-02-25 ENCOUNTER — Ambulatory Visit (INDEPENDENT_AMBULATORY_CARE_PROVIDER_SITE_OTHER): Payer: Federal, State, Local not specified - PPO | Admitting: *Deleted

## 2019-02-25 DIAGNOSIS — J4540 Moderate persistent asthma, uncomplicated: Secondary | ICD-10-CM | POA: Diagnosis not present

## 2019-03-15 ENCOUNTER — Other Ambulatory Visit: Payer: Self-pay | Admitting: Allergy and Immunology

## 2019-03-25 ENCOUNTER — Ambulatory Visit (INDEPENDENT_AMBULATORY_CARE_PROVIDER_SITE_OTHER): Payer: Federal, State, Local not specified - PPO | Admitting: *Deleted

## 2019-03-25 ENCOUNTER — Other Ambulatory Visit: Payer: Self-pay

## 2019-03-25 DIAGNOSIS — J4540 Moderate persistent asthma, uncomplicated: Secondary | ICD-10-CM | POA: Diagnosis not present

## 2019-04-18 ENCOUNTER — Ambulatory Visit: Payer: Self-pay | Admitting: Orthopedic Surgery

## 2019-04-22 ENCOUNTER — Ambulatory Visit (INDEPENDENT_AMBULATORY_CARE_PROVIDER_SITE_OTHER): Payer: Federal, State, Local not specified - PPO | Admitting: *Deleted

## 2019-04-22 ENCOUNTER — Other Ambulatory Visit: Payer: Self-pay

## 2019-04-22 DIAGNOSIS — J4540 Moderate persistent asthma, uncomplicated: Secondary | ICD-10-CM

## 2019-04-27 DIAGNOSIS — M25562 Pain in left knee: Secondary | ICD-10-CM | POA: Insufficient documentation

## 2019-04-27 DIAGNOSIS — M25662 Stiffness of left knee, not elsewhere classified: Secondary | ICD-10-CM | POA: Insufficient documentation

## 2019-04-27 HISTORY — DX: Pain in left knee: M25.562

## 2019-05-04 ENCOUNTER — Encounter
Admission: RE | Admit: 2019-05-04 | Discharge: 2019-05-04 | Disposition: A | Discharge status: Home / Self Care | Payer: Medicare Other | Source: Ambulatory Visit | Attending: Orthopedic Surgery | Admitting: Orthopedic Surgery

## 2019-05-04 ENCOUNTER — Other Ambulatory Visit: Payer: Self-pay

## 2019-05-04 DIAGNOSIS — Z20828 Contact with and (suspected) exposure to other viral communicable diseases: Secondary | ICD-10-CM | POA: Diagnosis not present

## 2019-05-04 DIAGNOSIS — Z01812 Encounter for preprocedural laboratory examination: Secondary | ICD-10-CM | POA: Diagnosis not present

## 2019-05-04 HISTORY — DX: Unspecified osteoarthritis, unspecified site: M19.90

## 2019-05-04 HISTORY — DX: Gastro-esophageal reflux disease without esophagitis: K21.9

## 2019-05-04 HISTORY — DX: Tachycardia, unspecified: R00.0

## 2019-05-04 HISTORY — DX: Personal history of urinary calculi: Z87.442

## 2019-05-04 HISTORY — DX: Transient cerebral ischemic attack, unspecified: G45.9

## 2019-05-04 HISTORY — DX: Type 2 diabetes mellitus without complications: E11.9

## 2019-05-04 HISTORY — DX: Other complications of anesthesia, initial encounter: T88.59XA

## 2019-05-04 LAB — BASIC METABOLIC PANEL
Anion gap: 10 (ref 5–15)
BUN: 7 mg/dL — ABNORMAL LOW (ref 8–23)
CO2: 26 mmol/L (ref 22–32)
Calcium: 9.1 mg/dL (ref 8.9–10.3)
Chloride: 103 mmol/L (ref 98–111)
Creatinine, Ser: 0.88 mg/dL (ref 0.44–1.00)
GFR calc Af Amer: >60 mL/min (ref >60)
GFR calc non Af Amer: >60 mL/min (ref >60)
Glucose, Bld: 112 mg/dL — ABNORMAL HIGH (ref 70–99)
Potassium: 3.5 mmol/L (ref 3.5–5.1)
Sodium: 139 mmol/L (ref 135–145)

## 2019-05-04 LAB — URINALYSIS, ROUTINE W REFLEX MICROSCOPIC
Bilirubin Urine: NEGATIVE
Glucose, UA: NEGATIVE mg/dL
Hgb urine dipstick: NEGATIVE
Ketones, ur: NEGATIVE mg/dL
Leukocytes,Ua: NEGATIVE
Nitrite: NEGATIVE
Protein, ur: NEGATIVE mg/dL
Specific Gravity, Urine: 1.013 (ref 1.005–1.030)
pH: 5 (ref 5.0–8.0)

## 2019-05-04 LAB — CBC
HCT: 40.3 % (ref 36.0–46.0)
Hemoglobin: 12.8 g/dL (ref 12.0–15.0)
MCH: 26.4 pg (ref 26.0–34.0)
MCHC: 31.8 g/dL (ref 30.0–36.0)
MCV: 83.3 fL (ref 80.0–100.0)
Platelets: 220 10*3/uL (ref 150–400)
RBC: 4.84 MIL/uL (ref 3.87–5.11)
RDW: 14.8 % (ref 11.5–15.5)
WBC: 6.2 10*3/uL (ref 4.0–10.5)
nRBC: 0 % (ref 0.0–0.2)

## 2019-05-04 LAB — TYPE AND SCREEN
ABO/RH(D): B POS
Antibody Screen: NEGATIVE

## 2019-05-04 LAB — PROTIME-INR
INR: 1 (ref 0.8–1.2)
Prothrombin Time: 12.9 seconds (ref 11.4–15.2)

## 2019-05-04 LAB — SURGICAL PCR SCREEN
MRSA, PCR: NEGATIVE
Staphylococcus aureus: POSITIVE — AB

## 2019-05-04 LAB — APTT: aPTT: 31 seconds (ref 24–36)

## 2019-05-04 NOTE — Patient Instructions (Addendum)
Your procedure is scheduled on: 05-11-19 [facility] Report to Same Day Surgery 2nd floor medical mall [facility] Entrance-take elevator on left to 2nd floor.  Check in with surgery information desk.) To find out your arrival time please call [phone] between 1PM - 3PM on 05-10-19 TUESDAY  Remember: Instructions that are not followed completely may result in serious medical risk, up to and including death, or upon the discretion of your surgeon and anesthesiologist your surgery may need to be rescheduled.    _x___ 1. Do not eat food after midnight the night before your procedure. NO GUM OR CANDY AFTER MIDNIGHT. You may drink WATER up to 2 hours before you are scheduled to arrive at the hospital for your procedure.  Do not drink WATER  within 2 hours of your scheduled arrival to the hospital.  Type 1 and type 2 diabetics should only drink water.   ____Ensure clear carbohydrate drink on the way to the hospital for bariatric patients  _X___GATORADE G2 drink 3 hours before surgery.     __x__ 2. No Alcohol for 24 hours before or after surgery.   __x__3. No Smoking or e-cigarettes for 24 prior to surgery.  Do not use any chewable tobacco products for at least 6 hour prior to surgery   ____  4. Bring all medications with you on the day of surgery if instructed.    __x__ 5. Notify your doctor if there is any change in your medical condition     (cold, fever, infections).    x___6. On the morning of surgery brush your teeth with toothpaste and water.  You may rinse your mouth with mouth wash if you wish.  Do not swallow any toothpaste or mouthwash.   Do not wear jewelry, make-up, hairpins, clips or nail polish.  Do not wear lotions, powders, or perfumes. You may wear deodorant.  Do not shave 48 hours prior to surgery. Men may shave face and neck.  Do not bring valuables to the hospital.    [facility] is not responsible for any belongings or valuables.               Contacts, dentures  or bridgework may not be worn into surgery.  Leave your suitcase in the car. After surgery it may be brought to your room.  For patients admitted to the hospital, discharge time is determined by your treatment team.  _  Patients discharged the day of surgery will not be allowed to drive home.  You will need someone to drive you home and stay with you the night of your procedure.    Please read over the following fact sheets that you were given:   [facility] Preparing for Surgery and or MRSA Information   _x___ TAKE THE FOLLOWING MEDICATION THE MORNING OF SURGERY WITH A SMALL SIP OF WATER. These include:  1. METOPROLOL (TOPROL)  2. ZYRTEC (CETIRIZINE)  3. PRILOSEC (OMEPRAZOLE)  4. TAKE AN EXTRA PRILOSEC THE NIGHT BEFORE YOUR SURGERY  5.  6.  ____Fleets enema or Magnesium Citrate as directed.   _x___ Use CHG Soap or sage wipes as directed on instruction sheet   _X___ Use inhalers on the day of surgery and bring to hospital day of surgery-USE YOUR NEBULIZER AND FLOVENT THE DAY OF SURGERY AND BRING YOUR ALBUTEROL [place]  _X___ Stop Metformin  2 days prior to surgery-LAST DOSE ON Sunday, AUGUST 9TH    ____ Take 1/2 of usual insulin dose the night before surgery  and none on the morning surgery.   _x___ Follow recommendations from Cardiologist, Pulmonologist or PCP regarding stopping Aspirin, Coumadin, Plavix ,Eliquis, Effient, or Pradaxa, and Pletal.  X____Stop Anti-inflammatories such as Advil, Aleve, Ibuprofen, Motrin, Naproxen, Naprosyn, Goodies powders or aspirin products NOW-OK to take Tylenol   ____ Stop supplements until after surgery.     ____ Bring C-Pap to the hospital.

## 2019-05-05 DIAGNOSIS — Z22321 Carrier or suspected carrier of Methicillin susceptible Staphylococcus aureus: Secondary | ICD-10-CM | POA: Insufficient documentation

## 2019-05-05 DIAGNOSIS — M179 Osteoarthritis of knee, unspecified: Secondary | ICD-10-CM | POA: Insufficient documentation

## 2019-05-06 ENCOUNTER — Other Ambulatory Visit: Payer: Self-pay

## 2019-05-06 ENCOUNTER — Other Ambulatory Visit
Admission: RE | Admit: 2019-05-06 | Discharge: 2019-05-06 | Disposition: A | Discharge status: Home / Self Care | Payer: Medicare Other | Source: Ambulatory Visit | Attending: Orthopedic Surgery | Admitting: Orthopedic Surgery

## 2019-05-06 DIAGNOSIS — Z01812 Encounter for preprocedural laboratory examination: Secondary | ICD-10-CM | POA: Diagnosis not present

## 2019-05-07 LAB — SARS CORONAVIRUS 2 (TAT 6-24 HRS): SARS Coronavirus 2: NEGATIVE

## 2019-05-10 MED ORDER — TRANEXAMIC ACID-NACL 1000-0.7 MG/100ML-% IV SOLN
1000.0000 mg | INTRAVENOUS | Status: AC
  Administered 2019-05-11: 1000 mg via INTRAVENOUS
  Filled 2019-05-10: qty 100

## 2019-05-10 MED ORDER — CEFAZOLIN SODIUM-DEXTROSE 2-4 GM/100ML-% IV SOLN
2.0000 g | INTRAVENOUS | Status: AC
  Administered 2019-05-11: 2 g via INTRAVENOUS

## 2019-05-10 NOTE — Pre-Procedure Instructions (Signed)
PT HAS IODINE ALLERGY-ASKED TIFFANY ABOUT WHAT DR Harlow Mares WANTED TO DO REGARDING PROFEN-SHE SAID DR BOWERS HAS ORDERED PT TO TAKE MUPIRICIN X 5 DAYS PRIOR TO SURGERY

## 2019-05-11 ENCOUNTER — Inpatient Hospital Stay: Payer: Medicare Other | Admitting: Anesthesiology

## 2019-05-11 ENCOUNTER — Encounter
Admission: RE | Disposition: A | Discharge status: Home Health | Payer: Self-pay | Source: Home / Self Care | Attending: Orthopedic Surgery

## 2019-05-11 ENCOUNTER — Other Ambulatory Visit: Payer: Self-pay

## 2019-05-11 ENCOUNTER — Inpatient Hospital Stay: Payer: Medicare Other

## 2019-05-11 ENCOUNTER — Inpatient Hospital Stay
Admission: RE | Admit: 2019-05-11 | Discharge: 2019-05-13 | DRG: 470 | Disposition: A | Discharge status: Home Health | Payer: Medicare Other | Attending: Orthopedic Surgery | Admitting: Orthopedic Surgery

## 2019-05-11 DIAGNOSIS — Z96652 Presence of left artificial knee joint: Secondary | ICD-10-CM

## 2019-05-11 DIAGNOSIS — Z888 Allergy status to other drugs, medicaments and biological substances status: Secondary | ICD-10-CM

## 2019-05-11 DIAGNOSIS — Z8673 Personal history of transient ischemic attack (TIA), and cerebral infarction without residual deficits: Secondary | ICD-10-CM

## 2019-05-11 DIAGNOSIS — E119 Type 2 diabetes mellitus without complications: Secondary | ICD-10-CM | POA: Diagnosis present

## 2019-05-11 DIAGNOSIS — E785 Hyperlipidemia, unspecified: Secondary | ICD-10-CM | POA: Diagnosis present

## 2019-05-11 DIAGNOSIS — Z91030 Bee allergy status: Secondary | ICD-10-CM

## 2019-05-11 DIAGNOSIS — Z87442 Personal history of urinary calculi: Secondary | ICD-10-CM | POA: Diagnosis not present

## 2019-05-11 DIAGNOSIS — Z79899 Other long term (current) drug therapy: Secondary | ICD-10-CM | POA: Diagnosis not present

## 2019-05-11 DIAGNOSIS — J45909 Unspecified asthma, uncomplicated: Secondary | ICD-10-CM | POA: Diagnosis present

## 2019-05-11 DIAGNOSIS — Z91013 Allergy to seafood: Secondary | ICD-10-CM | POA: Diagnosis not present

## 2019-05-11 DIAGNOSIS — Z7984 Long term (current) use of oral hypoglycemic drugs: Secondary | ICD-10-CM

## 2019-05-11 DIAGNOSIS — K219 Gastro-esophageal reflux disease without esophagitis: Secondary | ICD-10-CM | POA: Diagnosis present

## 2019-05-11 DIAGNOSIS — Z7901 Long term (current) use of anticoagulants: Secondary | ICD-10-CM

## 2019-05-11 DIAGNOSIS — Z79891 Long term (current) use of opiate analgesic: Secondary | ICD-10-CM | POA: Diagnosis not present

## 2019-05-11 DIAGNOSIS — Z886 Allergy status to analgesic agent status: Secondary | ICD-10-CM

## 2019-05-11 DIAGNOSIS — M1712 Unilateral primary osteoarthritis, left knee: Secondary | ICD-10-CM | POA: Diagnosis present

## 2019-05-11 DIAGNOSIS — Z7951 Long term (current) use of inhaled steroids: Secondary | ICD-10-CM

## 2019-05-11 DIAGNOSIS — Z09 Encounter for follow-up examination after completed treatment for conditions other than malignant neoplasm: Secondary | ICD-10-CM

## 2019-05-11 HISTORY — PX: TOTAL KNEE ARTHROPLASTY: SHX125

## 2019-05-11 LAB — GLUCOSE, CAPILLARY
Glucose-Capillary: 110 mg/dL — ABNORMAL HIGH (ref 70–99)
Glucose-Capillary: 92 mg/dL (ref 70–99)
Glucose-Capillary: 138 mg/dL — ABNORMAL HIGH (ref 70–99)
Glucose-Capillary: 127 mg/dL — ABNORMAL HIGH (ref 70–99)

## 2019-05-11 LAB — ABO/RH: ABO/RH(D): B POS

## 2019-05-11 SURGERY — ARTHROPLASTY, KNEE, TOTAL
Anesthesia: Regional | Site: Knee | Laterality: Left

## 2019-05-11 MED ORDER — MAGNESIUM CITRATE PO SOLN
1.0000 | Freq: Once | ORAL | Status: DC | PRN
  Filled 2019-05-11: qty 296

## 2019-05-11 MED ORDER — PROPOFOL 500 MG/50ML IV EMUL
INTRAVENOUS | Status: AC
  Filled 2019-05-11: qty 50

## 2019-05-11 MED ORDER — ACETAMINOPHEN 500 MG PO TABS
1000.0000 mg | ORAL_TABLET | Freq: Once | ORAL | Status: AC
  Administered 2019-05-11: 07:00:00 1000 mg via ORAL

## 2019-05-11 MED ORDER — BUDESONIDE 0.25 MG/2ML IN SUSP
0.2500 mg | Freq: Two times a day (BID) | RESPIRATORY_TRACT | Status: DC
  Administered 2019-05-12 (×2): 0.25 mg via RESPIRATORY_TRACT
  Filled 2019-05-11 (×4): qty 2

## 2019-05-11 MED ORDER — MAGNESIUM HYDROXIDE 400 MG/5ML PO SUSP
30.0000 mL | Freq: Every day | ORAL | Status: DC | PRN
  Administered 2019-05-13: 09:00:00 30 mL via ORAL
  Filled 2019-05-11 (×2): qty 30

## 2019-05-11 MED ORDER — BISACODYL 10 MG RE SUPP
10.0000 mg | Freq: Every day | RECTAL | Status: DC | PRN
  Administered 2019-05-13: 13:00:00 10 mg via RECTAL
  Filled 2019-05-11: qty 1

## 2019-05-11 MED ORDER — RIVAROXABAN 10 MG PO TABS
10.0000 mg | ORAL_TABLET | Freq: Every day | ORAL | Status: DC
  Administered 2019-05-12 – 2019-05-13 (×2): 10 mg via ORAL
  Filled 2019-05-11 (×2): qty 1

## 2019-05-11 MED ORDER — BUPIVACAINE-EPINEPHRINE (PF) 0.5% -1:200000 IJ SOLN
INTRAMUSCULAR | Status: AC
  Filled 2019-05-11: qty 30

## 2019-05-11 MED ORDER — GLYCOPYRROLATE 0.2 MG/ML IJ SOLN
INTRAMUSCULAR | Status: DC | PRN
  Administered 2019-05-11: 0.2 mg via INTRAVENOUS

## 2019-05-11 MED ORDER — ONDANSETRON HCL 4 MG/2ML IJ SOLN: 4.0000 mg | Freq: Once | INTRAMUSCULAR | Status: DC | PRN

## 2019-05-11 MED ORDER — BUPIVACAINE LIPOSOME 1.3 % IJ SUSP: 20.0000 mL | Freq: Once | INTRAMUSCULAR | Status: DC

## 2019-05-11 MED ORDER — HYDROCHLOROTHIAZIDE 12.5 MG PO CAPS
12.5000 mg | ORAL_CAPSULE | Freq: Every day | ORAL | Status: DC
  Administered 2019-05-11 – 2019-05-12 (×2): 12.5 mg via ORAL
  Filled 2019-05-11 (×2): qty 1

## 2019-05-11 MED ORDER — FENTANYL CITRATE (PF) 100 MCG/2ML IJ SOLN
INTRAMUSCULAR | Status: AC
  Filled 2019-05-11: qty 2

## 2019-05-11 MED ORDER — BUPIVACAINE-EPINEPHRINE (PF) 0.5% -1:200000 IJ SOLN
INTRAMUSCULAR | Status: DC | PRN
  Administered 2019-05-11: 30 mL

## 2019-05-11 MED ORDER — LIDOCAINE HCL (PF) 2 % IJ SOLN
INTRAMUSCULAR | Status: DC | PRN
  Administered 2019-05-11: 50 mg

## 2019-05-11 MED ORDER — METOCLOPRAMIDE HCL 10 MG PO TABS: 5.0000 mg | ORAL_TABLET | Freq: Three times a day (TID) | ORAL | Status: DC | PRN

## 2019-05-11 MED ORDER — FENTANYL CITRATE (PF) 100 MCG/2ML IJ SOLN
INTRAMUSCULAR | Status: DC | PRN
  Administered 2019-05-11 (×2): 50 ug via INTRAVENOUS

## 2019-05-11 MED ORDER — ACETAMINOPHEN 500 MG PO TABS
ORAL_TABLET | ORAL | Status: AC
  Administered 2019-05-11: 1000 mg via ORAL
  Filled 2019-05-11: qty 2

## 2019-05-11 MED ORDER — ROPIVACAINE HCL 5 MG/ML IJ SOLN
INTRAMUSCULAR | Status: DC | PRN
  Administered 2019-05-11: 30 mL via PERINEURAL
  Administered 2019-05-11: 30 mL via EPIDURAL

## 2019-05-11 MED ORDER — VITAMIN D3 25 MCG (1000 UNIT) PO TABS
5000.0000 [IU] | ORAL_TABLET | Freq: Every day | ORAL | Status: DC
  Administered 2019-05-11 – 2019-05-13 (×3): 5000 [IU] via ORAL
  Filled 2019-05-11 (×6): qty 5

## 2019-05-11 MED ORDER — TRAMADOL HCL 50 MG PO TABS
50.0000 mg | ORAL_TABLET | Freq: Four times a day (QID) | ORAL | Status: DC
  Administered 2019-05-11 – 2019-05-13 (×9): 50 mg via ORAL
  Filled 2019-05-11 (×9): qty 1

## 2019-05-11 MED ORDER — ACETAMINOPHEN 500 MG PO TABS
500.0000 mg | ORAL_TABLET | Freq: Four times a day (QID) | ORAL | Status: AC
  Administered 2019-05-11 – 2019-05-12 (×4): 500 mg via ORAL
  Filled 2019-05-11 (×4): qty 1

## 2019-05-11 MED ORDER — LORATADINE 10 MG PO TABS
10.0000 mg | ORAL_TABLET | Freq: Every day | ORAL | Status: DC
  Administered 2019-05-12 – 2019-05-13 (×2): 10 mg via ORAL
  Filled 2019-05-11 (×3): qty 1

## 2019-05-11 MED ORDER — GABAPENTIN 300 MG PO CAPS
300.0000 mg | ORAL_CAPSULE | Freq: Once | ORAL | Status: AC
  Administered 2019-05-11: 07:00:00 300 mg via ORAL

## 2019-05-11 MED ORDER — ONDANSETRON HCL 4 MG PO TABS: 4.0000 mg | ORAL_TABLET | Freq: Four times a day (QID) | ORAL | Status: DC | PRN

## 2019-05-11 MED ORDER — MIDAZOLAM HCL 5 MG/5ML IJ SOLN
INTRAMUSCULAR | Status: DC | PRN
  Administered 2019-05-11: 2 mg via INTRAVENOUS

## 2019-05-11 MED ORDER — SODIUM CHLORIDE 0.9 % IR SOLN
Status: DC | PRN
  Administered 2019-05-11: 2000 mL

## 2019-05-11 MED ORDER — FENTANYL CITRATE (PF) 100 MCG/2ML IJ SOLN: 25.0000 ug | INTRAMUSCULAR | Status: DC | PRN

## 2019-05-11 MED ORDER — MENTHOL 3 MG MT LOZG
1.0000 | LOZENGE | OROMUCOSAL | Status: DC | PRN
  Filled 2019-05-11: qty 9

## 2019-05-11 MED ORDER — ROSUVASTATIN CALCIUM 10 MG PO TABS
20.0000 mg | ORAL_TABLET | Freq: Every day | ORAL | Status: DC
  Administered 2019-05-11 – 2019-05-12 (×2): 20 mg via ORAL
  Filled 2019-05-11 (×2): qty 2

## 2019-05-11 MED ORDER — HYDROCODONE-ACETAMINOPHEN 5-325 MG PO TABS
1.0000 | ORAL_TABLET | ORAL | Status: DC | PRN
  Administered 2019-05-11 – 2019-05-13 (×4): 1 via ORAL
  Filled 2019-05-11 (×3): qty 1

## 2019-05-11 MED ORDER — PROPOFOL 500 MG/50ML IV EMUL
INTRAVENOUS | Status: DC | PRN
  Administered 2019-05-11: 20 ug/kg/min via INTRAVENOUS

## 2019-05-11 MED ORDER — PHENYLEPHRINE HCL (PRESSORS) 10 MG/ML IV SOLN
INTRAVENOUS | Status: AC
  Filled 2019-05-11: qty 1

## 2019-05-11 MED ORDER — LACTATED RINGERS IV SOLN: INTRAVENOUS | Status: DC

## 2019-05-11 MED ORDER — HYDROCODONE-ACETAMINOPHEN 7.5-325 MG PO TABS: 1.0000 | ORAL_TABLET | ORAL | Status: DC | PRN

## 2019-05-11 MED ORDER — METFORMIN HCL 500 MG PO TABS
500.0000 mg | ORAL_TABLET | Freq: Two times a day (BID) | ORAL | Status: DC
  Administered 2019-05-11 – 2019-05-13 (×4): 500 mg via ORAL
  Filled 2019-05-11 (×4): qty 1

## 2019-05-11 MED ORDER — ROPIVACAINE HCL 5 MG/ML IJ SOLN
INTRAMUSCULAR | Status: AC
  Filled 2019-05-11: qty 30

## 2019-05-11 MED ORDER — SODIUM CHLORIDE 0.9 % IV SOLN
INTRAVENOUS | Status: DC
  Administered 2019-05-11: 07:00:00 via INTRAVENOUS

## 2019-05-11 MED ORDER — FLUTICASONE PROPIONATE 50 MCG/ACT NA SUSP
1.0000 | Freq: Every day | NASAL | Status: DC
  Administered 2019-05-11 – 2019-05-13 (×3): 1 via NASAL
  Filled 2019-05-11: qty 16

## 2019-05-11 MED ORDER — LACTATED RINGERS IV SOLN
INTRAVENOUS | Status: DC
  Administered 2019-05-11: 12:00:00 via INTRAVENOUS

## 2019-05-11 MED ORDER — IRBESARTAN 150 MG PO TABS
75.0000 mg | ORAL_TABLET | Freq: Every day | ORAL | Status: DC
  Administered 2019-05-11 – 2019-05-13 (×3): 75 mg via ORAL
  Filled 2019-05-11 (×3): qty 1

## 2019-05-11 MED ORDER — BUPIVACAINE LIPOSOME 1.3 % IJ SUSP
INTRAMUSCULAR | Status: AC
  Filled 2019-05-11: qty 20

## 2019-05-11 MED ORDER — PANTOPRAZOLE SODIUM 40 MG PO TBEC
40.0000 mg | DELAYED_RELEASE_TABLET | Freq: Every day | ORAL | Status: DC
  Administered 2019-05-12 – 2019-05-13 (×2): 40 mg via ORAL
  Filled 2019-05-11: qty 1

## 2019-05-11 MED ORDER — GABAPENTIN 100 MG PO CAPS: 100.0000 mg | ORAL_CAPSULE | Freq: Two times a day (BID) | ORAL | Status: DC | PRN

## 2019-05-11 MED ORDER — SODIUM CHLORIDE FLUSH 0.9 % IV SOLN
INTRAVENOUS | Status: AC
  Filled 2019-05-11: qty 40

## 2019-05-11 MED ORDER — PHENOL 1.4 % MT LIQD
1.0000 | OROMUCOSAL | Status: DC | PRN
  Filled 2019-05-11: qty 177

## 2019-05-11 MED ORDER — BUPIVACAINE HCL (PF) 0.5 % IJ SOLN
INTRAMUSCULAR | Status: DC | PRN
  Administered 2019-05-11: 3 mL via INTRATHECAL

## 2019-05-11 MED ORDER — VITAMIN B-12 1000 MCG PO TABS
3000.0000 ug | ORAL_TABLET | Freq: Every day | ORAL | Status: DC
  Administered 2019-05-11 – 2019-05-13 (×3): 3000 ug via ORAL
  Filled 2019-05-11 (×3): qty 3

## 2019-05-11 MED ORDER — CEFAZOLIN SODIUM-DEXTROSE 2-4 GM/100ML-% IV SOLN
2.0000 g | Freq: Four times a day (QID) | INTRAVENOUS | Status: AC
  Administered 2019-05-11 (×2): 2 g via INTRAVENOUS
  Filled 2019-05-11 (×2): qty 100

## 2019-05-11 MED ORDER — CHLORHEXIDINE GLUCONATE 4 % EX LIQD: 60.0000 mL | Freq: Once | CUTANEOUS | Status: DC

## 2019-05-11 MED ORDER — ONDANSETRON HCL 4 MG/2ML IJ SOLN
INTRAMUSCULAR | Status: DC | PRN
  Administered 2019-05-11: 4 mg via INTRAVENOUS

## 2019-05-11 MED ORDER — GLYCOPYRROLATE 0.2 MG/ML IJ SOLN
INTRAMUSCULAR | Status: AC
  Filled 2019-05-11: qty 1

## 2019-05-11 MED ORDER — CEFAZOLIN SODIUM-DEXTROSE 2-4 GM/100ML-% IV SOLN
INTRAVENOUS | Status: AC
  Filled 2019-05-11: qty 100

## 2019-05-11 MED ORDER — LIDOCAINE HCL (PF) 2 % IJ SOLN
INTRAMUSCULAR | Status: AC
  Filled 2019-05-11: qty 10

## 2019-05-11 MED ORDER — ONDANSETRON HCL 4 MG/2ML IJ SOLN
4.0000 mg | Freq: Four times a day (QID) | INTRAMUSCULAR | Status: DC | PRN
  Administered 2019-05-11: 16:00:00 4 mg via INTRAVENOUS
  Filled 2019-05-11: qty 2

## 2019-05-11 MED ORDER — MORPHINE SULFATE (PF) 2 MG/ML IV SOLN: 0.5000 mg | INTRAVENOUS | Status: DC | PRN

## 2019-05-11 MED ORDER — PHENYLEPHRINE HCL (PRESSORS) 10 MG/ML IV SOLN
INTRAVENOUS | Status: DC | PRN
  Administered 2019-05-11 (×2): 100 ug via INTRAVENOUS

## 2019-05-11 MED ORDER — ACETAMINOPHEN 325 MG PO TABS: 325.0000 mg | ORAL_TABLET | Freq: Four times a day (QID) | ORAL | Status: DC | PRN

## 2019-05-11 MED ORDER — ALBUTEROL SULFATE (2.5 MG/3ML) 0.083% IN NEBU: 2.5000 mg | INHALATION_SOLUTION | RESPIRATORY_TRACT | Status: DC | PRN

## 2019-05-11 MED ORDER — POVIDONE-IODINE 10 % EX SWAB: 2.0000 "application " | Freq: Once | CUTANEOUS | Status: DC

## 2019-05-11 MED ORDER — GABAPENTIN 300 MG PO CAPS
ORAL_CAPSULE | ORAL | Status: AC
  Administered 2019-05-11: 07:00:00 300 mg via ORAL
  Filled 2019-05-11: qty 1

## 2019-05-11 MED ORDER — ALBUTEROL SULFATE (2.5 MG/3ML) 0.083% IN NEBU: 3.0000 mL | INHALATION_SOLUTION | RESPIRATORY_TRACT | Status: DC | PRN

## 2019-05-11 MED ORDER — MIDAZOLAM HCL 2 MG/2ML IJ SOLN
INTRAMUSCULAR | Status: AC
  Filled 2019-05-11: qty 2

## 2019-05-11 MED ORDER — METOCLOPRAMIDE HCL 5 MG/ML IJ SOLN: 5.0000 mg | Freq: Three times a day (TID) | INTRAMUSCULAR | Status: DC | PRN

## 2019-05-11 MED ORDER — SODIUM CHLORIDE 0.9 % IV SOLN
INTRAVENOUS | Status: DC | PRN
  Administered 2019-05-11: 60 mL

## 2019-05-11 MED ORDER — DOCUSATE SODIUM 100 MG PO CAPS
100.0000 mg | ORAL_CAPSULE | Freq: Two times a day (BID) | ORAL | Status: DC
  Administered 2019-05-11 – 2019-05-13 (×5): 100 mg via ORAL
  Filled 2019-05-11 (×5): qty 1

## 2019-05-11 MED ORDER — VALSARTAN-HYDROCHLOROTHIAZIDE 80-12.5 MG PO TABS: 1.0000 | ORAL_TABLET | ORAL | Status: DC

## 2019-05-11 MED ORDER — METOPROLOL SUCCINATE ER 50 MG PO TB24
50.0000 mg | ORAL_TABLET | ORAL | Status: DC
  Administered 2019-05-12 – 2019-05-13 (×2): 50 mg via ORAL
  Filled 2019-05-11 (×2): qty 1

## 2019-05-11 MED ORDER — CALCIUM CARBONATE-VITAMIN D 500-200 MG-UNIT PO TABS
ORAL_TABLET | Freq: Every day | ORAL | Status: DC
  Administered 2019-05-12 – 2019-05-13 (×2): 1 via ORAL
  Filled 2019-05-11 (×2): qty 1

## 2019-05-11 MED ORDER — LIDOCAINE HCL (PF) 1 % IJ SOLN
INTRAMUSCULAR | Status: AC
  Filled 2019-05-11: qty 5

## 2019-05-11 MED ORDER — BUPIVACAINE HCL (PF) 0.5 % IJ SOLN
INTRAMUSCULAR | Status: AC
  Filled 2019-05-11: qty 10

## 2019-05-11 MED ORDER — BACITRACIN 50000 UNITS IM SOLR
INTRAMUSCULAR | Status: AC
  Filled 2019-05-11: qty 2

## 2019-05-11 SURGICAL SUPPLY — 63 items
APL PRP STRL LF DISP 70% ISPRP (MISCELLANEOUS) ×2
BASEPLATE TIBIAL LT SZ3 (Knees) ×1 IMPLANT
BLADE SAW 18WX90L 1.27 THK (BLADE) ×2 IMPLANT
BLADE SAW SAG 25X90X1.19 (BLADE) ×2 IMPLANT
BOWL CEMENT MIX W/ADAPTER (MISCELLANEOUS) ×2 IMPLANT
BRUSH SCRUB EZ  4% CHG (MISCELLANEOUS) ×2
BRUSH SCRUB EZ 4% CHG (MISCELLANEOUS) ×2 IMPLANT
BSPLAT TIB 3 CMNT M TPR KN LT (Knees) ×1 IMPLANT
CANISTER SUCT 1200ML W/VALVE (MISCELLANEOUS) ×2 IMPLANT
CANISTER SUCT 3000ML PPV (MISCELLANEOUS) ×4 IMPLANT
CEMENT BONE 1-PACK (Cement) ×4 IMPLANT
CHLORAPREP W/TINT 26 (MISCELLANEOUS) ×4 IMPLANT
COMP FEMORAL SZ 4 LEFT NARROW (Orthopedic Implant) ×2 IMPLANT
COMP PATELLA GENESIS 29 OVAL (Orthopedic Implant) ×2 IMPLANT
COMPONENT FEMRL SZ4 LT NARROW (Orthopedic Implant) IMPLANT
COMPONENT PTLLA GENS 29 OVAL (Orthopedic Implant) IMPLANT
COOLER POLAR GLACIER W/PUMP (MISCELLANEOUS) ×2 IMPLANT
COVER WAND RF STERILE (DRAPES) ×2 IMPLANT
CUFF TOURN SGL QUICK 30 (TOURNIQUET CUFF) ×2
CUFF TRNQT CYL 30X4X21-28X (TOURNIQUET CUFF) ×1 IMPLANT
DRAPE 3/4 80X56 (DRAPES) ×4 IMPLANT
DRAPE INCISE IOBAN 66X60 STRL (DRAPES) ×2 IMPLANT
ELECT REM PT RETURN 9FT ADLT (ELECTROSURGICAL) ×2
ELECTRODE REM PT RTRN 9FT ADLT (ELECTROSURGICAL) ×1 IMPLANT
GAUZE SPONGE 4X4 12PLY STRL (GAUZE/BANDAGES/DRESSINGS) ×2 IMPLANT
GAUZE XEROFORM 1X8 LF (GAUZE/BANDAGES/DRESSINGS) ×2 IMPLANT
GLOVE BIO SURGEON STRL SZ8 (GLOVE) ×2 IMPLANT
GLOVE BIOGEL PI IND STRL 8.5 (GLOVE) ×1 IMPLANT
GLOVE BIOGEL PI INDICATOR 8.5 (GLOVE) ×1
GLOVE INDICATOR 8.0 STRL GRN (GLOVE) ×2 IMPLANT
GLOVE SURG ORTHO 8.0 STRL STRW (GLOVE) ×6 IMPLANT
GOWN STRL REUS W/ TWL LRG LVL3 (GOWN DISPOSABLE) ×1 IMPLANT
GOWN STRL REUS W/ TWL XL LVL3 (GOWN DISPOSABLE) ×1 IMPLANT
GOWN STRL REUS W/TWL LRG LVL3 (GOWN DISPOSABLE) ×2
GOWN STRL REUS W/TWL XL LVL3 (GOWN DISPOSABLE) ×2
HOOD PEEL AWAY FLYTE STAYCOOL (MISCELLANEOUS) ×6 IMPLANT
INSERT ARTI HI FLEX 3-4 SZ10 (Insert) ×1 IMPLANT
IV NS 1000ML (IV SOLUTION) ×2
IV NS 1000ML BAXH (IV SOLUTION) ×1 IMPLANT
KIT TURNOVER KIT A (KITS) ×2 IMPLANT
MAT ABSORB  FLUID 56X50 GRAY (MISCELLANEOUS) ×1
MAT ABSORB FLUID 56X50 GRAY (MISCELLANEOUS) ×1 IMPLANT
NDL SAFETY ECLIPSE 18X1.5 (NEEDLE) ×1 IMPLANT
NDL SPNL 20GX3.5 QUINCKE YW (NEEDLE) ×1 IMPLANT
NEEDLE HYPO 18GX1.5 SHARP (NEEDLE) ×2
NEEDLE SPNL 20GX3.5 QUINCKE YW (NEEDLE) ×2 IMPLANT
NS IRRIG 1000ML POUR BTL (IV SOLUTION) ×2 IMPLANT
PACK TOTAL KNEE (MISCELLANEOUS) ×2 IMPLANT
PAD DE MAYO PRESSURE PROTECT (MISCELLANEOUS) ×2 IMPLANT
PAD WRAPON POLAR KNEE (MISCELLANEOUS) ×1 IMPLANT
PULSAVAC PLUS IRRIG FAN TIP (DISPOSABLE) ×2
STAPLER SKIN PROX 35W (STAPLE) ×2 IMPLANT
SUCTION FRAZIER HANDLE 10FR (MISCELLANEOUS) ×1
SUCTION TUBE FRAZIER 10FR DISP (MISCELLANEOUS) ×1 IMPLANT
SUT DVC 2 QUILL PDO  T11 36X36 (SUTURE) ×1
SUT DVC 2 QUILL PDO T11 36X36 (SUTURE) ×1 IMPLANT
SUT VIC AB 2-0 CT1 18 (SUTURE) ×2 IMPLANT
SUT VIC AB 2-0 CT1 27 (SUTURE)
SUT VIC AB 2-0 CT1 TAPERPNT 27 (SUTURE) IMPLANT
SUT VIC AB PLUS 45CM 1-MO-4 (SUTURE) ×2 IMPLANT
SYR 30ML LL (SYRINGE) ×6 IMPLANT
TIP FAN IRRIG PULSAVAC PLUS (DISPOSABLE) ×1 IMPLANT
WRAPON POLAR PAD KNEE (MISCELLANEOUS) ×2

## 2019-05-11 NOTE — Evaluation (Signed)
Physical Therapy Evaluation Patient Details Name: Anne Kerr MRN: [id number] DOB: Sep 13, 1951 Today's Date: 05/11/2019   History of Present Illness  Patient is 68 yo female s/p L TKA. PMH of ashtma, HTN, GERD, DMII, uterine cx.  Clinical Impression  Patient easily woken, agreeable to PT, reported pain at 6-7/10 of L knee, returned of sensation assessed. Patient stated prior to admission, independent at baseline and lives with her husband who is available 24/7.   Patient performed therapeutic exercises with verbal and tactile cues, able to perform SLR on LLE without physical assist (2 sets of 5 reps). Supine to sit with minA for LLE management, and sit <> stand with RW and CGA. Pt instructed in step to gait pattern in order to transfer to [facility] and then to recliner. Pt also instructed in hand placement to maximize safety. During and after mobility pt had complaints of nausea, RN notified.  Overall the patient demonstrated deficits (see "PT Problem List") that impede the patient's functional abilities, safety, and mobility and would benefit from skilled PT intervention. Recommendation is HHPT with supervision for mobility/OOB pending pt progress.     Follow Up Recommendations Home health PT;Supervision for mobility/OOB    Equipment Recommendations  Rolling walker with 5" wheels    Recommendations for Other Services       Precautions / Restrictions Precautions Precautions: Fall Restrictions Weight Bearing Restrictions: Yes LLE Weight Bearing: Weight bearing as tolerated      Mobility  Bed Mobility Overal bed mobility: Needs Assistance Bed Mobility: Supine to Sit     Supine to sit: Min assist     General bed mobility comments: for LLE management  Transfers Overall transfer level: Needs assistance Equipment used: Rolling walker (2 wheeled) Transfers: Sit to/from Stand Sit to Stand: Min guard         General transfer comment: heavy use of UE, decreased weight bearing on  LLE, improved with time  Ambulation/Gait Ambulation/Gait assistance: Min guard Gait Distance (Feet): 2 Feet Assistive device: Rolling walker (2 wheeled)       General Gait Details: step to gait pattern, use of RW, decreased weight bearing of LLE.  Stairs            Wheelchair Mobility    Modified Rankin (Stroke Patients Only)       Balance Overall balance assessment: Needs assistance Sitting-balance support: Feet supported Sitting balance-Leahy Scale: Fair     Standing balance support: Bilateral upper extremity supported Standing balance-Leahy Scale: Poor                               Pertinent Vitals/Pain Pain Assessment: 0-10 Pain Score: 7  Pain Location: L knee Pain Descriptors / Indicators: Aching;Grimacing;Moaning Pain Intervention(s): Limited activity within patient's tolerance    Home Living Family/patient expects to be discharged to:: Private residence Living Arrangements: Spouse/significant other Available Help at Discharge: Family Type of Home: House Home Access: Stairs to enter Entrance Stairs-Rails: Right;Left;Can reach both Technical brewer of Steps: 4 Home Layout: One level Home Equipment: Cane - single point      Prior Function Level of Independence: Independent               Hand Dominance   Dominant Hand: Right    Extremity/Trunk Assessment   Upper Extremity Assessment Upper Extremity Assessment: Generalized weakness    Lower Extremity Assessment Lower Extremity Assessment: RLE deficits/detail;LLE deficits/detail RLE Deficits / Details: WFLs LLE Deficits / Details:  able to perform SLR independently       Communication   Communication: No difficulties  Cognition Arousal/Alertness: Awake/alert Behavior During Therapy: WFL for tasks assessed/performed Overall Cognitive Status: Within Functional Limits for tasks assessed                                        General Comments       Exercises Total Joint Exercises Ankle Circles/Pumps: AROM;10 reps;Both Quad Sets: AROM;Both;10 reps Gluteal Sets: AROM;Both;10 reps Heel Slides: AAROM;Left;10 reps Hip ABduction/ADduction: AROM;Left;10 reps Straight Leg Raises: AROM;Left;10 reps Goniometric ROM: -7 extension to 60deg Other Exercises Other Exercises: transfer to [facility], CGA and vues for hand placement   Assessment/Plan    PT Assessment Patient needs continued PT services  PT Problem List Decreased strength;Decreased mobility;Decreased safety awareness;Decreased balance;Decreased knowledge of use of DME;Decreased activity tolerance       PT Treatment Interventions DME instruction;Therapeutic exercise;Gait training;Balance training;Neuromuscular re-education;Stair training;Functional mobility training;Therapeutic activities;Patient/family education    PT Goals (Current goals can be found in the Care Plan section)  Acute Rehab PT Goals Patient Stated Goal: to decrease pain PT Goal Formulation: With patient Time For Goal Achievement: 05/25/19 Potential to Achieve Goals: Good    Frequency BID   Barriers to discharge        Co-evaluation               AM-PAC PT "6 Clicks" Mobility  Outcome Measure Help needed turning from your back to your side while in a flat bed without using bedrails?: A Little Help needed moving from lying on your back to sitting on the side of a flat bed without using bedrails?: A Little Help needed moving to and from a bed to a chair (including a wheelchair)?: A Little Help needed standing up from a chair using your arms (e.g., wheelchair or bedside chair)?: A Little Help needed to walk in hospital room?: A Lot Help needed climbing 3-5 steps with a railing? : A Lot 6 Click Score: 16    End of Session Equipment Utilized During Treatment: Gait belt Activity Tolerance: Patient tolerated treatment well Patient left: in chair;with chair alarm set;with call bell/phone within reach;with  SCD's reapplied Nurse Communication: Mobility status PT Visit Diagnosis: Unsteadiness on feet (R26.81);Muscle weakness (generalized) (M62.81);Difficulty in walking, not elsewhere classified (R26.2);Pain Pain - Right/Left: Left Pain - part of body: Knee    Time: 0313-0348 PT Time Calculation (min) (ACUTE ONLY): 35 min   Charges:   PT Evaluation $PT Eval Low Complexity: 1 Low PT Treatments $Therapeutic Exercise: 23-37 mins       Lieutenant Diego PT, DPT 4:36 PM,05/11/19 [phone]

## 2019-05-11 NOTE — Anesthesia Preprocedure Evaluation (Signed)
Anesthesia Evaluation  Patient identified by MRN, date of birth, ID band Patient awake    Reviewed: Allergy & Precautions, H&P , NPO status , Patient's Chart, lab work & pertinent test results, reviewed documented beta blocker date and time   History of Anesthesia Complications (+) history of anesthetic complications  Airway Mallampati: III   Neck ROM: full    Dental  (+) Poor Dentition, Teeth Intact   Pulmonary asthma ,    Pulmonary exam normal        Cardiovascular Exercise Tolerance: Poor hypertension, On Medications negative cardio ROS Normal cardiovascular exam Rhythm:regular Rate:Normal     Neuro/Psych TIAnegative psych ROS   GI/Hepatic Neg liver ROS, GERD  ,  Endo/Other  negative endocrine ROSdiabetes, Well Controlled, Type 2, Oral Hypoglycemic Agents  Renal/GU negative Renal ROS  negative genitourinary   Musculoskeletal   Abdominal   Peds  Hematology negative hematology ROS (+)   Anesthesia Other Findings Past Medical History: No date: Arthritis No date: Asthma     Comment:  allergy induced No date: Complication of anesthesia     Comment:  hard to wake up No date: Diabetes mellitus without complication (HCC) No date: GERD (gastroesophageal reflux disease) No date: High blood pressure No date: History of kidney stones     Comment:  h/o No date: Hyperlipidemia No date: Leaky heart valve No date: Leaky heart valve No date: Tachycardia No date: TIA (transient ischemic attack)     Comment:  no residual side effects-pt states she didnt even know-               a doctor told her this 1989: Uterine cancer ([place]) Past Surgical History: No date: COLONOSCOPY No date: KNEE ARTHROSCOPY; Right No date: NASAL SINUS SURGERY     Comment:  x 4 No date: PARTIAL HYSTERECTOMY No date: SINOSCOPY BMI    Body Mass Index: 35.03 kg/m     Reproductive/Obstetrics negative OB ROS                              Anesthesia Physical Anesthesia Plan  ASA: III  Anesthesia Plan: Spinal and Regional   Post-op Pain Management:  Regional for Post-op pain   Induction:   PONV Risk Score and Plan:   Airway Management Planned:   Additional Equipment:   Intra-op Plan:   Post-operative Plan:   Informed Consent: I have reviewed the patients History and Physical, chart, labs and discussed the procedure including the risks, benefits and alternatives for the proposed anesthesia with the patient or authorized representative who has indicated his/her understanding and acceptance.     Dental Advisory Given  Plan Discussed with: CRNA  Anesthesia Plan Comments:         Anesthesia Quick Evaluation

## 2019-05-11 NOTE — Transfer of Care (Signed)
Immediate Anesthesia Transfer of Care Note  Patient: Anne Kerr  Procedure(s) Performed: TOTAL KNEE ARTHROPLASTY (Left Knee)  Patient Location: PACU  Anesthesia Type:General  Level of Consciousness: sedated  Airway & Oxygen Therapy: Patient Spontanous Breathing and Patient connected to face mask oxygen  Post-op Assessment: Report given to RN and Post -op Vital signs reviewed and stable  Post vital signs: Reviewed and stable  Last Vitals:  Vitals Value Taken Time  BP 101/70 05/11/19 0946  Temp    Pulse 67 05/11/19 0950  Resp 17 05/11/19 0950  SpO2 100 % 05/11/19 0950  Vitals shown include unvalidated device data.  Last Pain:  Vitals:   05/11/19 0619  TempSrc: Oral         Complications: No apparent anesthesia complications

## 2019-05-11 NOTE — Anesthesia Procedure Notes (Signed)
Spinal  Patient location during procedure: OR Staffing Anesthesiologist: Molli Barrows, MD Resident/CRNA: Rolla Plate, CRNA Performed: resident/CRNA  Preanesthetic Checklist Completed: patient identified, site marked, surgical consent, pre-op evaluation, timeout performed, IV checked, risks and benefits discussed and monitors and equipment checked Spinal Block Patient position: sitting Prep: ChloraPrep and site prepped and draped Patient monitoring: heart rate, continuous pulse ox, blood pressure and cardiac monitor Approach: midline Location: L4-5 Injection technique: single-shot Needle Needle type: Introducer and Pencan  Needle gauge: 24 G Needle length: 9 cm Additional Notes Negative paresthesia. Negative blood return. Positive free-flowing CSF. Expiration date of kit checked and confirmed. Patient tolerated procedure well, without complications.

## 2019-05-11 NOTE — H&P (Signed)
The patient has been re-examined, and the chart reviewed, and there have been no interval changes to the documented history and physical.  Plan a left total knee replacement today.  Anesthesia is consulted regarding a peripheral nerve block for post-operative pain.  The risks, benefits, and alternatives have been discussed at length, and the patient is willing to proceed.     

## 2019-05-11 NOTE — Op Note (Signed)
DATE OF SURGERY:  05/11/2019 TIME: 9:32 AM  PATIENT NAME:  Anne Kerr   AGE: 68 y.o.    PRE-OPERATIVE DIAGNOSIS:  M17.12 unilateral primary osteoarthritis left knee  POST-OPERATIVE DIAGNOSIS:  Same  PROCEDURE:  Procedure(s): TOTAL KNEE ARTHROPLASTY, LEFT  SURGEON:  Lovell Sheehan, MD   ASSISTANT:  Carlynn Spry, PA-C  OPERATIVE IMPLANTS: Tamala Julian [employer] Nephew, Cruciate Retaining Oxinium Femoral component size 4N, Fixed Bearing Tray size 3, Patella polyethylene 3-peg oval button size 29 mm, with a 10 mm HighFlex insert.   PREOPERATIVE INDICATIONS:  Anne Kerr is an 68 y.o. 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  Radiographs have demonstrated tricompartmental osteoarthritis joint space narrowing, osteophytes, subchondral sclerosis and cyst formation.  The risks, benefits, and alternatives were discussed at length including but not limited to the risks of infection, bleeding, nerve or blood vessel injury, knee stiffness, fracture, dislocation, loosening or failure of the hardware and the need for further surgery. Medical risks include but not limited to DVT and pulmonary embolism, myocardial infarction, stroke, pneumonia, respiratory failure and death. I discussed these risks with the patient in my office prior to the date of surgery. They understood these risks and were willing to proceed.  OPERATIVE FINDINGS AND UNIQUE ASPECTS OF THE CASE:  All three compartments with advanced and severe degenerative changes, large osteophytes and an abundance of synovial fluid. Significant deformity was also noted. A decision was made to proceed with total knee arthroplasty.   OPERATIVE DESCRIPTION:  The patient was brought to  the operative room and placed in a supine position after undergoing placement of a general anesthetic. IV antibiotics were given. Patient received tranexamic acid. The lower extremity was prepped and draped in the usual sterile fashion.  A time out was performed to verify the patient's name, date of birth, medical record number, correct site of surgery and correct procedure to be performed. The timeout was also used to confirm the patient received antibiotics and that appropriate instruments, implants and radiographs studies were available in the room.  The leg was elevated and exsanguinated with an Esmarch and the tourniquet was inflated to 275 mmHg.  A midline incision was made over the left knee.. A medial parapatellar arthrotomy was then made and the patella subluxed laterally and the knee was brought into 90 of flexion. Hoffa's fat pad along with the anterior cruciate ligament was resected and the medial joint line was exposed.  Attention was then turned to preparation of the patella. The thickness of the patella was measured with a caliper, the diameter measured with the patella templates.  The patella resection was then made with an oscillating saw using the patella cutting guide.  The 29 mm button fit appropriately.  3 peg holes for the patella component were then drilled.  The extramedullary tibial cutting guide was then placed using the anterior tibial crest and second ray of the foot as a reference.  The tibial cutting guide was adjusted to allow for appropriate posterior slope.  The tibial cutting block was pinned into position. The slotted stylus was used to measure the proximal tibial resection of 9 mm off the high lateral side. Care was taken during the tibial resection to protect the medial and collateral ligaments.  The resected tibial bone was removed.  The distal femur was resected using the Visionaire cutting guide.  Care was taken to protect the collateral ligaments during distal  femoral resection.  The distal femoral resection was performed with an oscillating saw. The femoral cutting guide was then removed. Extension gap was measured with a 10 mm spacer block and alignment and extension was confirmed using a long alignment rod. The femur was sized to be a 4 narrow. Rotation of the referencing guide was checked with the epicondylar axis and Whitesides line. Then the 4-in-1 cutting jig was then applied to the distal femur. A stylus was used to confirm that the anterior femur would not be notched.   Then the anterior, posterior and chamfer femoral cuts were then made with an oscillating saw.  The knee was distracted and all posterior osteophytes were removed.  The flexion gap was then measured with a flexion spacer block and long alignment rod and was found to be symmetric with the extension gap and perpendicular to mechanical axis of the tibia.  The proximal tibia plateau was then sized with trial trays. The best coverage was achieved with a size 3. This tibial tray was then pinned into position. The proximal tibia was then prepared with the keel punch.  After tibial preparation was completed, all trial components were inserted with polyethylene trials. The knee achieved full extension and flexed to 120 degrees. Ligament were stable to varus and valgus at full extension as well as 30, 60 and 90 degrees of flexion.   The trials were then placed. Knee was taken through a full range of motion and deemed to be stable with the trial components. All trial components were then removed.  The joint was copiously irrigated with pulse lavage.  The final total knee arthroplasty components were then cemented into place. The knee was held in extension while cement was allowed to cure.The knee was taken through a range of motion and the patella tracked well and the knee was again irrigated copiously.  The knee capsule was then injected with Exparel.  The medial arthrotomy was closed with #1 Vicryl  and #2 Quill. The subcutaneous tissue closed with  2-0 vicryl, and skin approximated with staples.  A dry sterile and compressive dressing was applied.  A Polar Care was applied to the operative knee.  The patient was awakened and brought to the PACU in stable and satisfactory condition.  All sharp, lap and instrument counts were correct at the conclusion the case. I spoke with the patient's family in the postop consultation room to let them know the case had been performed without complication and the patient was stable in recovery room.   Total tourniquet time was 49 minutes.

## 2019-05-11 NOTE — Anesthesia Postprocedure Evaluation (Signed)
Anesthesia Post Note  Patient: Anne Kerr  Procedure(s) Performed: TOTAL KNEE ARTHROPLASTY (Left Knee)  Patient location during evaluation: PACU Anesthesia Type: Regional Level of consciousness: oriented and awake and alert Pain management: pain level controlled Vital Signs Assessment: post-procedure vital signs reviewed and stable Respiratory status: spontaneous breathing, respiratory function stable and patient connected to nasal cannula oxygen Cardiovascular status: blood pressure returned to baseline and stable Postop Assessment: no headache, no backache and no apparent nausea or vomiting Anesthetic complications: no     Last Vitals:  Vitals:   05/11/19 1057 05/11/19 1102  BP: 102/76 109/65  Pulse: 67 (!) 55  Resp: 12 20  Temp:    SpO2: 100% 100%    Last Pain:  Vitals:   05/11/19 1057  TempSrc:   PainSc: Asleep                 Molli Barrows

## 2019-05-11 NOTE — Anesthesia Procedure Notes (Signed)
Anesthesia Regional Block: Adductor canal block   Pre-Anesthetic Checklist: ,, timeout performed, Correct Patient, Correct Site, Correct Laterality, Correct Procedure, Correct Position, site marked, Risks and benefits discussed,  Surgical consent,  Pre-op evaluation,  At surgeon's request and post-op pain management  Laterality: Left  Prep: chloraprep       Needles:   Needle Type: Echogenic Stimulator Needle     Needle Length: 10cm  Needle Gauge: 20     Additional Needles:   Procedures: Doppler guided,,,, ultrasound used (permanent image in chart),,,,  Narrative:  Start time: 05/11/2019 10:50 AM End time: 05/11/2019 11:04 AM  Performed by: Personally  Anesthesiologist: Molli Barrows, MD  Additional Notes: Tolerated well and neg aspiration.  Easy injection with US guidance.  No discomfort or iv sx.  JA

## 2019-05-11 NOTE — Anesthesia Post-op Follow-up Note (Signed)
Anesthesia QCDR form completed.        

## 2019-05-12 LAB — CBC
HCT: 36.2 % (ref 36.0–46.0)
Hemoglobin: 11.6 g/dL — ABNORMAL LOW (ref 12.0–15.0)
MCH: 26.2 pg (ref 26.0–34.0)
MCHC: 32 g/dL (ref 30.0–36.0)
MCV: 81.7 fL (ref 80.0–100.0)
Platelets: 183 10*3/uL (ref 150–400)
RBC: 4.43 MIL/uL (ref 3.87–5.11)
RDW: 14.4 % (ref 11.5–15.5)
WBC: 8 10*3/uL (ref 4.0–10.5)
nRBC: 0 % (ref 0.0–0.2)

## 2019-05-12 LAB — BASIC METABOLIC PANEL
Anion gap: 9 (ref 5–15)
BUN: 10 mg/dL (ref 8–23)
CO2: 25 mmol/L (ref 22–32)
Calcium: 8.6 mg/dL — ABNORMAL LOW (ref 8.9–10.3)
Chloride: 102 mmol/L (ref 98–111)
Creatinine, Ser: 0.77 mg/dL (ref 0.44–1.00)
GFR calc Af Amer: >60 mL/min (ref >60)
GFR calc non Af Amer: >60 mL/min (ref >60)
Glucose, Bld: 160 mg/dL — ABNORMAL HIGH (ref 70–99)
Potassium: 3.9 mmol/L (ref 3.5–5.1)
Sodium: 136 mmol/L (ref 135–145)

## 2019-05-12 LAB — GLUCOSE, CAPILLARY
Glucose-Capillary: 153 mg/dL — ABNORMAL HIGH (ref 70–99)
Glucose-Capillary: 200 mg/dL — ABNORMAL HIGH (ref 70–99)

## 2019-05-12 NOTE — Progress Notes (Signed)
PT Cancellation Note  Patient Details Name: Anne Kerr MRN: [id number] DOB: October 02, 1950   Cancelled Treatment:    Reason Eval/Treat Not Completed: Pain limiting ability to participate   Pain medication obtained prior to attempt.  Pt had used SBC and up to chair before I returned and was in general discomfort from mobility and unable to participate at this time.  Will return after lunch.   Chesley Noon 05/12/2019, 2:10 PM

## 2019-05-12 NOTE — Progress Notes (Signed)
Physical Therapy Treatment Patient Details Name: Anne Kerr MRN: [id number] DOB: 1950/12/05 Today's Date: 05/12/2019    History of Present Illness Patient is 68 yo female s/p L TKA. PMH of ashtma, HTN, GERD, DMII, uterine cx.    PT Comments    Upon arrival to room, chair alarm going off and husband helping to commode at chairside with polar care on, and no walker.  Education provided to pt and husband to call for assist prior to mobility and that using walker is the safest method for transferring at this time.  While awaiting pt to finish by doorway, he again assisted pt to stand and to recliner without walker.  Education again provided.  Stood and was able to ambulate 100' and progress to step-through gait this session.  Returned to bed after and positioned for comfort.  Will need stair training in am prior to discharge.   Follow Up Recommendations  Home health PT;Supervision for mobility/OOB     Equipment Recommendations  Rolling walker with 5" wheels;3in1 (PT)    Recommendations for Other Services       Precautions / Restrictions Precautions Precautions: Fall Restrictions Weight Bearing Restrictions: Yes LLE Weight Bearing: Weight bearing as tolerated    Mobility  Bed Mobility Overal bed mobility: Needs Assistance Bed Mobility: Sit to Supine       Sit to supine: Min assist   General bed mobility comments: in recliner and remained up after session  Transfers Overall transfer level: Needs assistance Equipment used: Rolling walker (2 wheeled) Transfers: Sit to/from Stand Sit to Stand: Min guard            Ambulation/Gait Ambulation/Gait assistance: Min guard;Min Web designer (Feet): 100 Feet Assistive device: Rolling walker (2 wheeled) Gait Pattern/deviations: Step-to pattern;Step-through pattern;Decreased stance time - left Gait velocity: decreased   General Gait Details: progressed to step-through   Estate agent    Modified Rankin (Stroke Patients Only)       Balance Overall balance assessment: Needs assistance Sitting-balance support: Feet supported Sitting balance-Leahy Scale: Good     Standing balance support: Bilateral upper extremity supported Standing balance-Leahy Scale: Fair                              Cognition Arousal/Alertness: Awake/alert Behavior During Therapy: WFL for tasks assessed/performed Overall Cognitive Status: Within Functional Limits for tasks assessed                                        Exercises Total Joint Exercises Ankle Circles/Pumps: AROM;10 reps;Both Quad Sets: AROM;Both;10 reps Heel Slides: AAROM;Left;10 reps;Supine Hip ABduction/ADduction: AROM;Left;10 reps;Supine Straight Leg Raises: AROM;Left;10 reps;Supine Long Arc Quad: AROM;Left;10 reps;Seated Knee Flexion: Seated;Left;AAROM;5 reps Goniometric ROM: 4-75 Other Exercises Other Exercises: safety with mobility with pt and husband    General Comments        Pertinent Vitals/Pain Pain Assessment: Faces Faces Pain Scale: Hurts little more Pain Location: L knee Pain Descriptors / Indicators: Aching;Grimacing;Moaning Pain Intervention(s): Monitored during session;Repositioned;Ice applied    Home Living                      Prior Function            PT Goals (current goals can now be found  in the care plan section) Progress towards PT goals: Progressing toward goals    Frequency    BID      PT Plan Current plan remains appropriate    Co-evaluation              AM-PAC PT "6 Clicks" Mobility   Outcome Measure  Help needed turning from your back to your side while in a flat bed without using bedrails?: A Little Help needed moving from lying on your back to sitting on the side of a flat bed without using bedrails?: A Little Help needed moving to and from a bed to a chair (including a wheelchair)?: A Little Help needed  standing up from a chair using your arms (e.g., wheelchair or bedside chair)?: A Little Help needed to walk in hospital room?: A Little Help needed climbing 3-5 steps with a railing? : A Little 6 Click Score: 18    End of Session Equipment Utilized During Treatment: Gait belt Activity Tolerance: Patient tolerated treatment well Patient left: in bed;with call bell/phone within reach;with bed alarm set;with family/visitor present   Pain - Right/Left: Left Pain - part of body: Knee     Time: 2263-3354 PT Time Calculation (min) (ACUTE ONLY): 26 min  Charges:  $Gait Training: 8-22 mins $Therapeutic Exercise: 8-22 mins $Therapeutic Activity: 8-22 mins                     Chesley Noon, PTA 05/12/19, 3:59 PM

## 2019-05-12 NOTE — TOC Initial Note (Signed)
Transition of Care [facility]) - Initial/Assessment Note    Patient Details  Name: Anne Kerr MRN: [id number] Date of Birth: 16-Nov-1950  [facility]) CM/SW Contact:    Su Hilt, RN Phone Number: 05/12/2019, 12:41 PM  Clinical Narrative:                  Met with the patient to discuss DC plan and needs She has a PT appointment with [facility] office on the 28th but agreed to do [facility] PT until then, She chose [place], I notified Corene Cornea She needs a RW and a 3 in 1, I notified Leroy Sea He husband will provide transportation, she sees Dr Humphrey Rolls as pcp and is up to date with visits [facility] and [facility], No further needs Expected Discharge Plan: [place] Barriers to Discharge: Continued Medical Work up   Patient Goals and CMS Choice Patient states their goals for this hospitalization and ongoing recovery are:: go home CMS Medicare.gov Compare Post Acute Care list provided to:: Patient Choice offered to / list presented to : Patient  Expected Discharge Plan and Services Expected Discharge Plan: [place]   Discharge Planning Services: CM Consult Post Acute Care Choice: [place] arrangements for the past 2 months: Single Family Home                 DME Arranged: 3-N-1, Walker rolling DME Agency: [employer] Date DME Agency Contacted: 05/12/19 Time DME Agency Contacted: 66 Representative spoke with at DME Agency: [place]: PT [place]: [place] ([place]) Date [place]: 05/12/19 Time [place]: 22 Representative spoke with at [place]: Corene Cornea  Prior Living Arrangements/Services Living arrangements for the past 2 months: [place] Lives with:: Spouse Patient language and need for interpreter reviewed:: No Do you feel safe going back to the place where you live?: Yes      Need for Family Participation in Patient Care: No  (Comment) Care giver support system in place?: Yes (comment) Current home services: DME(cane) Criminal Activity/Legal Involvement Pertinent to Current Situation/Hospitalization: No - Comment as needed  Activities of Daily Living Home Assistive Devices/Equipment: Eyeglasses ADL Screening (condition at time of admission) Patient's cognitive ability adequate to safely complete daily activities?: Yes Is the patient deaf or have difficulty hearing?: No Does the patient have difficulty seeing, even when wearing glasses/contacts?: No Does the patient have difficulty concentrating, remembering, or making decisions?: No Patient able to express need for assistance with ADLs?: Yes Does the patient have difficulty dressing or bathing?: No Independently performs ADLs?: Yes (appropriate for developmental age) Does the patient have difficulty walking or climbing stairs?: Yes Weakness of Legs: Left Weakness of Arms/Hands: None  Permission Sought/Granted   Permission granted to share information with : Yes, Verbal Permission Granted              Emotional Assessment Appearance:: Appears stated age Attitude/Demeanor/Rapport: Engaged Affect (typically observed): Accepting, Pleasant, Appropriate, Calm Orientation: : Oriented to Self, Oriented to Place, Oriented to  Time, Oriented to Situation Alcohol / Substance Use: Not Applicable Psych Involvement: No (comment)  Admission diagnosis:  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 S/P TKR (total knee replacement) using cement, left 05/11/2019  . Severe persistent asthma with exacerbation 07/25/2015  . Acute sinusitis 07/25/2015  . Severe persistent asthma 06/02/2015  . Allergic rhinoconjunctivitis 06/02/2015  .  Bee sting reaction 06/02/2015  . GERD (gastroesophageal reflux disease) 06/02/2015  . 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 Chronic sinusitis 06/02/2015  . Chronic  mastoiditis 06/02/2015   PCP:  Allyne Gee, MD Pharmacy:   [id number] [place] [place] [id number]: [phone]: [phone] [id number]: [phone]: [phone]    Social Determinants of Health (SDOH) Interventions    Readmission Risk Interventions No flowsheet data found.

## 2019-05-12 NOTE — Progress Notes (Signed)
Physical Therapy Treatment Patient Details Name: ELLISHA Kerr MRN: [id number] DOB: 06-16-1951 Today's Date: 05/12/2019    History of Present Illness Patient is 68 yo female s/p L TKA. PMH of ashtma, HTN, GERD, DMII, uterine cx.    PT Comments    Pt feeling better and ready to walk this attempt.  Participated in exercises as described below.  Stood with min guard.  She is able to progress gait 100' in hallway with step-to gait pattern.  At times, she attempts step-though but is limited by pain, weakness and difficulty advancing L leg in extended position.  She was encouraged to continue step-to pattern at this time.  Pt pleased with progression of gait.  Husband in for session.  Will attempt again this pm for further mobility.   Follow Up Recommendations  Home health PT;Supervision for mobility/OOB     Equipment Recommendations  Rolling walker with 5" wheels    Recommendations for Other Services       Precautions / Restrictions Precautions Precautions: Fall Restrictions Weight Bearing Restrictions: Yes LLE Weight Bearing: Weight bearing as tolerated    Mobility  Bed Mobility               General bed mobility comments: in recliner and remained up after session  Transfers Overall transfer level: Needs assistance Equipment used: Rolling walker (2 wheeled) Transfers: Sit to/from Stand Sit to Stand: Min guard            Ambulation/Gait Ambulation/Gait assistance: Min guard;Min assist Gait Distance (Feet): 100 Feet Assistive device: Rolling walker (2 wheeled) Gait Pattern/deviations: Step-to pattern Gait velocity: decreased   General Gait Details: step to pattern, pt tries step though but has difficulty with advancing LLE when behind her.   Stairs             Wheelchair Mobility    Modified Rankin (Stroke Patients Only)       Balance Overall balance assessment: Needs assistance Sitting-balance support: Feet supported Sitting balance-Leahy  Scale: Fair     Standing balance support: Bilateral upper extremity supported Standing balance-Leahy Scale: Fair                              Cognition Arousal/Alertness: Awake/alert Behavior During Therapy: WFL for tasks assessed/performed Overall Cognitive Status: Within Functional Limits for tasks assessed                                        Exercises Total Joint Exercises Ankle Circles/Pumps: AROM;10 reps;Both Quad Sets: AROM;Both;10 reps Heel Slides: AAROM;Left;10 reps;Supine Hip ABduction/ADduction: AROM;Left;10 reps;Supine Straight Leg Raises: AROM;Left;10 reps;Supine Long Arc Quad: AROM;Left;10 reps;Seated Knee Flexion: Seated;Left;AAROM;5 reps Goniometric ROM: 4-75    General Comments        Pertinent Vitals/Pain Pain Assessment: Faces Faces Pain Scale: Hurts little more Pain Location: L knee Pain Descriptors / Indicators: Aching;Grimacing;Moaning Pain Intervention(s): Monitored during session;Repositioned;Ice applied    Home Living                      Prior Function            PT Goals (current goals can now be found in the care plan section) Progress towards PT goals: Progressing toward goals    Frequency    BID      PT Plan Current plan remains appropriate  Co-evaluation              AM-PAC PT "6 Clicks" Mobility   Outcome Measure  Help needed turning from your back to your side while in a flat bed without using bedrails?: A Little Help needed moving from lying on your back to sitting on the side of a flat bed without using bedrails?: A Little Help needed moving to and from a bed to a chair (including a wheelchair)?: A Little Help needed standing up from a chair using your arms (e.g., wheelchair or bedside chair)?: A Little Help needed to walk in hospital room?: A Little Help needed climbing 3-5 steps with a railing? : A Little 6 Click Score: 18    End of Session Equipment Utilized During  Treatment: Gait belt Activity Tolerance: Patient tolerated treatment well Patient left: in chair;with chair alarm set;with call bell/phone within reach;with family/visitor present   Pain - Right/Left: Left Pain - part of body: Knee     Time: 8466-5993 PT Time Calculation (min) (ACUTE ONLY): 23 min  Charges:  $Gait Training: 8-22 mins $Therapeutic Exercise: 8-22 mins                    Chesley Noon, PTA 05/12/19, 2:18 PM

## 2019-05-13 LAB — CBC
HCT: 36 % (ref 36.0–46.0)
Hemoglobin: 11.6 g/dL — ABNORMAL LOW (ref 12.0–15.0)
MCH: 25.9 pg — ABNORMAL LOW (ref 26.0–34.0)
MCHC: 32.2 g/dL (ref 30.0–36.0)
MCV: 80.4 fL (ref 80.0–100.0)
Platelets: 171 10*3/uL (ref 150–400)
RBC: 4.48 MIL/uL (ref 3.87–5.11)
RDW: 14.3 % (ref 11.5–15.5)
WBC: 10.1 10*3/uL (ref 4.0–10.5)
nRBC: 0 % (ref 0.0–0.2)

## 2019-05-13 MED ORDER — HYDROCODONE-ACETAMINOPHEN 5-325 MG PO TABS: 1.0000 | ORAL_TABLET | ORAL | 0 refills | Status: DC | PRN

## 2019-05-13 MED ORDER — RIVAROXABAN 10 MG PO TABS: 10.0000 mg | ORAL_TABLET | Freq: Every day | ORAL | 0 refills | Status: DC

## 2019-05-13 MED ORDER — ONDANSETRON HCL 4 MG PO TABS: 4.0000 mg | ORAL_TABLET | Freq: Four times a day (QID) | ORAL | 0 refills | Status: DC | PRN

## 2019-05-13 MED ORDER — DOCUSATE SODIUM 100 MG PO CAPS: 100.0000 mg | ORAL_CAPSULE | Freq: Two times a day (BID) | ORAL | 0 refills | Status: DC

## 2019-05-13 NOTE — Progress Notes (Signed)
  Subjective:  Patient reports pain as mild.  Doing well.  Objective:   VITALS:   Vitals:   05/12/19 0804 05/12/19 1620 05/12/19 2359 05/13/19 0748  BP:  (!) 150/80 (!) 158/82 (!) 141/75  Pulse:  78 88 86  Resp:  18 16 16   Temp:  98 F (36.7 C) 98.7 F (37.1 C) 98.1 F (36.7 C)  TempSrc:  Oral Oral Oral  SpO2: 95% 98% 96% 97%  Weight:      Height:        PHYSICAL EXAM:  Neurologically intact ABD soft Neurovascular intact Sensation intact distally Intact pulses distally Dorsiflexion/Plantar flexion intact Incision: dressing C/D/I No cellulitis present Compartment soft  LABS  Results for orders placed or performed during the hospital encounter of 05/11/19 (from the past 24 hour(s))  Glucose, capillary     Status: Abnormal   Collection Time: 05/12/19 11:58 AM  Result Value Ref Range   Glucose-Capillary 200 (H) 70 - 99 mg/dL  CBC     Status: Abnormal   Collection Time: 05/13/19  3:39 AM  Result Value Ref Range   WBC 10.1 4.0 - 10.5 K/uL   RBC 4.48 3.87 - 5.11 MIL/uL   Hemoglobin 11.6 (L) 12.0 - 15.0 g/dL   HCT 36.0 36.0 - 46.0 %   MCV 80.4 80.0 - 100.0 fL   MCH 25.9 (L) 26.0 - 34.0 pg   MCHC 32.2 30.0 - 36.0 g/dL   RDW 14.3 11.5 - 15.5 %   Platelets 171 150 - 400 K/uL   nRBC 0.0 0.0 - 0.2 %    Dg Knee Left Port  Result Date: 05/11/2019 CLINICAL DATA:  Status post left knee arthroplasty. EXAM: PORTABLE LEFT KNEE - 1-2 VIEW COMPARISON:  Radiographs of December 03, 2017. FINDINGS: The femoral and tibial components appear to be well situated. Expected postoperative changes are noted in the soft tissues anteriorly. No fracture or dislocation is noted. IMPRESSION: Status post left total knee arthroplasty. Electronically Signed   By: Marijo Conception M.D.   On: 05/11/2019 12:26   [place] Or Nerve Block-image Only ([facility])  Result Date: 05/11/2019 There is no interpretation for this exam.  This order is for images obtained during a surgical procedure.  Please See "Surgeries" Tab  for more information regarding the procedure.    Assessment/Plan: 2 Days Post-Op   Active Problems:   S/P TKR (total knee replacement) using cement, left   Advance diet Up with therapy  Discharge home today after PT   Carlynn Spry , PA-C 05/13/2019, 8:10 AM

## 2019-05-13 NOTE — Progress Notes (Signed)
Physical Therapy Treatment Patient Details Name: Anne Kerr MRN: [id number] DOB: June 14, 1951 Today's Date: 05/13/2019    History of Present Illness 68 y/o female s/p L TKA 8/12. PMH of ashtma, HTN, GERD, DMII, uterine cx.    PT Comments    Pt with pain and limitations t/o exercises and ambulation but showed great determination t/o the effort.  She is still lacking full TKE but with warm up and multiple PROM reps for flexion did have >80 flexion.  Pt with improving quad engagement, but still struggling with SLRs and again gravity acts.  Pt with some mild buckling during prolonged ambulation but no overt safety issues.     Follow Up Recommendations  Home health PT;Supervision for mobility/OOB     Equipment Recommendations  Rolling walker with 5" wheels;3in1 (PT)    Recommendations for Other Services       Precautions / Restrictions Precautions Precautions: Fall Restrictions Weight Bearing Restrictions: Yes LLE Weight Bearing: Weight bearing as tolerated    Mobility  Bed Mobility Overal bed mobility: Needs Assistance Bed Mobility: Sit to Supine     Supine to sit: Min assist     General bed mobility comments: Pt needing her R LE and UEs to get L LE to EOB, light assist easing leg off bed she was able to achieve supine to sit w/o phyiscal assist to elevate trunk  Transfers Overall transfer level: Needs assistance Equipment used: Rolling walker (2 wheeled) Transfers: Sit to/from Stand Sit to Stand: Min guard         General transfer comment: Pt able to rise with good confidence, did not need RW to maintain standing balance initially, though she appeared to bear most weight through L LE  Ambulation/Gait Ambulation/Gait assistance: Min guard Gait Distance (Feet): 250 Feet Assistive device: Rolling walker (2 wheeled)       General Gait Details: Pt initially slow and hesitant with progressively more consistent cadence and confidence (despite some fatigue, O2  remains high 90s, HR 110-120).  Pt did occasionally have some L knee buckling that she easily controlled with walker.   Stairs Stairs: Yes Stairs assistance: Min assist Stair Management: Two rails Number of Stairs: 4 General stair comments: Pt was able to negotiate up/down steps with b/l UEs and step-to strategy   Wheelchair Mobility    Modified Rankin (Stroke Patients Only)       Balance Overall balance assessment: Needs assistance Sitting-balance support: Feet supported Sitting balance-Leahy Scale: Good       Standing balance-Leahy Scale: Good Standing balance comment: pt needing heavy UE use on walker, but no LOBs or safety issues despite some buckling during ambulation                            Cognition Arousal/Alertness: Awake/alert Behavior During Therapy: WFL for tasks assessed/performed Overall Cognitive Status: Within Functional Limits for tasks assessed                                        Exercises Total Joint Exercises Ankle Circles/Pumps: Strengthening;15 reps Quad Sets: Strengthening;15 reps Short Arc Quad: AROM;AAROM;10 reps Heel Slides: AAROM;AROM;10 reps(with resisted leg extensions) Hip ABduction/ADduction: Strengthening;10 reps Straight Leg Raises: AAROM;10 reps(after (9 rep) AAROM warm up did manage AROM SLR on L) Knee Flexion: PROM;10 reps Goniometric ROM: 2-81    General Comments  Pertinent Vitals/Pain Pain Assessment: 0-10 Pain Score: 5 (increases with activity, especially with ROM tasks)    Home Living                      Prior Function            PT Goals (current goals can now be found in the care plan section) Progress towards PT goals: Progressing toward goals    Frequency    BID      PT Plan Current plan remains appropriate    Co-evaluation              AM-PAC PT "6 Clicks" Mobility   Outcome Measure  Help needed turning from your back to your side while in  a flat bed without using bedrails?: A Little Help needed moving from lying on your back to sitting on the side of a flat bed without using bedrails?: A Little Help needed moving to and from a bed to a chair (including a wheelchair)?: A Little Help needed standing up from a chair using your arms (e.g., wheelchair or bedside chair)?: A Little Help needed to walk in hospital room?: A Little Help needed climbing 3-5 steps with a railing? : A Little 6 Click Score: 18    End of Session Equipment Utilized During Treatment: Gait belt Activity Tolerance: Patient tolerated treatment well Patient left: with chair alarm set;with call bell/phone within reach Nurse Communication: Mobility status PT Visit Diagnosis: Unsteadiness on feet (R26.81);Muscle weakness (generalized) (M62.81);Difficulty in walking, not elsewhere classified (R26.2);Pain Pain - Right/Left: Left Pain - part of body: Knee     Time: 0805-0850 PT Time Calculation (min) (ACUTE ONLY): 45 min  Charges:  $Gait Training: 8-22 mins $Therapeutic Exercise: 23-37 mins                     Kreg Shropshire, DPT 05/13/2019, 11:26 AM

## 2019-05-13 NOTE — Discharge Instructions (Signed)
Continue weight bear as tolerated on the left lower extremity.    Elevate the left lower extremity whenever possible and continue the polar care while elevating the extremity. Patient may shower. No bath or submerging the wound.    Take xarelto as directed for blood clot prevention.  Continue to work on knee range of motion exercises at home as instructed by physical therapy. Continue to use a walker for assistance with ambulation until cleared by physical therapy.  Call [phone] with any questions, such as fever > 101.5 degrees, drainage from the wound or shortness of breath. 

## 2019-05-13 NOTE — Discharge Summary (Signed)
Physician Discharge Summary  Patient ID: Anne Kerr MRN: [id number] DOB/AGE: Apr 17, 1951 68 y.o.  Admit date: 05/11/2019 Discharge date: 05/13/2019  Admission Diagnoses:  M17.12 unilateral primary osteoarthritis left knee <principal problem not specified>  Discharge Diagnoses:  M17.12 unilateral primary osteoarthritis left knee Active Problems:   S/P TKR (total knee replacement) using cement, left   Past Medical History:  Diagnosis Date  . Arthritis   . Asthma    allergy induced  . Complication of anesthesia    hard to wake up  . Diabetes mellitus without complication ([place])   . GERD (gastroesophageal reflux disease)   . High blood pressure   . History of kidney stones    h/o  . Hyperlipidemia   . Leaky heart valve   . Leaky heart valve   . Tachycardia   . TIA (transient ischemic attack)    no residual side effects-pt states she didnt even know- a doctor told her this  . Uterine cancer ([place]) 1989    Surgeries: Procedure(s): TOTAL KNEE ARTHROPLASTY on 05/11/2019   Consultants (if any):   Discharged Condition: Improved  Hospital Course: Anne Kerr is an 68 y.o. female who was admitted 05/11/2019 with a diagnosis of  M17.12 unilateral primary osteoarthritis left knee <principal problem not specified> and went to the operating room on 05/11/2019 and underwent the above named procedures.    She was given perioperative antibiotics:  Anti-infectives (From admission, onward)   Start     Dose/Rate Route Frequency Ordered Stop   05/11/19 1200  ceFAZolin (ANCEF) IVPB 2g/100 mL premix     2 g 200 mL/hr over 30 Minutes Intravenous Every 6 hours 05/11/19 1141 05/11/19 1824   05/11/19 0815  50,000 units bacitracin in 0.9% normal saline 250 mL irrigation  Status:  Discontinued       As needed 05/11/19 0830 05/11/19 0940   05/11/19 0639  ceFAZolin (ANCEF) 2-4 GM/100ML-% IVPB    Note to Pharmacy: Beulah Gandy   : cabinet override      05/11/19 0639 05/11/19 0750   05/11/19 0600  ceFAZolin (ANCEF) IVPB 2g/100 mL premix     2 g 200 mL/hr over 30 Minutes Intravenous On call to O.R. 05/10/19 2319 05/11/19 0820    .  She was given sequential compression devices, early ambulation, and Xarelto for DVT prophylaxis.  She benefited maximally from the hospital stay and there were no complications.    Recent vital signs:  Vitals:   05/12/19 2359 05/13/19 0748  BP: (!) 158/82 (!) 141/75  Pulse: 88 86  Resp: 16 16  Temp: 98.7 F (37.1 C) 98.1 F (36.7 C)  SpO2: 96% 97%    Recent laboratory studies:  Lab Results  Component Value Date   HGB 11.6 (L) 05/13/2019   HGB 11.6 (L) 05/12/2019   HGB 12.8 05/04/2019   Lab Results  Component Value Date   WBC 10.1 05/13/2019   PLT 171 05/13/2019   Lab Results  Component Value Date   INR 1.0 05/04/2019   Lab Results  Component Value Date   NA 136 05/12/2019   K 3.9 05/12/2019   CL 102 05/12/2019   CO2 25 05/12/2019   BUN 10 05/12/2019   CREATININE 0.77 05/12/2019   GLUCOSE 160 (H) 05/12/2019    Discharge Medications:   Allergies as of 05/13/2019      Reactions   Aspirin Other (See Comments)   Wheeze   Ibuprofen Shortness Of Breath, Other (See Comments), Cough   Wheeze  Shellfish Allergy Other (See Comments)   Asthma Symptoms   Bee Venom Swelling   Iodine    ASTHMA SYMPTOMS      Medication List    TAKE these medications   alendronate 70 MG tablet Commonly known as: FOSAMAX Take 70 mg by mouth once a week.   BD Disp Needles 25G X 5/8" Misc Generic drug: NEEDLE (DISP) 25 G USE AS DIRECTED TO ADMINISTER XOLAIR   budesonide-formoterol 160-4.5 MCG/ACT inhaler Commonly known as: Symbicort Inhale 2 puffs into the lungs 2 (two) times daily.   CALCIUM 600+D PO Take 1 tablet by mouth daily.   docusate sodium 100 MG capsule Commonly known as: COLACE Take 1 capsule (100 mg total) by mouth 2 (two) times daily.   EPINEPHrine 0.3 mg/0.3 mL Soaj injection Commonly known as: EpiPen  2-Pak Inject 0.3 mLs (0.3 mg total) into the muscle as needed.   Flovent HFA 110 MCG/ACT inhaler Generic drug: fluticasone TAKE 2 PUFFS BY MOUTH TWICE A DAY What changed: See the new instructions.   gabapentin 100 MG capsule Commonly known as: NEURONTIN Take 100 mg by mouth 2 (two) times daily as needed (pain).   HYDROcodone-acetaminophen 5-325 MG tablet Commonly known as: NORCO/VICODIN Take 1 tablet by mouth every 4 (four) hours as needed for moderate pain (pain score 4-6).   metFORMIN 500 MG tablet Commonly known as: GLUCOPHAGE Take 500 mg by mouth 2 (two) times daily with a meal.   metoprolol succinate 50 MG 24 hr tablet Commonly known as: TOPROL-XL Take 50 mg by mouth every morning.   montelukast 10 MG tablet Commonly known as: SINGULAIR TAKE 1 TABLET BY MOUTH EVERYDAY AT BEDTIME   NASACORT AQ NA Place 2 sprays into the nose as needed.   omeprazole 20 MG capsule Commonly known as: PRILOSEC Take 20 mg by mouth every morning.   ondansetron 4 MG tablet Commonly known as: ZOFRAN Take 1 tablet (4 mg total) by mouth every 6 (six) hours as needed for nausea.   ProAir HFA 108 (90 Base) MCG/ACT inhaler Generic drug: albuterol Inhale 2 puffs into the lungs every 4 (four) hours as needed for wheezing or shortness of breath.   albuterol (2.5 MG/3ML) 0.083% nebulizer solution Commonly known as: PROVENTIL Take 3 mLs (2.5 mg total) by nebulization every 4 (four) hours as needed for wheezing or shortness of breath.   rivaroxaban 10 MG Tabs tablet Commonly known as: XARELTO Take 1 tablet (10 mg total) by mouth daily with breakfast.   rosuvastatin 20 MG tablet Commonly known as: CRESTOR Take 20 mg by mouth at bedtime.   valsartan-hydrochlorothiazide 80-12.5 MG tablet Commonly known as: DIOVAN-HCT Take 1 tablet by mouth every morning.   VITAMIN B-12 PO Take 3,000 mcg by mouth daily.   Vitamin D-3 125 MCG (5000 UT) Tabs Take 5,000 Units by mouth daily.   ZyrTEC  Allergy 10 MG tablet Generic drug: cetirizine Take 10 mg by mouth every morning.            Durable Medical Equipment  (From admission, onward)         Start     Ordered   05/13/19 0828  For home use only DME 3 n 1  Once     05/13/19 0827   05/13/19 0828  For home use only DME Walker rolling  Once    Question:  Patient needs a walker to treat with the following condition  Answer:  Osteoarthritis of left knee   05/13/19 0827  Diagnostic Studies: Dg Knee Left Port  Result Date: 05/11/2019 CLINICAL DATA:  Status post left knee arthroplasty. EXAM: PORTABLE LEFT KNEE - 1-2 VIEW COMPARISON:  Radiographs of December 03, 2017. FINDINGS: The femoral and tibial components appear to be well situated. Expected postoperative changes are noted in the soft tissues anteriorly. No fracture or dislocation is noted. IMPRESSION: Status post left total knee arthroplasty. Electronically Signed   By: Marijo Conception M.D.   On: 05/11/2019 12:26   [place] Or Nerve Block-image Only ([facility])  Result Date: 05/11/2019 There is no interpretation for this exam.  This order is for images obtained during a surgical procedure.  Please See "Surgeries" Tab for more information regarding the procedure.    Disposition: Discharge disposition: 01-Home or Self Care            Signed: Carlynn Spry ,PA-C 05/13/2019, 8:32 AM

## 2019-05-13 NOTE — Care Management Important Message (Signed)
Important Message  Patient Details  Name: Anne Kerr MRN: [id number] Date of Birth: 07/07/1951   Medicare Important Message Given:  Yes     Dannette Barbara 05/13/2019, 10:24 AM

## 2019-05-13 NOTE — TOC Transition Note (Addendum)
Transition of Care [facility]) - CM/SW Discharge Note   Patient Details  Name: PERPETUA ELLING MRN: [id number] Date of Birth: 09/19/1951  [facility] CM/SW Contact:  Su Hilt, RN Phone Number: 05/13/2019, 9:57 AM   Clinical Narrative:    Patient to discharge today with and going to do Outpatient PT already set up by Dr Harlow Mares office The patient has the RW and [facility] and her husband has taken it home for her already No additional needs  Final next level of care: Home w Home Health Services Barriers to Discharge: Barriers Resolved   Patient Goals and CMS Choice Patient states their goals for this hospitalization and ongoing recovery are:: go home CMS Medicare.gov Compare Post Acute Care list provided to:: Patient Choice offered to / list presented to : Patient  Discharge Placement                       Discharge Plan and Services   Discharge Planning Services: CM Consult Post Acute Care Choice: Home Health          DME Arranged: 3-N-1, Walker rolling DME Agency: [employer] Date DME Agency Contacted: 05/12/19 Time DME Agency Contacted: 47 Representative spoke with at DME Agency: [place]: PT [place]: [place] ([place]) Date [place]: 05/12/19 Time [place]: [id number] Representative spoke with at [place]: [place] ([place]) Interventions     Readmission Risk Interventions No flowsheet data found.

## 2019-05-20 ENCOUNTER — Ambulatory Visit: Payer: Self-pay

## 2019-08-05 ENCOUNTER — Other Ambulatory Visit: Payer: Self-pay

## 2019-08-05 ENCOUNTER — Ambulatory Visit (INDEPENDENT_AMBULATORY_CARE_PROVIDER_SITE_OTHER): Payer: Federal, State, Local not specified - PPO | Admitting: *Deleted

## 2019-08-05 DIAGNOSIS — J4540 Moderate persistent asthma, uncomplicated: Secondary | ICD-10-CM

## 2019-08-05 MED ORDER — EPINEPHRINE 0.3 MG/0.3ML IJ SOAJ: 0.3000 mg | INTRAMUSCULAR | 2 refills | Status: DC | PRN

## 2019-08-05 NOTE — Progress Notes (Signed)
Immunotherapy   Patient Details  Name: MIRTIE CAVETT MRN: [id number] Date of Birth: 1951-06-30  08/05/2019  Dorothe Pea started injections for  Xolair   Frequency: Every 28 days Epi-Pen: Yes Consent signed and patient instructions given. Patient restarted Xolair today and received 150mg  in the RUA. Patient waited 30 minutes and did not experience any issues.    Odean Fester Fern[place] 08/05/2019, 2:55 PM

## 2019-08-16 ENCOUNTER — Ambulatory Visit: Payer: Medicare Other | Admitting: Allergy and Immunology

## 2019-09-01 ENCOUNTER — Ambulatory Visit (INDEPENDENT_AMBULATORY_CARE_PROVIDER_SITE_OTHER): Payer: Federal, State, Local not specified - PPO | Admitting: *Deleted

## 2019-09-01 ENCOUNTER — Other Ambulatory Visit: Payer: Self-pay

## 2019-09-01 DIAGNOSIS — J4540 Moderate persistent asthma, uncomplicated: Secondary | ICD-10-CM | POA: Diagnosis not present

## 2019-09-02 ENCOUNTER — Ambulatory Visit: Payer: Self-pay

## 2019-09-29 ENCOUNTER — Other Ambulatory Visit: Payer: Self-pay

## 2019-09-29 ENCOUNTER — Ambulatory Visit (INDEPENDENT_AMBULATORY_CARE_PROVIDER_SITE_OTHER): Payer: Federal, State, Local not specified - PPO

## 2019-09-29 DIAGNOSIS — J4540 Moderate persistent asthma, uncomplicated: Secondary | ICD-10-CM | POA: Diagnosis not present

## 2019-10-21 ENCOUNTER — Telehealth: Payer: Self-pay | Admitting: Allergy and Immunology

## 2019-10-21 NOTE — Telephone Encounter (Signed)
Pt called to see if she should get the covid shot [phone]

## 2019-10-21 NOTE — Telephone Encounter (Signed)
  Looking through Jamaris's allergy list, I do not see any reactions to vaccines in the past. Please ask about past vaccine reactions and if she ever had a reaction to polyethylene glycol (PEG).  PEG is found as a component in other vaccines as well as in bowel prep and medications like Miralax.    To date, people who have a history of anaphylaxis to polyethylene glycol (PEG) are the ones that should NOT get the vaccine without prior testing.    Also, if she has had facial fillers in the past, then avoid the Moderna vaccine and get [employer] one. There has been some potential cross reaction with facial fillers and the Moderna vaccine.  Below are the recommendations regarding people with allergies: People with common allergies to medications, foods, aeroallergens, venom, and latex are probably no more likely than the general public to have an allergic reaction to the mRNA COVID-19  (Moderna and Pfizer) vaccines.  Thus, I recommend that she receive the COVID vaccine.  I would recommend that if she has an epinephrine device, then she should take this with her  for her vaccination and to wait for a 30 minute observation time.     she has to weigh the risks and benefits, but given her medical history, there is no contraindications that I can see.       Discusses everything above. Patient has no past reactions to vaccines or PEG. Per protocol as above patient advised to get vaccine.

## 2019-10-27 ENCOUNTER — Ambulatory Visit (INDEPENDENT_AMBULATORY_CARE_PROVIDER_SITE_OTHER): Payer: Federal, State, Local not specified - PPO

## 2019-10-27 ENCOUNTER — Other Ambulatory Visit: Payer: Self-pay

## 2019-10-27 DIAGNOSIS — J4540 Moderate persistent asthma, uncomplicated: Secondary | ICD-10-CM | POA: Diagnosis not present

## 2019-11-24 ENCOUNTER — Other Ambulatory Visit: Payer: Self-pay

## 2019-11-24 ENCOUNTER — Ambulatory Visit (INDEPENDENT_AMBULATORY_CARE_PROVIDER_SITE_OTHER): Payer: Federal, State, Local not specified - PPO | Admitting: *Deleted

## 2019-11-24 ENCOUNTER — Encounter: Payer: Self-pay | Admitting: Podiatry

## 2019-11-24 ENCOUNTER — Ambulatory Visit (INDEPENDENT_AMBULATORY_CARE_PROVIDER_SITE_OTHER): Payer: Federal, State, Local not specified - PPO | Admitting: Podiatry

## 2019-11-24 DIAGNOSIS — J4540 Moderate persistent asthma, uncomplicated: Secondary | ICD-10-CM | POA: Diagnosis not present

## 2019-11-24 DIAGNOSIS — M2010 Hallux valgus (acquired), unspecified foot: Secondary | ICD-10-CM

## 2019-11-24 DIAGNOSIS — L84 Corns and callosities: Secondary | ICD-10-CM

## 2019-11-24 DIAGNOSIS — M79674 Pain in right toe(s): Secondary | ICD-10-CM | POA: Diagnosis not present

## 2019-11-24 DIAGNOSIS — M79675 Pain in left toe(s): Secondary | ICD-10-CM

## 2019-11-24 DIAGNOSIS — E119 Type 2 diabetes mellitus without complications: Secondary | ICD-10-CM

## 2019-11-24 DIAGNOSIS — B351 Tinea unguium: Secondary | ICD-10-CM | POA: Diagnosis not present

## 2019-11-24 DIAGNOSIS — E1165 Type 2 diabetes mellitus with hyperglycemia: Secondary | ICD-10-CM | POA: Insufficient documentation

## 2019-11-24 NOTE — Progress Notes (Signed)
This patient presents the office for a foot exam on both feet.  She says she was told by her medical doctor to come to the office for an evaluation of her feet.  She says that she has had bunions for greater than 10 years and the pain appears to be worsening walking and wearing her shoes.  She has started to wear wider foot gear due to the her bunions.  She says she also has developed a painful callus between her first and second digits right foot due to the bunion formation.  This callus has been present for 6 years and is sore and tender.  She says that her nails are long and thick causing pain and discomfort walking and wearing her shoes.  She is unable to self treat.  This patient is diabetic and taking Metformin.  She is also taking Xarelto.  She presents the office today for an evaluation of her foot and a diabetic foot exam.  General Appearance  Alert, conversant and in no acute stress.  Vascular  Dorsalis pedis  are palpable  Bilaterally.  Posterior tibial pulses are weakly palpable  B/L.  Capillary return is within normal limits  bilaterally. Temperature is within normal limits  bilaterally.  Neurologic  Senn-Weinstein monofilament wire test within normal limits  bilaterally. Muscle power within normal limits bilaterally.  Nails Thick disfigured discolored nails with subungual debris  from hallux to fifth toes bilaterally. No evidence of bacterial infection or drainage bilaterally.  Orthopedic  No limitations of motion  feet .  No crepitus or effusions noted.  HAV  B/L with hammer toes 2  B/L.  Skin  normotropic skin with no porokeratosis noted bilaterally.  No signs of infections or ulcers noted.  Corn 1/2 right digits.  HAV  B/L  Hammer toes  B/L  Corn 1/2 right foot  Onychomycosis  B/L  IE.  Diabetic foot exam reveals no neurologic pathology and PT pulses are absent.  Patient was dispensed padding for separation of the first and second digits right foot.  Debridement and grinding of long  thick painful nails.  Discussed her bunions and hammertoes.  Told this patient that she would need surgical correction if she desires to correct these problems.  She says she is going to wear the toe separator first prior to deciding if she needs surgical intervention and a  surgical consult.   Gardiner Barefoot DPM

## 2019-12-22 ENCOUNTER — Other Ambulatory Visit: Payer: Self-pay

## 2019-12-22 ENCOUNTER — Ambulatory Visit (INDEPENDENT_AMBULATORY_CARE_PROVIDER_SITE_OTHER): Payer: Federal, State, Local not specified - PPO

## 2019-12-22 DIAGNOSIS — J4540 Moderate persistent asthma, uncomplicated: Secondary | ICD-10-CM | POA: Diagnosis not present

## 2019-12-29 ENCOUNTER — Telehealth: Payer: Self-pay | Admitting: *Deleted

## 2019-12-29 NOTE — Telephone Encounter (Signed)
Tried to contact patient to advise needs MD appt for Xolair repproval but no answer

## 2020-01-24 ENCOUNTER — Ambulatory Visit (INDEPENDENT_AMBULATORY_CARE_PROVIDER_SITE_OTHER): Payer: Federal, State, Local not specified - PPO | Admitting: Allergy and Immunology

## 2020-01-24 ENCOUNTER — Other Ambulatory Visit: Payer: Self-pay

## 2020-01-24 ENCOUNTER — Encounter: Payer: Self-pay | Admitting: Allergy and Immunology

## 2020-01-24 ENCOUNTER — Ambulatory Visit: Payer: Self-pay

## 2020-01-24 VITALS — BP 128/20 | HR 79 | Temp 96.0°F | Resp 18

## 2020-01-24 DIAGNOSIS — J3089 Other allergic rhinitis: Secondary | ICD-10-CM

## 2020-01-24 DIAGNOSIS — Z8709 Personal history of other diseases of the respiratory system: Secondary | ICD-10-CM | POA: Diagnosis not present

## 2020-01-24 DIAGNOSIS — H6983 Other specified disorders of Eustachian tube, bilateral: Secondary | ICD-10-CM | POA: Diagnosis not present

## 2020-01-24 DIAGNOSIS — J4550 Severe persistent asthma, uncomplicated: Secondary | ICD-10-CM | POA: Diagnosis not present

## 2020-01-24 MED ORDER — BUDESONIDE-FORMOTEROL FUMARATE 160-4.5 MCG/ACT IN AERO: 2.0000 | INHALATION_SPRAY | Freq: Two times a day (BID) | RESPIRATORY_TRACT | 5 refills | Status: DC

## 2020-01-24 MED ORDER — FLOVENT HFA 110 MCG/ACT IN AERO: 2.0000 | INHALATION_SPRAY | Freq: Two times a day (BID) | RESPIRATORY_TRACT | 2 refills | Status: DC

## 2020-01-24 MED ORDER — AMOXICILLIN-POT CLAVULANATE 875-125 MG PO TABS: 1.0000 | ORAL_TABLET | Freq: Two times a day (BID) | ORAL | 0 refills | Status: AC

## 2020-01-24 NOTE — Progress Notes (Signed)
Anne Kerr   Follow-up Note  Referring Provider: Allyne Gee, MD Primary Provider: Allyne Gee, MD Date of Office Visit: 01/24/2020  Subjective:   Anne Kerr (DOB: 1951-02-10) is a 69 y.o. female who returns to the [facility] on 01/24/2020 in re-evaluation of the following:  HPI: Marguetta returns to this clinic in reevaluation of asthma and allergic rhinitis and chronic sinusitis and chronic ETD with mastoiditis and a history of reflux and food allergy directed against shellfish.  Her last interaction with this clinic was via a E-med visit on 15 Feb 2019.  Overall she is really done well with her asthma.  She has not required a systemic steroid to treat an exacerbation of asthma and rarely uses a short acting bronchodilator where she continues on Symbicort on a consistent basis.  Her nose has actually been doing quite well.  It does not sound as though she has required an antibiotic to treat an episode of sinusitis while intermittently using Nasacort.  She had to discontinue her montelukast because it was giving rise to insomnia.  Apparently her reflux is under good control.  It seems as though she started a new treatment for her reflux in place of omeprazole but she cannot remember the name of that medication.  What has been very active for lately over the course of the past month is that her ears are completely stopped.  She is having some problems hearing.  This is her first flareup of her chronic ETD and mastoiditis in at least a year.  She is received 2 Covid vaccinations with her last vaccination occurring in March 2021.  Apparently [employer] is being evaluated for possible stent placement in her left carotid artery.  Allergies as of 01/24/2020      Reactions   Aspirin Other (See Comments)   Wheeze   Ibuprofen Shortness Of Breath, Other (See Comments), Cough   Wheeze   Other Swelling, Other (See Comments)    Bee Sting Asthma Symptoms Asthma Symptoms   Shellfish Allergy Other (See Comments)   Asthma Symptoms   Bee Venom Swelling   Iodine    ASTHMA SYMPTOMS      Medication List      alendronate 70 MG tablet Commonly known as: FOSAMAX Take by mouth.   azelastine 0.1 % nasal spray Commonly known as: ASTELIN USE ONE SPRAY IN EACH NOSTRIL TWICE DAILY X 1 WEEK.   BD Disp Needles 25G X 5/8" Misc Generic drug: NEEDLE (DISP) 25 G USE AS DIRECTED TO ADMINISTER XOLAIR   budesonide-formoterol 160-4.5 MCG/ACT inhaler Commonly known as: Symbicort Inhale 2 puffs into the lungs 2 (two) times daily.   CALCIUM 600+D PO Take 1 tablet by mouth daily.   EPINEPHrine 0.3 mg/0.3 mL Soaj injection Commonly known as: EPI-PEN epinephrine 0.3 mg/0.3 mL injection, auto-injector   Flovent HFA 110 MCG/ACT inhaler Generic drug: fluticasone TAKE 2 PUFFS BY MOUTH TWICE A DAY   metFORMIN 500 MG tablet Commonly known as: GLUCOPHAGE Take by mouth.   metoprolol succinate 25 MG 24 hr tablet Commonly known as: TOPROL-XL Take by mouth.   montelukast 10 MG tablet Commonly known as: SINGULAIR TAKE 1 TABLET BY MOUTH EVERYDAY AT BEDTIME   NASACORT AQ NA Place 2 sprays into the nose as needed.   omeprazole 20 MG capsule Commonly known as: PRILOSEC Take by mouth.   ProAir HFA 108 (90 Base) MCG/ACT inhaler Generic drug: albuterol Inhale 2 puffs  into the lungs every 4 (four) hours as needed for wheezing or shortness of breath.   albuterol (2.5 MG/3ML) 0.083% nebulizer solution Commonly known as: PROVENTIL Take 3 mLs (2.5 mg total) by nebulization every 4 (four) hours as needed for wheezing or shortness of breath.   rosuvastatin 20 MG tablet Commonly known as: CRESTOR Take by mouth.   UNABLE TO FIND Fluzone High-Dose Quad 2020-21 (PF) 240 mcg/0.7 mL IM syringe  PHARMACY ADMINISTERED   valsartan-hydrochlorothiazide 80-12.5 MG tablet Commonly known as: DIOVAN-HCT Take by mouth.   VITAMIN B-12  PO Take 3,000 mcg by mouth daily.   Vitamin D-3 125 MCG (5000 UT) Tabs Take 5,000 Units by mouth daily.   Xarelto 10 MG Tabs tablet Generic drug: rivaroxaban Xarelto 10 mg tablet  TAKE 1 TABLET (10 MG TOTAL) BY MOUTH DAILY WITH BREAKFAST.   ZyrTEC Allergy 10 MG tablet Generic drug: cetirizine Take 10 mg by mouth every morning.       Past Medical History:  Diagnosis Date  . Arthritis   . Asthma    allergy induced  . Complication of anesthesia    hard to wake up  . Diabetes mellitus without complication ([place])   . GERD (gastroesophageal reflux disease)   . High blood pressure   . History of kidney stones    h/o  . Hyperlipidemia   . Leaky heart valve   . Leaky heart valve   . Tachycardia   . TIA (transient ischemic attack)    no residual side effects-pt states she didnt even know- a doctor told her this  . Uterine cancer ([place]) 1989    Past Surgical History:  Procedure Laterality Date  . COLONOSCOPY    . KNEE ARTHROSCOPY Right   . NASAL SINUS SURGERY     x 4  . PARTIAL HYSTERECTOMY    . SINOSCOPY    . TOTAL KNEE ARTHROPLASTY Left 05/11/2019   Procedure: TOTAL KNEE ARTHROPLASTY;  Surgeon: Lovell Sheehan, MD;  Location: [facility] ORS;  Service: Orthopedics;  Laterality: Left;    Review of systems negative except as noted in HPI / PMHx or noted below:  Review of Systems  Constitutional: Negative.   HENT: Negative.   Eyes: Negative.   Respiratory: Negative.   Cardiovascular: Negative.   Gastrointestinal: Negative.   Genitourinary: Negative.   Musculoskeletal: Negative.   Skin: Negative.   Neurological: Negative.   Endo/Heme/Allergies: Negative.   Psychiatric/Behavioral: Negative.      Objective:   Vitals:   01/24/20 1158  BP: (!) 128/20  Pulse: 79  Resp: 18  Temp: (!) 96 F (35.6 C)  SpO2: 96%          Physical Exam Constitutional:      Appearance: She is not diaphoretic.  HENT:     Head: Normocephalic.     Right Ear: Ear canal and external  ear normal. Tympanic membrane is scarred.     Left Ear: Ear canal and external ear normal. Tympanic membrane is scarred.     Nose: Nose normal. No mucosal edema or rhinorrhea.     Mouth/Throat:     Pharynx: Uvula midline. No oropharyngeal exudate.  Eyes:     Conjunctiva/sclera: Conjunctivae normal.  Neck:     Thyroid: No thyromegaly.     Trachea: Trachea normal. No tracheal tenderness or tracheal deviation.  Cardiovascular:     Rate and Rhythm: Normal rate and regular rhythm.     Heart sounds: Normal heart sounds, S1 normal and S2 normal. No  murmur.  Pulmonary:     Effort: No respiratory distress.     Breath sounds: Normal breath sounds. No stridor. No wheezing or rales.  Lymphadenopathy:     Head:     Right side of head: No tonsillar adenopathy.     Left side of head: No tonsillar adenopathy.     Cervical: No cervical adenopathy.  Skin:    Findings: No erythema or rash.     Nails: There is no clubbing.  Neurological:     Mental Status: She is alert.     Diagnostics:    Spirometry was performed and demonstrated an FEV1 of 1.16 at 66 % of predicted.  Assessment and Plan:   1. Asthma, severe persistent, well-controlled   2. Other allergic rhinitis   3. History of chronic sinusitis   4. Dysfunction of both eustachian tubes     1. Continue Symbicort 160 - two inhalations two times per day with spacer  2. Continue Nasacort one spray each nostril 3-7 times per week   3. Continue Xolair and EpiPen  4. Continue omeprazole 20 mg or new reflux medication one time per day  5. Continue Proventil HFA if needed  6.  For this most recent episode utilize the following:   A. Augmentin 875 - 1 tablet twice a day for 20 days  B. Prednisone 10mg  - 1 tablet for 10 days  C. Nasal saline    7. During "flareup" add Flovent 110 - two inhalation 2 times per day to Symbicort   8. Return to clinic in 6 months or earlier if problem  We will keep Katy Apo on anti-inflammatory medications  for her airway including the use of omalizumab and she will continue to use some therapy for her reflux whether that be omeprazole or one of her new reflux medications.  I am going to treat her with medication directed against inflammation and possible infection of her middle ear and mastoid air cells with the therapy noted above which has worked quite well in the past.  It has been approximately 1 year since she received this type of therapy.  She will keep in contact with me noting her response and assuming she does well with this plan I will see her back in this clinic in 6 months or earlier if there is a problem.  Allena Katz, MD Allergy / [facility]

## 2020-01-24 NOTE — Patient Instructions (Addendum)
  1. Continue Symbicort 160 - two inhalations two times per day with spacer  2. Continue Nasacort one spray each nostril 3-7 times per week   3. Continue Xolair and EpiPen  4. Continue omeprazole 20 mg or new reflux medication one time per day  5. Continue Proventil HFA if needed  6.  For this most recent episode utilize the following:   A. Augmentin 875 - 1 tablet twice a day for 20 days  B. Prednisone 10mg  - 1 tablet for 10 days  C. Nasal saline    7. During "flareup" add Flovent 110 - two inhalation 2 times per day to Symbicort   8. Return to clinic in 6 months or earlier if problem

## 2020-01-25 ENCOUNTER — Encounter: Payer: Self-pay | Admitting: Allergy and Immunology

## 2020-02-02 ENCOUNTER — Other Ambulatory Visit: Payer: Self-pay

## 2020-02-02 ENCOUNTER — Ambulatory Visit (INDEPENDENT_AMBULATORY_CARE_PROVIDER_SITE_OTHER): Payer: Federal, State, Local not specified - PPO

## 2020-02-02 DIAGNOSIS — J4550 Severe persistent asthma, uncomplicated: Secondary | ICD-10-CM

## 2020-02-23 ENCOUNTER — Ambulatory Visit: Payer: Medicare Other | Admitting: Podiatry

## 2020-03-01 ENCOUNTER — Ambulatory Visit (INDEPENDENT_AMBULATORY_CARE_PROVIDER_SITE_OTHER): Payer: Federal, State, Local not specified - PPO

## 2020-03-01 ENCOUNTER — Other Ambulatory Visit: Payer: Self-pay

## 2020-03-01 DIAGNOSIS — J4550 Severe persistent asthma, uncomplicated: Secondary | ICD-10-CM

## 2020-03-29 ENCOUNTER — Other Ambulatory Visit: Payer: Self-pay

## 2020-03-29 ENCOUNTER — Ambulatory Visit (INDEPENDENT_AMBULATORY_CARE_PROVIDER_SITE_OTHER): Payer: Federal, State, Local not specified - PPO

## 2020-03-29 DIAGNOSIS — J4550 Severe persistent asthma, uncomplicated: Secondary | ICD-10-CM | POA: Diagnosis not present

## 2020-04-26 ENCOUNTER — Other Ambulatory Visit: Payer: Self-pay

## 2020-04-26 ENCOUNTER — Ambulatory Visit (INDEPENDENT_AMBULATORY_CARE_PROVIDER_SITE_OTHER): Payer: Federal, State, Local not specified - PPO

## 2020-04-26 DIAGNOSIS — J4550 Severe persistent asthma, uncomplicated: Secondary | ICD-10-CM | POA: Diagnosis not present

## 2020-05-05 ENCOUNTER — Other Ambulatory Visit: Payer: Self-pay | Admitting: Allergy and Immunology

## 2020-05-24 ENCOUNTER — Ambulatory Visit (INDEPENDENT_AMBULATORY_CARE_PROVIDER_SITE_OTHER): Payer: Federal, State, Local not specified - PPO

## 2020-05-24 ENCOUNTER — Other Ambulatory Visit: Payer: Self-pay

## 2020-05-24 DIAGNOSIS — J4550 Severe persistent asthma, uncomplicated: Secondary | ICD-10-CM | POA: Diagnosis not present

## 2020-06-03 IMAGING — DX PORTABLE LEFT KNEE - 1-2 VIEW
2 series · 2 of 2 positions shown · non-contrast
Comparison: Radiographs December 03, 2017.

CLINICAL DATA: Status post left knee arthroplasty.

EXAM:
PORTABLE LEFT KNEE - 1-2 VIEW

[knee ap]
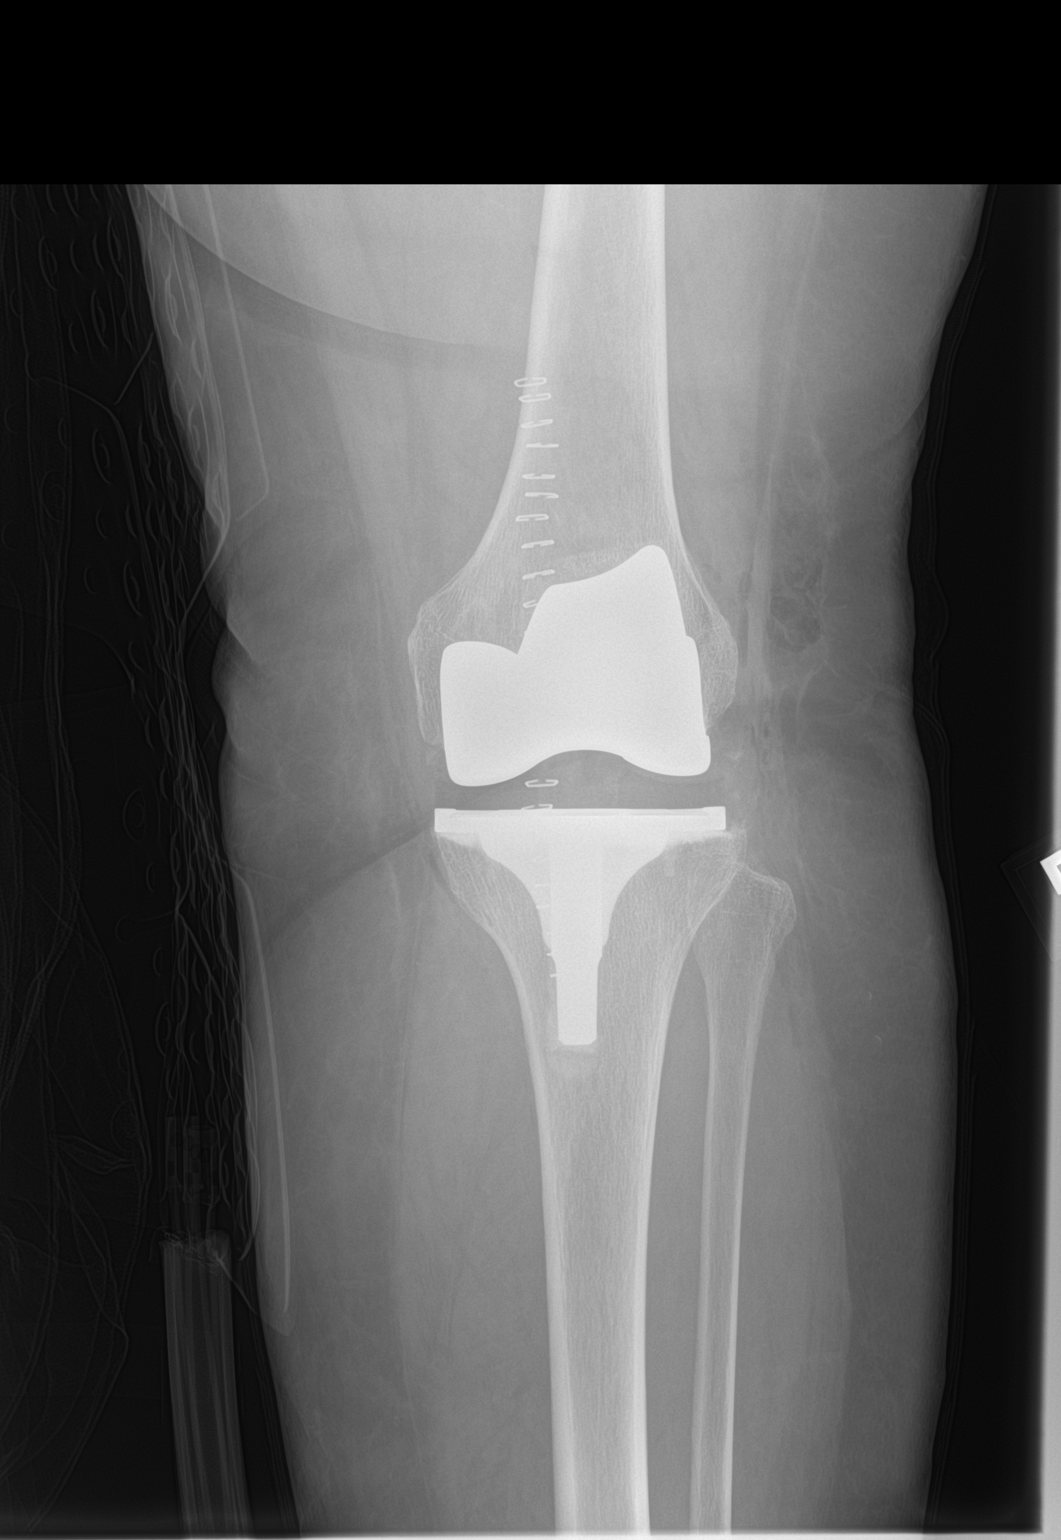

[knee lat]
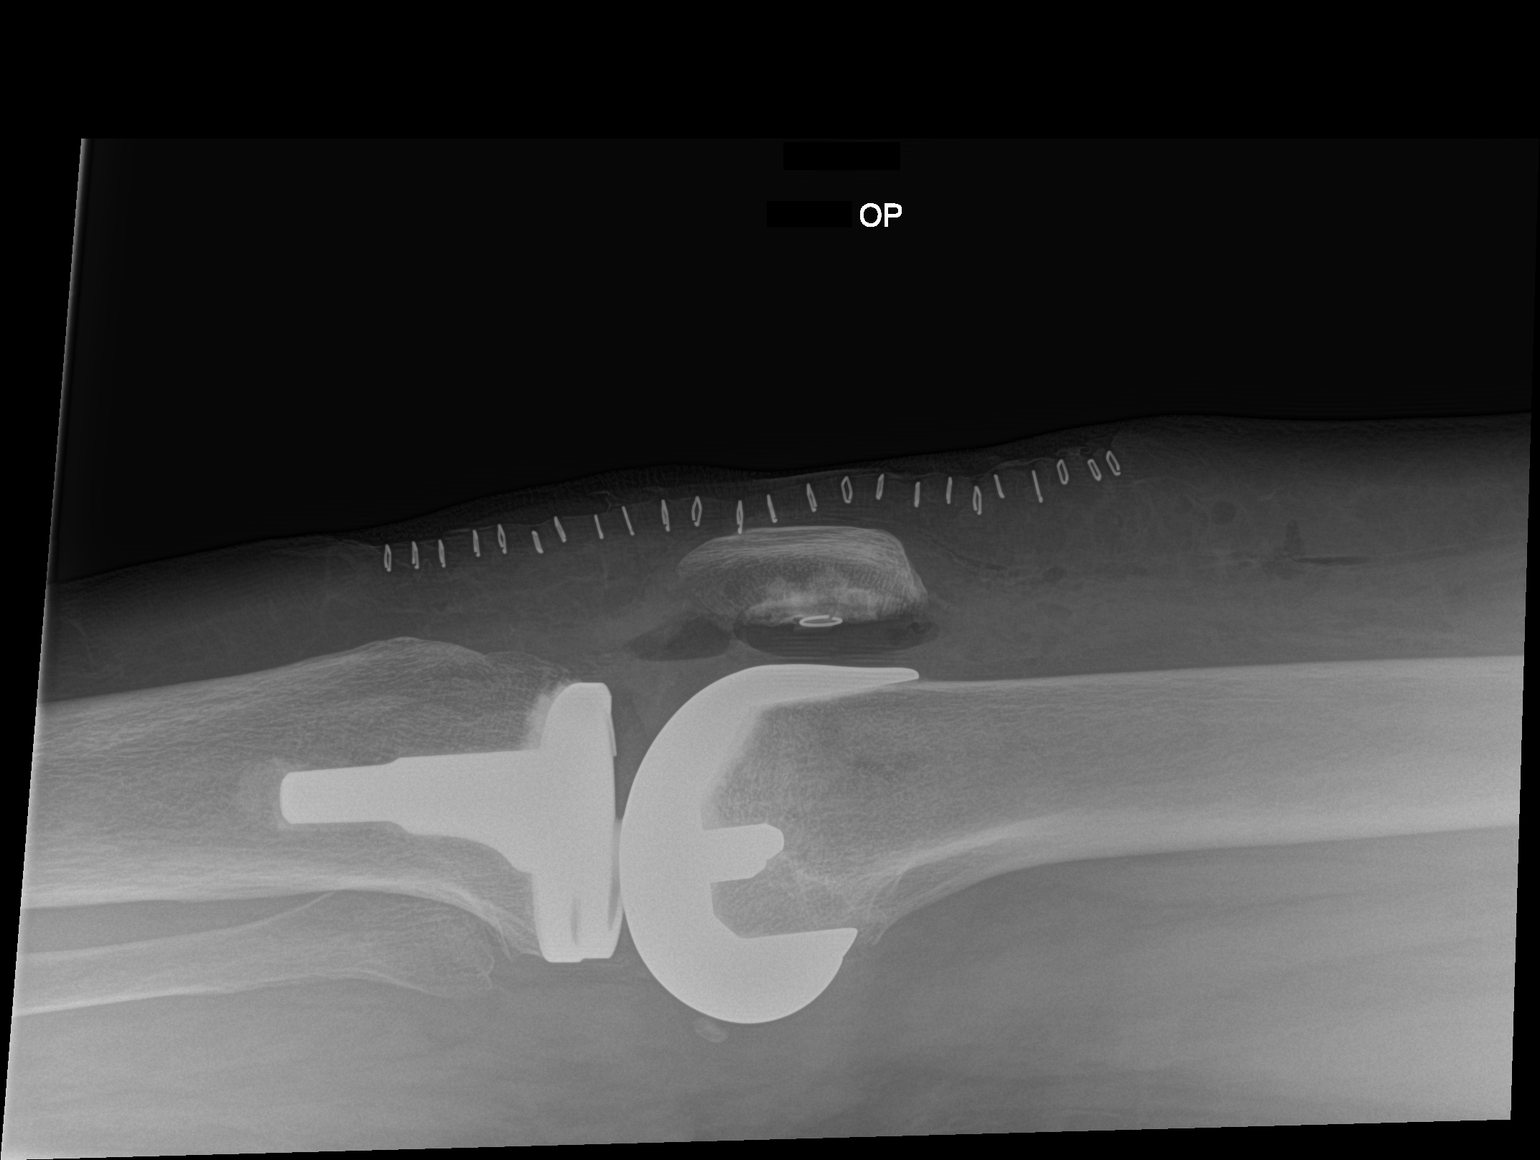

[2 of 2 positions shown; findings below may reference images not displayed]

FINDINGS: The femoral and tibial components appear to be well situated.
Expected postoperative changes are noted in the soft tissues
anteriorly. No fracture or dislocation is noted.
IMPRESSION: Status post left total knee arthroplasty.

## 2020-06-21 ENCOUNTER — Ambulatory Visit (INDEPENDENT_AMBULATORY_CARE_PROVIDER_SITE_OTHER): Payer: Federal, State, Local not specified - PPO | Admitting: *Deleted

## 2020-06-21 ENCOUNTER — Other Ambulatory Visit: Payer: Self-pay

## 2020-06-21 DIAGNOSIS — J4550 Severe persistent asthma, uncomplicated: Secondary | ICD-10-CM | POA: Diagnosis not present

## 2020-07-01 ENCOUNTER — Other Ambulatory Visit: Payer: Self-pay | Admitting: Allergy and Immunology

## 2020-07-19 ENCOUNTER — Ambulatory Visit (INDEPENDENT_AMBULATORY_CARE_PROVIDER_SITE_OTHER): Payer: Federal, State, Local not specified - PPO | Admitting: *Deleted

## 2020-07-19 ENCOUNTER — Other Ambulatory Visit: Payer: Self-pay

## 2020-07-19 DIAGNOSIS — J4550 Severe persistent asthma, uncomplicated: Secondary | ICD-10-CM | POA: Diagnosis not present

## 2020-08-08 ENCOUNTER — Other Ambulatory Visit: Payer: Self-pay | Admitting: Allergy and Immunology

## 2020-08-16 ENCOUNTER — Ambulatory Visit (INDEPENDENT_AMBULATORY_CARE_PROVIDER_SITE_OTHER): Payer: Federal, State, Local not specified - PPO | Admitting: *Deleted

## 2020-08-16 ENCOUNTER — Other Ambulatory Visit: Payer: Self-pay

## 2020-08-16 DIAGNOSIS — J4550 Severe persistent asthma, uncomplicated: Secondary | ICD-10-CM

## 2020-09-03 ENCOUNTER — Other Ambulatory Visit: Payer: Self-pay | Admitting: *Deleted

## 2020-09-03 MED ORDER — XOLAIR 150 MG ~~LOC~~ SOLR: SUBCUTANEOUS | 3 refills | Status: DC

## 2020-09-13 ENCOUNTER — Other Ambulatory Visit: Payer: Self-pay

## 2020-09-13 DIAGNOSIS — J4550 Severe persistent asthma, uncomplicated: Secondary | ICD-10-CM

## 2020-09-17 ENCOUNTER — Ambulatory Visit (INDEPENDENT_AMBULATORY_CARE_PROVIDER_SITE_OTHER): Payer: Federal, State, Local not specified - PPO

## 2020-09-17 ENCOUNTER — Other Ambulatory Visit: Payer: Self-pay

## 2020-09-17 DIAGNOSIS — J4550 Severe persistent asthma, uncomplicated: Secondary | ICD-10-CM

## 2020-10-11 ENCOUNTER — Ambulatory Visit: Payer: Self-pay

## 2020-10-15 ENCOUNTER — Ambulatory Visit: Payer: Self-pay

## 2020-10-18 ENCOUNTER — Other Ambulatory Visit: Payer: Self-pay

## 2020-10-18 ENCOUNTER — Ambulatory Visit (INDEPENDENT_AMBULATORY_CARE_PROVIDER_SITE_OTHER): Payer: Federal, State, Local not specified - PPO

## 2020-10-18 DIAGNOSIS — J4550 Severe persistent asthma, uncomplicated: Secondary | ICD-10-CM | POA: Diagnosis not present

## 2020-11-15 ENCOUNTER — Other Ambulatory Visit: Payer: Self-pay

## 2020-11-15 ENCOUNTER — Ambulatory Visit (INDEPENDENT_AMBULATORY_CARE_PROVIDER_SITE_OTHER): Payer: Federal, State, Local not specified - PPO | Admitting: *Deleted

## 2020-11-15 DIAGNOSIS — J4550 Severe persistent asthma, uncomplicated: Secondary | ICD-10-CM | POA: Diagnosis not present

## 2020-11-20 ENCOUNTER — Other Ambulatory Visit: Payer: Self-pay

## 2020-11-20 ENCOUNTER — Ambulatory Visit: Payer: Federal, State, Local not specified - PPO | Admitting: Allergy and Immunology

## 2020-11-20 VITALS — BP 122/80 | HR 89 | Temp 98.0°F | Resp 18 | Wt 197.2 lb

## 2020-11-20 DIAGNOSIS — Z8669 Personal history of other diseases of the nervous system and sense organs: Secondary | ICD-10-CM | POA: Diagnosis not present

## 2020-11-20 DIAGNOSIS — Z91018 Allergy to other foods: Secondary | ICD-10-CM

## 2020-11-20 DIAGNOSIS — H6983 Other specified disorders of Eustachian tube, bilateral: Secondary | ICD-10-CM

## 2020-11-20 DIAGNOSIS — J3089 Other allergic rhinitis: Secondary | ICD-10-CM | POA: Diagnosis not present

## 2020-11-20 DIAGNOSIS — J4550 Severe persistent asthma, uncomplicated: Secondary | ICD-10-CM | POA: Diagnosis not present

## 2020-11-20 DIAGNOSIS — Z8709 Personal history of other diseases of the respiratory system: Secondary | ICD-10-CM | POA: Diagnosis not present

## 2020-11-20 MED ORDER — FLOVENT HFA 110 MCG/ACT IN AERO: 2.0000 | INHALATION_SPRAY | Freq: Two times a day (BID) | RESPIRATORY_TRACT | 2 refills | Status: DC

## 2020-11-20 MED ORDER — BREZTRI AEROSPHERE 160-9-4.8 MCG/ACT IN AERO: 2.0000 | INHALATION_SPRAY | Freq: Two times a day (BID) | RESPIRATORY_TRACT | 5 refills | Status: DC

## 2020-11-20 NOTE — Patient Instructions (Addendum)
  1. Start Breztri - 2 inhalations 2 times per day with spacer (Symbicort)  2. Continue Nasacort one spray each nostril 3-7 times per week   3. Continue omeprazole 20 mg 1 time per day  4. Continue Proventil HFA if needed  5. During "flareup" add Flovent 110 - two inhalation 2 times per day to [facility]  6. Submit for Tezepelumab administration to replace Xolair  7. Visit with [facility] ENT about chronic mastoiditis  8. Return to clinic in 6 months or earlier if problem

## 2020-11-20 NOTE — Progress Notes (Signed)
[place]   Follow-up Note  Referring Provider: Allyne Gee, MD Primary Provider: Allyne Gee, MD Date of Office Visit: 11/20/2020  Subjective:   Anne Kerr (DOB: [birth year]) is a 70 y.o. female who returns to the [facility] [place] on 11/20/2020 in re-evaluation of the following:  HPI: Avari returns to this clinic in evaluation of asthma and allergic rhinitis and chronic sinusitis and history of chronic ETD with mastoiditis and a history of reflux and food allergy directed against shellfish. Her last visit to this clinic was 24 January 2020.  She states that she is doing well with her asthma but on further questioning she always has a little bit of cough and always has some wheezing and she has to cough to clear out her lungs.  She does not use a short acting bronchodilator.  She does continue to use Xolair and she continues to use Symbicort on a regular basis.  It does not sound as though she has required a systemic steroid to treat an exacerbation since her last visit.  Her nose is doing okay but still she has some congestion on a pretty regular basis even while using a nasal steroid.  It does not sound as though she has required an antibiotic to treat an episode of sinusitis.  She still has lots of fullness in her ears.  Her ENT doctor has retired and she needs a new ENT doctor.  Her reflux is under good control at this point in time.  She remains away from consumption of shellfish.  She is received 3 Pfizer COVID vaccines and a flu vaccine.  Allergies as of 11/20/2020      Reactions   Aspirin Other (See Comments)   Wheeze   Ibuprofen Shortness Of Breath, Other (See Comments), Cough   Wheeze   Other Swelling, Other (See Comments)   Bee Sting Asthma Symptoms Asthma Symptoms   Shellfish Allergy Other (See Comments)   Asthma Symptoms   Bee Venom Swelling   Iodine    ASTHMA SYMPTOMS      Medication List       acetaminophen 500 MG tablet Commonly known as: TYLENOL Take one to two if needed   albuterol 108 (90 Base) MCG/ACT inhaler Commonly known as: VENTOLIN HFA Inhale 2 puffs into the lungs every 4 (four) hours as needed for wheezing or shortness of breath.   albuterol (2.5 MG/3ML) 0.083% nebulizer solution Commonly known as: PROVENTIL Take 3 mLs (2.5 mg total) by nebulization every 4 (four) hours as needed for wheezing or shortness of breath.   alendronate 70 MG tablet Commonly known as: FOSAMAX Take by mouth.   ascorbic acid 500 MG tablet Commonly known as: VITAMIN C Take one every day   azelastine 0.1 % nasal spray Commonly known as: ASTELIN USE ONE SPRAY IN EACH NOSTRIL TWICE DAILY X 1 WEEK.   BD Disp Needles 25G X 5/8" Misc Generic drug: NEEDLE (DISP) 25 G USE AS DIRECTED TO ADMINISTER XOLAIR   budesonide-formoterol 160-4.5 MCG/ACT inhaler Commonly known as: Symbicort Inhale 2 puffs into the lungs 2 (two) times daily.   CALCIUM 600+D PO Take 1 tablet by mouth daily.   cetirizine 10 MG tablet Commonly known as: ZYRTEC Take 10 mg by mouth every morning.   EPINEPHrine 0.3 mg/0.3 mL Soaj injection Commonly known as: EPI-PEN epinephrine 0.3 mg/0.3 mL injection, auto-injector   Flovent HFA 110 MCG/ACT inhaler Generic drug: fluticasone INHALE 2  PUFFS INTO THE LUNGS 2 (TWO) TIMES DAILY. USE DURING ASTHMA FLARES.   metFORMIN 500 MG tablet Commonly known as: GLUCOPHAGE Take by mouth.   metoprolol succinate 25 MG 24 hr tablet Commonly known as: TOPROL-XL Take by mouth.   montelukast 10 MG tablet Commonly known as: SINGULAIR TAKE 1 TABLET BY MOUTH EVERYDAY AT BEDTIME   NASACORT AQ NA Place 2 sprays into the nose as needed.   omeprazole 20 MG capsule Commonly known as: PRILOSEC Take by mouth.   rivaroxaban 10 MG Tabs tablet Commonly known as: XARELTO Xarelto 10 mg tablet  TAKE 1 TABLET (10 MG TOTAL) BY MOUTH DAILY WITH BREAKFAST.   rosuvastatin 20 MG  tablet Commonly known as: CRESTOR Take by mouth.   UNABLE TO FIND Fluzone High-Dose Quad 2020-21 (PF) 240 mcg/0.7 mL IM syringe  PHARMACY ADMINISTERED   valsartan-hydrochlorothiazide 80-12.5 MG tablet Commonly known as: DIOVAN-HCT Take by mouth.   VITAMIN B-12 PO Take 3,000 mcg by mouth daily.   Vitamin D-3 125 MCG (5000 UT) Tabs Take 5,000 Units by mouth daily.   Xolair 150 MG injection Generic drug: omalizumab RECONSTITUTE VIAL WITH 1.4 ML STERILE WATER . INJECT 1.2 ML UNDER THE SKIN (SUBCUTANEOUS INJECTION) EVERY 4 WEEKS REFRIGERATE       Past Medical History:  Diagnosis Date  . Arthritis   . Asthma    allergy induced  . Complication of anesthesia    hard to wake up  . Diabetes mellitus without complication ([place])   . GERD (gastroesophageal reflux disease)   . High blood pressure   . History of kidney stones    h/o  . Hyperlipidemia   . Leaky heart valve   . Leaky heart valve   . Tachycardia   . TIA (transient ischemic attack)    no residual side effects-pt states she didnt even know- a doctor told her this  . Uterine cancer ([place]) 1989    Past Surgical History:  Procedure Laterality Date  . COLONOSCOPY    . KNEE ARTHROSCOPY Right   . NASAL SINUS SURGERY     x 4  . PARTIAL HYSTERECTOMY    . SINOSCOPY    . TOTAL KNEE ARTHROPLASTY Left 05/11/2019   Procedure: TOTAL KNEE ARTHROPLASTY;  Surgeon: Lovell Sheehan, MD;  Location: [facility] ORS;  Service: Orthopedics;  Laterality: Left;    Review of systems negative except as noted in HPI / PMHx or noted below:  Review of Systems  Constitutional: Negative.   HENT: Negative.   Eyes: Negative.   Respiratory: Negative.   Cardiovascular: Negative.   Gastrointestinal: Negative.   Genitourinary: Negative.   Musculoskeletal: Negative.   Skin: Negative.   Neurological: Negative.   Endo/Heme/Allergies: Negative.   Psychiatric/Behavioral: Negative.      Objective:   Vitals:   11/20/20 1519  BP: 122/80  Pulse:  89  Resp: 18  Temp: 98 F (36.7 C)  SpO2: 98%      Weight: 197 lb 3.2 oz (89.4 kg)   Physical Exam Constitutional:      Appearance: She is not diaphoretic.  HENT:     Head: Normocephalic.     Right Ear: Ear canal and external ear normal. Tympanic membrane is scarred.     Left Ear: Ear canal and external ear normal. Tympanic membrane is scarred.     Nose: Nose normal. No mucosal edema or rhinorrhea.     Mouth/Throat:     Mouth: Oropharynx is clear and moist and mucous membranes are normal.  Pharynx: Uvula midline. No oropharyngeal exudate.  Eyes:     Conjunctiva/sclera: Conjunctivae normal.  Neck:     Thyroid: No thyromegaly.     Trachea: Trachea normal. No tracheal tenderness or tracheal deviation.  Cardiovascular:     Rate and Rhythm: Normal rate and regular rhythm.     Heart sounds: Normal heart sounds, S1 normal and S2 normal. No murmur heard.   Pulmonary:     Effort: No respiratory distress.     Breath sounds: Normal breath sounds. No stridor. No wheezing (Bilateral inspiratory and expiratory wheezes all lung fields) or rales.  Musculoskeletal:        General: No edema.  Lymphadenopathy:     Head:     Right side of head: No tonsillar adenopathy.     Left side of head: No tonsillar adenopathy.     Cervical: No cervical adenopathy.  Skin:    Findings: No erythema or rash.     Nails: There is no clubbing.  Neurological:     Mental Status: She is alert.     Diagnostics:    Spirometry was performed and demonstrated an FEV1 of 1.22 at 75 % of predicted.  Assessment and Plan:   1. Not well controlled severe persistent asthma   2. Other allergic rhinitis   3. History of chronic sinusitis   4. History of mastoiditis   5. Dysfunction of both eustachian tubes   6. Food allergy     1. Start Breztri - 2 inhalations 2 times per day with spacer (Symbicort)  2. Continue Nasacort one spray each nostril 3-7 times per week   3. Continue omeprazole 20 mg 1 time  per day  4. Continue Proventil HFA if needed  5. During "flareup" add Flovent 110 - two inhalation 2 times per day to [facility]  6. Submit for Tezepelumab administration to replace Xolair  7. Visit with [facility] ENT about chronic mastoiditis  8. Return to clinic in 6 months or earlier if problem  Kodie still has evidence of significant inflammation affecting her airway even in the face of utilizing multiple agents directed against respiratory tract inflammation including use of omalizumab.  We will now start her on a triple inhaler and attempt to obtain approval for tezepelumab administration.  As well, we need for her to see ENT in follow-up as she has lost her previous ENT doctor secondary to retirement.  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  Allena Katz, MD Allergy / Immunology [place]

## 2020-11-21 ENCOUNTER — Encounter: Payer: Self-pay | Admitting: Allergy and Immunology

## 2020-12-13 ENCOUNTER — Other Ambulatory Visit: Payer: Self-pay

## 2020-12-13 ENCOUNTER — Ambulatory Visit (INDEPENDENT_AMBULATORY_CARE_PROVIDER_SITE_OTHER): Payer: Federal, State, Local not specified - PPO | Admitting: *Deleted

## 2020-12-13 DIAGNOSIS — J4550 Severe persistent asthma, uncomplicated: Secondary | ICD-10-CM | POA: Diagnosis not present

## 2020-12-26 NOTE — Progress Notes (Signed)
Mailed quick start application to patient for [employer]

## 2020-12-31 ENCOUNTER — Other Ambulatory Visit: Payer: Self-pay | Admitting: Allergy and Immunology

## 2021-01-10 ENCOUNTER — Other Ambulatory Visit: Payer: Self-pay

## 2021-01-10 ENCOUNTER — Ambulatory Visit (INDEPENDENT_AMBULATORY_CARE_PROVIDER_SITE_OTHER): Payer: Federal, State, Local not specified - PPO | Admitting: *Deleted

## 2021-01-10 DIAGNOSIS — J4550 Severe persistent asthma, uncomplicated: Secondary | ICD-10-CM

## 2021-02-07 ENCOUNTER — Other Ambulatory Visit: Payer: Self-pay

## 2021-02-07 ENCOUNTER — Ambulatory Visit (INDEPENDENT_AMBULATORY_CARE_PROVIDER_SITE_OTHER): Payer: Federal, State, Local not specified - PPO | Admitting: *Deleted

## 2021-02-07 DIAGNOSIS — J4550 Severe persistent asthma, uncomplicated: Secondary | ICD-10-CM | POA: Diagnosis not present

## 2021-03-07 ENCOUNTER — Ambulatory Visit: Payer: Self-pay

## 2021-03-12 ENCOUNTER — Other Ambulatory Visit: Payer: Self-pay

## 2021-03-12 ENCOUNTER — Ambulatory Visit (INDEPENDENT_AMBULATORY_CARE_PROVIDER_SITE_OTHER): Payer: Federal, State, Local not specified - PPO | Admitting: *Deleted

## 2021-03-12 DIAGNOSIS — J4550 Severe persistent asthma, uncomplicated: Secondary | ICD-10-CM | POA: Diagnosis not present

## 2021-03-12 MED ORDER — TEZEPELUMAB-EKKO 210 MG/1.91ML ~~LOC~~ SOSY
210.0000 mg | PREFILLED_SYRINGE | SUBCUTANEOUS | Status: DC
  Administered 2021-03-12 – 2024-03-08 (×39): 210 mg via SUBCUTANEOUS

## 2021-03-12 NOTE — Progress Notes (Signed)
Immunotherapy   Patient Details  Name: Anne Kerr MRN: [id number] Date of Birth: Jul 27, 1951  03/12/2021  Dorothe Pea started injections for  Tezspire  Frequency:Every 28 days  Epi-Pen:Epi-Pen Available  Consent signed and patient instructions given.  Patient started Tezspire today and received 1.73mL in the LUA. Patient waited 30 minutes in office and did not experience any issues.   Eveline Sauve Fernandez-Vernon 03/12/2021, 2:47 PM

## 2021-04-01 ENCOUNTER — Other Ambulatory Visit: Payer: Self-pay

## 2021-04-01 ENCOUNTER — Emergency Department: Payer: Federal, State, Local not specified - PPO

## 2021-04-01 ENCOUNTER — Emergency Department
Admission: EM | Admit: 2021-04-01 | Discharge: 2021-04-02 | Disposition: A | Discharge status: Home / Self Care | Payer: Federal, State, Local not specified - PPO | Attending: Emergency Medicine | Admitting: Emergency Medicine

## 2021-04-01 DIAGNOSIS — Z96652 Presence of left artificial knee joint: Secondary | ICD-10-CM | POA: Diagnosis not present

## 2021-04-01 DIAGNOSIS — Z8541 Personal history of malignant neoplasm of cervix uteri: Secondary | ICD-10-CM | POA: Diagnosis not present

## 2021-04-01 DIAGNOSIS — Z7984 Long term (current) use of oral hypoglycemic drugs: Secondary | ICD-10-CM | POA: Diagnosis not present

## 2021-04-01 DIAGNOSIS — R0602 Shortness of breath: Secondary | ICD-10-CM | POA: Diagnosis present

## 2021-04-01 DIAGNOSIS — E119 Type 2 diabetes mellitus without complications: Secondary | ICD-10-CM | POA: Insufficient documentation

## 2021-04-01 DIAGNOSIS — Z79899 Other long term (current) drug therapy: Secondary | ICD-10-CM | POA: Insufficient documentation

## 2021-04-01 DIAGNOSIS — Z7901 Long term (current) use of anticoagulants: Secondary | ICD-10-CM | POA: Insufficient documentation

## 2021-04-01 DIAGNOSIS — J9801 Acute bronchospasm: Secondary | ICD-10-CM | POA: Diagnosis not present

## 2021-04-01 DIAGNOSIS — J069 Acute upper respiratory infection, unspecified: Secondary | ICD-10-CM | POA: Diagnosis not present

## 2021-04-01 DIAGNOSIS — Z20822 Contact with and (suspected) exposure to covid-19: Secondary | ICD-10-CM | POA: Insufficient documentation

## 2021-04-01 LAB — CBC
HCT: 39.2 % (ref 36.0–46.0)
Hemoglobin: 12.3 g/dL (ref 12.0–15.0)
MCH: 26.2 pg (ref 26.0–34.0)
MCHC: 31.4 g/dL (ref 30.0–36.0)
MCV: 83.4 fL (ref 80.0–100.0)
Platelets: 210 10*3/uL (ref 150–400)
RBC: 4.7 MIL/uL (ref 3.87–5.11)
RDW: 15.3 % (ref 11.5–15.5)
WBC: 7.6 10*3/uL (ref 4.0–10.5)
nRBC: 0 % (ref 0.0–0.2)

## 2021-04-01 LAB — RESP PANEL BY RT-PCR (FLU A&B, COVID) ARPGX2
Influenza A by PCR: NEGATIVE
Influenza B by PCR: NEGATIVE
SARS Coronavirus 2 by RT PCR: NEGATIVE

## 2021-04-01 LAB — BASIC METABOLIC PANEL
Anion gap: 7 (ref 5–15)
BUN: 11 mg/dL (ref 8–23)
CO2: 25 mmol/L (ref 22–32)
Calcium: 9.3 mg/dL (ref 8.9–10.3)
Chloride: 104 mmol/L (ref 98–111)
Creatinine, Ser: 0.92 mg/dL (ref 0.44–1.00)
GFR, Estimated: >60 mL/min (ref >60)
Glucose, Bld: 112 mg/dL — ABNORMAL HIGH (ref 70–99)
Potassium: 3.7 mmol/L (ref 3.5–5.1)
Sodium: 136 mmol/L (ref 135–145)

## 2021-04-01 LAB — TROPONIN I (HIGH SENSITIVITY): Troponin I (High Sensitivity): 5 ng/L (ref <18)

## 2021-04-01 MED ORDER — BENZONATATE 100 MG PO CAPS
100.0000 mg | ORAL_CAPSULE | Freq: Once | ORAL | Status: AC
  Administered 2021-04-01: 100 mg via ORAL
  Filled 2021-04-01: qty 1

## 2021-04-01 MED ORDER — BENZONATATE 100 MG PO CAPS: 100.0000 mg | ORAL_CAPSULE | Freq: Four times a day (QID) | ORAL | 0 refills | Status: AC | PRN

## 2021-04-01 MED ORDER — ACETAMINOPHEN 500 MG PO TABS
1000.0000 mg | ORAL_TABLET | ORAL | Status: AC
  Administered 2021-04-01: 1000 mg via ORAL
  Filled 2021-04-01: qty 2

## 2021-04-01 MED ORDER — MENTHOL 3 MG MT LOZG
1.0000 | LOZENGE | OROMUCOSAL | Status: DC | PRN
  Filled 2021-04-01: qty 9

## 2021-04-01 MED ORDER — PREDNISONE 20 MG PO TABS
40.0000 mg | ORAL_TABLET | Freq: Once | ORAL | Status: AC
  Administered 2021-04-01: 40 mg via ORAL
  Filled 2021-04-01: qty 2

## 2021-04-01 MED ORDER — PREDNISONE 50 MG PO TABS: ORAL_TABLET | ORAL | 0 refills | Status: DC

## 2021-04-01 MED ORDER — IPRATROPIUM-ALBUTEROL 0.5-2.5 (3) MG/3ML IN SOLN
3.0000 mL | Freq: Once | RESPIRATORY_TRACT | Status: AC
  Administered 2021-04-01: 3 mL via RESPIRATORY_TRACT
  Filled 2021-04-01: qty 3

## 2021-04-01 NOTE — ED Triage Notes (Addendum)
Pt states coming in for shortness of breath, sore throat, nasal congestion and cough. Pt states it started 4 days ago. Pt states history of asthma. Pt states the cold air makes it worse. Pt states  she tried over the counter medications and nothing worked.

## 2021-04-01 NOTE — Discharge Instructions (Addendum)
Take steroid as directed.  You may take Tessalon Perles as needed for cough.  Use nebulizer every 4 hours as needed for difficulty breathing.  Return to the ER for worsening symptoms, persistent vomiting, difficulty breathing or other concerns.

## 2021-04-01 NOTE — ED Provider Notes (Signed)
[facility] Emergency Department Provider Note   ____________________________________________   Event Date/Time   First MD Initiated Contact with Patient 04/01/21 2208     (approximate)  I have reviewed the triage vital signs and the nursing notes.   HISTORY  Chief Complaint Shortness of Breath (And a sore throat)    HPI RAECHEL MARCOS is a 70 y.o. female with a history of seasonal allergies, asthma, diabetes  Patient reports for about 4 days now she has been suffering a "head cold" which she reports feels like allergies congestion nasal congestion, but she is also noticed the last day and a half a bit of a sore throat.  She is able to swallow and is not difficult to swallow but the back of her throat feels sore.  In addition she has had a's slight dry cough but it seems to be more prominent at night making it difficult for her to sleep.  She has not had any chest pain.  She does not feel particularly short of breath but reports a cough and head cold do seem to be fairly constant worse at night difficult sleeping  Still eating and drinking well.  No nausea no vomiting no diarrhea.  Working to stay hydrated she reports + issue  No leg swelling no calf or muscle pains.  She has not noticed a fever.  She has uses albuterol use her inhaler 1 or 2 puffs before she goes to bed in the evening and reports that does seem to help but does not last the night.  Noticing some slight wheezing at times Past Medical History:  Diagnosis Date   Arthritis    Asthma    allergy induced   Complication of anesthesia    hard to wake up   Diabetes mellitus without complication (HCC)    GERD (gastroesophageal reflux disease)    High blood pressure    History of kidney stones    h/o   Hyperlipidemia    Leaky heart valve    Leaky heart valve    Tachycardia    TIA (transient ischemic attack)    no residual side effects-pt states she didnt even know- a doctor told her  this   Uterine cancer ([place]) 1989    Patient Active Problem List   Diagnosis Date Noted   Pain due to onychomycosis of toenails of both feet 11/24/2019   Corns and callosities 11/24/2019   Hav (hallux abducto valgus), unspecified laterality 11/24/2019   Diabetes mellitus without complication ([place]) 94/17/4081   S/P TKR (total knee replacement) using cement, left 05/11/2019   Severe persistent asthma with exacerbation 07/25/2015   Acute sinusitis 07/25/2015   Severe persistent asthma 06/02/2015   Allergic rhinoconjunctivitis 06/02/2015   Bee sting reaction 06/02/2015   GERD (gastroesophageal reflux disease) 06/02/2015   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 Authorizing Provider  benzonatate (TESSALON PERLES) 100 MG capsule Take 1 capsule (  100 mg total) by mouth every 6 (six) hours as needed for cough. 04/01/21 04/01/22 Yes Delman Kitten, MD  predniSONE (DELTASONE) 50 MG tablet 1 tab by mouth daily 04/01/21  Yes Delman Kitten, MD  acetaminophen (TYLENOL) 500 MG tablet Take one to two if needed    [provider]  albuterol (PROVENTIL) (2.5 MG/3ML) 0.083% nebulizer solution Take 3 mLs (2.5 mg total) by nebulization every 4 (four) hours as needed for wheezing or shortness of breath. 08/14/15   Bobbitt, Sedalia Muta, MD  albuterol (VENTOLIN HFA) 108 (90 Base) MCG/ACT inhaler Inhale 2 puffs into the lungs every 4 (four) hours as needed for wheezing or shortness of breath.    [provider]   alendronate (FOSAMAX) 70 MG tablet Take by mouth. 02/25/19   [provider]  ascorbic acid (VITAMIN C) 500 MG tablet Take one every day    [provider]  azelastine (ASTELIN) 0.1 % nasal spray USE ONE SPRAY IN EACH NOSTRIL TWICE DAILY X 1 WEEK. 11/14/19   [provider]  BD DISP NEEDLES 25G X 5/8" MISC USE AS DIRECTED TO ADMINISTER Arvid Right 11/13/15   Kozlow, Donnamarie Poag, MD  Budeson-Glycopyrrol-Formoterol (BREZTRI AEROSPHERE) 160-9-4.8 MCG/ACT AERO Inhale 2 puffs into the lungs in the morning and at bedtime. 11/20/20   Kozlow, Donnamarie Poag, MD  budesonide-formoterol (SYMBICORT) 160-4.5 MCG/ACT inhaler Inhale 2 puffs into the lungs 2 (two) times daily. 01/24/20   Kozlow, Donnamarie Poag, MD  Calcium Carbonate-Vitamin D (CALCIUM 600+D PO) Take 1 tablet by mouth daily.    [provider]  cetirizine (ZYRTEC) 10 MG tablet Take 10 mg by mouth every morning.     [provider]  Cholecalciferol (VITAMIN D-3) 125 MCG (5000 UT) TABS Take 5,000 Units by mouth daily.     [provider]  Cyanocobalamin (VITAMIN B-12 PO) Take 3,000 mcg by mouth daily.     [provider]  EPINEPHrine 0.3 mg/0.3 mL IJ SOAJ injection epinephrine 0.3 mg/0.3 mL injection, auto-injector    [provider]  fluticasone (FLOVENT HFA) 110 MCG/ACT inhaler Inhale 2 puffs into the lungs 2 (two) times daily. Use during asthma flares. 11/20/20   Kozlow, Donnamarie Poag, MD  metFORMIN (GLUCOPHAGE) 500 MG tablet Take by mouth.    [provider]  metoprolol succinate (TOPROL-XL) 25 MG 24 hr tablet Take by mouth. 07/28/19   [provider]  montelukast (SINGULAIR) 10 MG tablet TAKE 1 TABLET BY MOUTH EVERYDAY AT BEDTIME 03/15/19   Kozlow, Donnamarie Poag, MD  omalizumab Arvid Right) 150 MG injection RECONSTITUTE VIAL WITH 1.4 ML STERILE WATER . INJECT 1.2 ML UNDER THE SKIN (SUBCUTANEOUS INJECTION) EVERY 4 WEEKS REFRIGERATE 09/03/20   Kozlow, Donnamarie Poag, MD  omeprazole (PRILOSEC) 20 MG capsule Take by  mouth. 07/18/19   [provider]  rivaroxaban (XARELTO) 10 MG TABS tablet Xarelto 10 mg tablet  TAKE 1 TABLET (10 MG TOTAL) BY MOUTH DAILY WITH BREAKFAST.    [provider]  rosuvastatin (CRESTOR) 20 MG tablet Take by mouth. 07/15/19   [provider]  Triamcinolone Acetonide (NASACORT AQ NA) Place 2 sprays into the nose as needed.     [provider]  UNABLE TO FIND Fluzone High-Dose Quad 2020-21 (PF) 240 mcg/0.7 mL IM syringe  PHARMACY ADMINISTERED    [provider]  valsartan-hydrochlorothiazide (DIOVAN-HCT) 80-12.5 MG tablet Take by mouth. 07/18/19   [provider]    Allergies Aspirin, Ibuprofen, Other, Shellfish allergy, Bee venom, and Iodine  Family History  Problem Relation Age of Onset   Allergic rhinitis Neg Hx    Angioedema Neg Hx    Asthma Neg Hx    Atopy Neg Hx    Eczema Neg Hx    Immunodeficiency Neg Hx    Urticaria Neg Hx     Social History Social History   Tobacco Use   Smoking status: Never   Smokeless tobacco: Never  Vaping Use   Vaping Use: Never used  Substance Use Topics   Alcohol use: No    Alcohol/week: 0.0 standard drinks   Drug use: No    Review of Systems Constitutional: No fever/chills Eyes: No visual changes. ENT: See HPI Cardiovascular: Denies chest pain. Respiratory: See HPI Gastrointestinal: No abdominal pain.   Genitourinary: Negative for dysuria. Musculoskeletal: Negative for back pain. Skin: Negative for rash. Neurological: Negative for headaches, areas of focal weakness or numbness.    ____________________________________________   PHYSICAL EXAM:  VITAL SIGNS: ED Triage Vitals  Enc Vitals Group     BP 04/01/21 2103 (!) 155/87     Pulse Rate 04/01/21 2103 (!) 103     Resp 04/01/21 2103 20     Temp 04/01/21 2103 98.8 F (37.1 C)     Temp Source 04/01/21 2103 Oral     SpO2 04/01/21 2103 96 %     Weight 04/01/21 2103 198 lb (89.8 kg)     Height 04/01/21 2103 5'  3" (1.6 m)     Head Circumference --      Peak Flow --      Pain Score 04/01/21 2110 0     Pain Loc --      Pain Edu? --      Excl. in [place]? --     Constitutional: Alert and oriented. Well appearing and in no acute distress. Eyes: Conjunctivae are normal. Head: Atraumatic. Nose: Mild clear coryza. Mouth/Throat: Mucous membranes are moist.  Posterior oropharynx is moderately injected.  No edema or masses.  Widely patent.  Tonsils are surgically absent Neck: No stridor.  Cardiovascular: Normal rate, regular rhythm. Grossly normal heart sounds.  Good peripheral circulation. Respiratory: Normal respiratory effort.  No retractions. Lungs CTAB except for mild end expiratory wheezing notable throughout.  She speaks in full clear sentences.  Oxygen saturation 99% on room air. Gastrointestinal: Soft and nontender. No distention. Musculoskeletal: No lower extremity tenderness nor edema.  No edema.  No venous cords or congestion.  No calf tenderness. Neurologic:  Normal speech and language. No gross focal neurologic deficits are appreciated.  Skin:  Skin is warm, dry and intact. No rash noted. Psychiatric: Mood and affect are normal. Speech and behavior are normal.  ____________________________________________   LABS (all labs ordered are listed, but only abnormal results are displayed)  Labs Reviewed  BASIC METABOLIC PANEL - Abnormal; Notable for the following components:      Result Value   Glucose, Bld 112 (*)    All other components within normal limits  RESP PANEL BY RT-PCR (FLU A&B, COVID) ARPGX2  CBC  TROPONIN I (HIGH SENSITIVITY)   ____________________________________________  EKG  Reviewed inter by me at 2120 Heart rate 100 QRS 89 QTc 440 Normal sinus rhythm no evidence of ischemia ____________________________________________  RADIOLOGY  DG Chest 2 View  Result Date: 04/01/2021 CLINICAL DATA:  Cough and shortness of breath. EXAM: CHEST - 2 VIEW COMPARISON:  Radiograph  04/30/2016 FINDINGS: The cardiomediastinal contours are normal. Subsegmental atelectasis in the lingula. Pulmonary vasculature is normal. No consolidation, pleural effusion, or  pneumothorax. Stable sclerosis involving right posterior fifth rib, likely a benign bone island. No acute osseous abnormalities are seen. IMPRESSION: Subsegmental lingular atelectasis. Electronically Signed   By: Keith Rake M.D.   On: 04/01/2021 21:37    Imaging studies reviewed, mild atelectasis.  No evidence of acute infiltrate. ____________________________________________   PROCEDURES  Procedure(s) performed: None  Procedures  Critical Care performed: No  ____________________________________________   INITIAL IMPRESSION / ASSESSMENT AND PLAN / ED COURSE  Pertinent labs & imaging results that were available during my care of the patient were reviewed by me and considered in my medical decision making (see chart for details).   Patient presents for what appears to be primarily upper respiratory symptoms congestion pharyngitis cough more so at night.  Hemodynamically stabile very reassuring respiratory exam but does exhibit some mild wheezing does have a history of asthma.  I think her overall constellation of symptoms seem to be most suggestive of mild upper respiratory infection, no evidence of pneumonia.  Do not see evidence of obvious acute bacterial infection.  Labs to this point reviewed, reassuring normal CBC.  Basic metabolic panel also normal.  After treatment with nebulizer and steroid patient resting comfortably.  ----------------------------------------- 11:45 PM on 04/01/2021 ----------------------------------------- Ongoing care assigned to Dr. Jerl Santos, follow-up on reassessment and pending COVID test.  Anticipate patient likely be able to discharge if continues to do well, treatment with prednisone and Tessalon Perles in addition to her home albuterol for apparent bronchospasm in the setting of  likely upper respiratory infection.      ____________________________________________   FINAL CLINICAL IMPRESSION(S) / ED DIAGNOSES  Final diagnoses:  Bronchospasm, acute  Upper respiratory tract infection, unspecified type        Note:  This document was prepared using Dragon voice recognition software and may include unintentional dictation errors       Delman Kitten, MD 04/01/21 2345

## 2021-04-01 NOTE — ED Notes (Signed)
EKG signed by EDP

## 2021-04-02 NOTE — ED Notes (Signed)
Unable to obtain signature at time of discharge as no signature pad was available. Reviewed discharge instructions and pt and family at bedside verbalized understanding. Assisted pt to car via wheelchair where she was met by her significant other who drove her home.

## 2021-04-02 NOTE — ED Provider Notes (Signed)
-----------------------------------------   12:21 AM on 04/02/2021 -----------------------------------------  COVID-negative.  Patient feeling significantly better after nebulizer treatment.  Will discharge home per previous providers instructions.  Strict return precautions given.  Patient and spouse verbalized understanding agrees with plan of care.   Paulette Blanch, MD 04/02/21 [phone]

## 2021-04-03 NOTE — Progress Notes (Signed)
[address] [place] [address] Dept: [phone]  FOLLOW UP NOTE  Patient ID: Anne Kerr, female    DOB: 08/13/1951  Age: 70 y.o. MRN: [id number] Date of Office Visit: 04/04/2021  Assessment  Chief Complaint: Asthma (Went to the hospital Monday was having difficulty breathing and was given prednisone. Tried to use her inhalers but her voice gotten worse. Had a neb treatment at the hospital ) and Cough (Constant deep cough that keeps her up at night )  HPI Anne Kerr is a 70 year old female who presents the clinic for evaluation of asthma with acute exacerbation.  She was last seen in this clinic on 11/20/2020 by Dr. Neldon Mc for evaluation of asthma, allergic rhinitis, eustachian tube dysfunction, reflux, and food allergy to shellfish.  In the interim, she went to emergency department on 7 4 for evaluation of asthma with acute exacerbation.  At that visit, she had a negative chest x-ray and was prescribed oral steroid and Tessalon Perles.  At today's visit, she reports her asthma has been short poorly controlled with symptoms including shortness of breath with activity and rest, wheeze occurring in the daytime and nighttime, and cough producing thick green mucus.  Prior to her visit to the emergency department she had been using Breztri 2 puffs twice a day with a spacer and albuterol frequently.  She reports that she stopped using Breztri on Tuesday as this was making her throat sore and she could not tolerate this feeling.  She received her last injection of Tezspire a little under 1 month ago.  Allergic rhinitis is reported as poorly controlled with symptoms including thick green nasal drainage, nasal congestion, headache, and thick postnasal drainage.  She continues cetirizine and nasal saline rinses.  Reflux is reported as well controlled with no heartburn or vomiting.  She continues omeprazole 20 mg once a day.  Eustachian tube dysfunction is reported as poorly controlled  with pressure and congestion reported in both years.  She denies changes in hearing or pain in either ear.  Her current medications are listed in the chart.  Drug Allergies:  Allergies  Allergen Reactions   Aspirin Other (See Comments)    Wheeze   Ibuprofen Shortness Of Breath, Other (See Comments) and Cough    Wheeze   Other Swelling and Other (See Comments)    Bee Sting Asthma Symptoms Asthma Symptoms   Shellfish Allergy Other (See Comments)    Asthma Symptoms   Bee Venom Swelling   Iodine     ASTHMA SYMPTOMS    Physical Exam: BP 140/84   Pulse 95   Temp (!) 97.4 F (36.3 C)   Resp 16   Ht 5\' 3"  (1.6 m)   Wt 196 lb (88.9 kg)   SpO2 98%   BMI 34.72 kg/m    Physical Exam Vitals reviewed.  Constitutional:      Appearance: Normal appearance.  HENT:     Head: Normocephalic and atraumatic.     Right Ear: Tympanic membrane normal.     Left Ear: Tympanic membrane normal.     Nose:     Comments: Bilateral nares edematous with thick clear nasal drainage noted.  Pharynx erythematous with no exudate.  Ears normal.  Eyes normal.  Laryngitis noted Eyes:     Conjunctiva/sclera: Conjunctivae normal.  Cardiovascular:     Rate and Rhythm: Normal rate and regular rhythm.     Heart sounds: Normal heart sounds. No murmur heard. Pulmonary:     Effort:  Pulmonary effort is normal.     Comments: Bilateral expiratory wheeze which partially cleared post bronchodilator treatment Musculoskeletal:        General: Normal range of motion.     Cervical back: Normal range of motion and neck supple.  Skin:    General: Skin is warm and dry.  Neurological:     Mental Status: She is alert and oriented to person, place, and time.  Psychiatric:        Mood and Affect: Mood normal.        Behavior: Behavior normal.        Thought Content: Thought content normal.        Judgment: Judgment normal.    Diagnostics: Unable to perform spirometry due to cough  Assessment and Plan: 1. Severe  persistent asthma with acute exacerbation   2. Acute non-recurrent maxillary sinusitis   3. Dysfunction of both eustachian tubes   4. Allergy with anaphylaxis due to food   5. Gastroesophageal reflux disease, unspecified whether esophagitis present   6. Allergic rhinitis due to other allergic trigger, unspecified seasonality     Meds ordered this encounter  Medications   amoxicillin-clavulanate (AUGMENTIN) 875-125 MG tablet    Sig: Take 1 tablet by mouth 2 (two) times daily for 10 days.    Dispense:  20 tablet    Refill:  0   budesonide (PULMICORT) 0.5 MG/2ML nebulizer solution    Sig: Take 2 mLs (0.5 mg total) by nebulization 2 (two) times daily.    Dispense:  100 mL    Refill:  1   ipratropium-albuterol (DUONEB) 0.5-2.5 (3) MG/3ML SOLN    Sig: Take 3 mLs by nebulization every 6 (six) hours as needed.    Dispense:  100 mL    Refill:  1   guaiFENesin (MUCINEX) 600 MG 12 hr tablet    Sig: Take 1 tablet (600 mg total) by mouth 2 (two) times daily.    Dispense:  60 tablet    Refill:  2    Patient Instructions  Asthma with acute exacerbation Begin budesonide 0.5 mg via nebulizer twice a day and begin DuoNeb via nebulizer once every 6 hours.  This will replace Breztri inhaler for now while your throat is irritated. Once your throat is feeling better, we will move forward with changing you back to the inhaler instead of nebulized medications Begin prednisone 10 mg tablets. Take 2 tablets twice a day for 3 days, then take 2 tablets once a day for 1 day, then take 1 tablet on the 5th day, then stop Continue to receive Tezspire injections once a month  Acute sinusitis Begin Augmentin 875 mg twice a day for 10 days Stop cetirizine for the next week, the restart as needed for a runny nose Continue nasal saline rinses and Flonase daily Begin Mucinex [phone] mg twice a day and increase fluid intake to loosen and thin mucus   Allergic rhinitis Continue cetirizine 10 mg once a day as  needed for runny nose beginning next week Continue azelastine 2 sprays in each nostril twice a day as needed for runny nose Continue Nasacort 1 to 2 sprays in each nostril once a day as needed for stuffy nose Consider saline nasal rinses as needed for nasal symptoms. Use this before any medicated nasal sprays for best result  Reflux Continue dietary and lifestyle modifications as listed below Continue omeprazole 20 mg once a day to control reflux  Food allergy Continue to avoid shellfish. In case of  an allergic reaction, take Benadryl 50 mg  every 4 hours, and if life-threatening symptoms occur, inject with EpiPen 0.3 mg.  Eustachian tube dysfunction Follow-up with [facility]NT for chronic mastoiditis evaluation and treatment  Call the clinic if this treatment plan is not working well for you  Follow up in 1 week or sooner if needed.  Return in about 1 week (around 04/11/2021), or if symptoms worsen or fail to improve.    Thank you for the opportunity to care for this patient.  Please do not hesitate to contact me with questions.  Gareth Morgan, FNP [facility]

## 2021-04-03 NOTE — Patient Instructions (Addendum)
Asthma with acute exacerbation Begin budesonide 0.5 mg via nebulizer twice a day and begin DuoNeb via nebulizer once every 6 hours.  This will replace Breztri inhaler for now while your throat is irritated. Once your throat is feeling better, we will move forward with changing you back to the inhaler instead of nebulized medications Begin prednisone 10 mg tablets. Take 2 tablets twice a day for 3 days, then take 2 tablets once a day for 1 day, then take 1 tablet on the 5th day, then stop Continue to receive Tezspire injections once a month  Acute sinusitis Begin Augmentin 875 mg twice a day for 10 days Stop cetirizine for the next week, the restart as needed for a runny nose Continue nasal saline rinses and Flonase daily Begin Mucinex [phone] mg twice a day and increase fluid intake to loosen and thin mucus   Allergic rhinitis Continue cetirizine 10 mg once a day as needed for runny nose beginning next week Continue azelastine 2 sprays in each nostril twice a day as needed for runny nose Continue Nasacort 1 to 2 sprays in each nostril once a day as needed for stuffy nose Consider saline nasal rinses as needed for nasal symptoms. Use this before any medicated nasal sprays for best result  Reflux Continue dietary and lifestyle modifications as listed below Continue omeprazole 20 mg once a day to control reflux  Food allergy Continue to avoid shellfish. In case of an allergic reaction, take Benadryl 50 mg  every 4 hours, and if life-threatening symptoms occur, inject with EpiPen 0.3 mg.  Eustachian tube dysfunction Follow-up with [facility]NT for chronic mastoiditis evaluation and treatment  Call the clinic if this treatment plan is not working well for you  Follow up in 1 week or sooner if needed.

## 2021-04-04 ENCOUNTER — Encounter: Payer: Self-pay | Admitting: Family Medicine

## 2021-04-04 ENCOUNTER — Ambulatory Visit (INDEPENDENT_AMBULATORY_CARE_PROVIDER_SITE_OTHER): Payer: Federal, State, Local not specified - PPO | Admitting: Family Medicine

## 2021-04-04 ENCOUNTER — Other Ambulatory Visit: Payer: Self-pay

## 2021-04-04 VITALS — BP 140/84 | HR 95 | Temp 97.4°F | Resp 16 | Ht 63.0 in | Wt 196.0 lb

## 2021-04-04 DIAGNOSIS — J4551 Severe persistent asthma with (acute) exacerbation: Secondary | ICD-10-CM | POA: Diagnosis not present

## 2021-04-04 DIAGNOSIS — J0100 Acute maxillary sinusitis, unspecified: Secondary | ICD-10-CM

## 2021-04-04 DIAGNOSIS — T7800XA Anaphylactic reaction due to unspecified food, initial encounter: Secondary | ICD-10-CM

## 2021-04-04 DIAGNOSIS — K219 Gastro-esophageal reflux disease without esophagitis: Secondary | ICD-10-CM | POA: Diagnosis not present

## 2021-04-04 DIAGNOSIS — J3089 Other allergic rhinitis: Secondary | ICD-10-CM

## 2021-04-04 DIAGNOSIS — H6983 Other specified disorders of Eustachian tube, bilateral: Secondary | ICD-10-CM

## 2021-04-04 MED ORDER — BUDESONIDE 0.5 MG/2ML IN SUSP: 0.5000 mg | Freq: Two times a day (BID) | RESPIRATORY_TRACT | 1 refills | Status: DC

## 2021-04-04 MED ORDER — AMOXICILLIN-POT CLAVULANATE 875-125 MG PO TABS: 1.0000 | ORAL_TABLET | Freq: Two times a day (BID) | ORAL | 0 refills | Status: DC

## 2021-04-04 MED ORDER — GUAIFENESIN ER 600 MG PO TB12: 600.0000 mg | ORAL_TABLET | Freq: Two times a day (BID) | ORAL | 2 refills | Status: DC

## 2021-04-04 MED ORDER — IPRATROPIUM-ALBUTEROL 0.5-2.5 (3) MG/3ML IN SOLN: 3.0000 mL | Freq: Four times a day (QID) | RESPIRATORY_TRACT | 1 refills | Status: DC | PRN

## 2021-04-05 ENCOUNTER — Encounter: Payer: Self-pay | Admitting: Family Medicine

## 2021-04-05 DIAGNOSIS — H6983 Other specified disorders of Eustachian tube, bilateral: Secondary | ICD-10-CM | POA: Insufficient documentation

## 2021-04-05 DIAGNOSIS — H6993 Unspecified Eustachian tube disorder, bilateral: Secondary | ICD-10-CM | POA: Insufficient documentation

## 2021-04-09 ENCOUNTER — Ambulatory Visit (INDEPENDENT_AMBULATORY_CARE_PROVIDER_SITE_OTHER): Payer: Federal, State, Local not specified - PPO | Admitting: *Deleted

## 2021-04-09 ENCOUNTER — Other Ambulatory Visit: Payer: Self-pay

## 2021-04-09 DIAGNOSIS — J4550 Severe persistent asthma, uncomplicated: Secondary | ICD-10-CM

## 2021-04-09 DIAGNOSIS — J4540 Moderate persistent asthma, uncomplicated: Secondary | ICD-10-CM

## 2021-04-09 NOTE — Patient Instructions (Addendum)
Asthma   Stop budesonide 0.5 mg via nebulizer twice a day  Continue DuoNeb via nebulizer once every 6 hours as needed for cough, wheeze, tightness in chest, or shortness of breath Re-start Breztri using 2 puffs twice a day with spacer to help prevent cough and Continue to receive Tezspire injections once a month  Acute sinusitis Continue  Augmentin 875 mg twice a day. We will add on an additonal 7 days of Augmentin 875 mg taking one tablet twice a day Continue nasal saline rinses  Start Nasacort 1-2 sprays each nostril once a day to help with stuffy nose May use Mucinex [phone] mg twice a day and increase fluid intake to loosen and thin mucus  Allergic rhinitis Continue cetirizine 10 mg once a day as needed for runny nose beginning next week Continue azelastine 2 sprays in each nostril twice a day as needed for runny nose Re-start Nasacort as above Consider saline nasal rinses as needed for nasal symptoms. Use this before any medicated nasal sprays for best result  Reflux Continue dietary and lifestyle modifications as listed below Continue omeprazole 20 mg once a day to control reflux  Food allergy Continue to avoid shellfish. In case of an allergic reaction, take Benadryl 50 mg  every 4 hours, and if life-threatening symptoms occur, inject with EpiPen 0.3 mg.  Eustachian tube dysfunction Keep follow-up with [facility]NT for chronic mastoiditis evaluation and treatment in August  Call the clinic if this treatment plan is not working well for you  Follow up in 1 month or sooner if needed.

## 2021-04-11 ENCOUNTER — Ambulatory Visit (INDEPENDENT_AMBULATORY_CARE_PROVIDER_SITE_OTHER): Payer: Federal, State, Local not specified - PPO | Admitting: Family

## 2021-04-11 ENCOUNTER — Other Ambulatory Visit: Payer: Self-pay

## 2021-04-11 ENCOUNTER — Encounter: Payer: Self-pay | Admitting: Family

## 2021-04-11 VITALS — BP 128/80 | HR 99 | Temp 98.0°F | Resp 18 | Ht 64.0 in | Wt 196.8 lb

## 2021-04-11 DIAGNOSIS — J4550 Severe persistent asthma, uncomplicated: Secondary | ICD-10-CM

## 2021-04-11 DIAGNOSIS — J3089 Other allergic rhinitis: Secondary | ICD-10-CM | POA: Diagnosis not present

## 2021-04-11 DIAGNOSIS — T7800XA Anaphylactic reaction due to unspecified food, initial encounter: Secondary | ICD-10-CM

## 2021-04-11 DIAGNOSIS — J0100 Acute maxillary sinusitis, unspecified: Secondary | ICD-10-CM | POA: Diagnosis not present

## 2021-04-11 DIAGNOSIS — H6983 Other specified disorders of Eustachian tube, bilateral: Secondary | ICD-10-CM

## 2021-04-11 DIAGNOSIS — K219 Gastro-esophageal reflux disease without esophagitis: Secondary | ICD-10-CM

## 2021-04-11 MED ORDER — TRIAMCINOLONE ACETONIDE 55 MCG/ACT NA AERO: 2.0000 | INHALATION_SPRAY | Freq: Every day | NASAL | 5 refills | Status: DC

## 2021-04-11 MED ORDER — BREZTRI AEROSPHERE 160-9-4.8 MCG/ACT IN AERO: 2.0000 | INHALATION_SPRAY | Freq: Two times a day (BID) | RESPIRATORY_TRACT | 5 refills | Status: DC

## 2021-04-11 MED ORDER — CETIRIZINE HCL 10 MG PO TABS: 10.0000 mg | ORAL_TABLET | Freq: Every day | ORAL | 5 refills | Status: DC

## 2021-04-11 MED ORDER — AMOXICILLIN-POT CLAVULANATE 875-125 MG PO TABS: 1.0000 | ORAL_TABLET | Freq: Two times a day (BID) | ORAL | 0 refills | Status: AC

## 2021-04-11 NOTE — Progress Notes (Signed)
[address] [place] [address] Dept: [phone]  FOLLOW UP NOTE  Patient ID: Anne Kerr, female    DOB: 1951/05/30  Age: 70 y.o. MRN: [id number] Date of Office Visit: 04/11/2021  Assessment  Chief Complaint: Asthma (Came in last week for a respiratory infection still has congestion )  HPI Anne Kerr is a 70 year old female who presents today for follow-up of severe persistent asthma with acute exacerbation, acute nonrecurrent maxillary sinusitis, dysfunction of both eustachian tubes, allergy with anaphylaxis due to food, gastroesophageal reflux disease, and allergic rhinitis.  She was last seen on April 04, 2021 by Gareth Morgan, FNP.  Severe persistent asthma with acute exacerbation is reported as doing much better with budesonide 0.5 mg via nebulizer twice a day and DuoNeb every 6 hours.  She finished her prednisone that she was given at her last office visit.  She also continues to receive Tezspire injections once a month.  She reports a productive cough with light yellow sputum.  The sputum used to be thick and yellow.  She also reports some wheezing.  She denies any fever,chills, tightness in her chest, shortness of breath and nocturnal awakenings.  Since her last office visit she has not made any trips to the emergency room or urgent care due to breathing problems.  She has been using her DuoNeb every 6 hours.  Allergic rhinitis with acute sinusitis is reported as doing better.  She has not started cetirizine 10 mg once a day yet and is not using Nasacort nasal spray.  She continues to take Augmentin 875 mg twice a day day and uses azelastine nasal spray as needed.  She reports light yellow rhinorrhea now.    Before her rhinorrhea was thick yellow.  She also reports nasal congestion.She has a slight sore throat and this is much better than what it used to be.  She also used to be hoarse.  Reflux is reported as controlled with omeprazole 20 mg once a day.  She continues  to avoid shellfish without any accidental ingestion or use of her EpiPen.  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   Drug Allergies:  Allergies  Allergen Reactions   Aspirin Other (See Comments)    Wheeze   Ibuprofen Shortness Of Breath, Other (See Comments) and Cough    Wheeze   Other Swelling and Other (See Comments)    Bee Sting Asthma Symptoms Asthma Symptoms   Shellfish Allergy Other (See Comments)    Asthma Symptoms   Bee Venom Swelling   Iodine     ASTHMA SYMPTOMS    Review of Systems: Review of Systems  Constitutional:  Negative for chills and fever.  HENT:         Reports light yellow rhinorrhea, postnasal drip and nasal congestion.  Eyes:  Positive for redness.       Reports slight bit of eye redness and denies pain and itchy watery eyes.  She reports vision changes, but was told that she has a cataract.  She has an appointment with her eye doctor tomorrow  Respiratory:  Positive for cough and wheezing. Negative for shortness of breath.        Reports cough that is productive with light yellow sputum, and some wheezing.  She denies tightness in her chest, shortness of breath, nocturnal awakenings  Cardiovascular:  Negative for chest pain and palpitations.  Gastrointestinal:  Denies heartburn or reflux symptoms  Genitourinary:  Negative for dysuria.  Skin:  Negative for itching and rash.  Neurological:  Negative for headaches.  Endo/Heme/Allergies:  Positive for environmental allergies.    Physical Exam: BP 128/80   Pulse 99   Temp 98 F (36.7 C)   Resp 18   Ht 5\' 4"  (1.626 m)   Wt 196 lb 12.8 oz (89.3 kg)   SpO2 97%   BMI 33.78 kg/m    Physical Exam Constitutional:      Appearance: Normal appearance.  HENT:     Head: Normocephalic and atraumatic.     Comments: Pharynx normal, eyes normal, ears normal, nose: Bilateral lower turbinates mildly edematous with no  drainage noted    Right Ear: Tympanic membrane, ear canal and external ear normal.     Left Ear: Tympanic membrane, ear canal and external ear normal.     Mouth/Throat:     Mouth: Mucous membranes are moist.     Pharynx: Oropharynx is clear.  Eyes:     Conjunctiva/sclera: Conjunctivae normal.  Cardiovascular:     Rate and Rhythm: Regular rhythm.     Heart sounds: Normal heart sounds.  Pulmonary:     Effort: Pulmonary effort is normal.     Breath sounds: Normal breath sounds.     Comments: Lungs clear to auscultation Musculoskeletal:     Cervical back: Neck supple.  Skin:    General: Skin is warm.  Neurological:     Mental Status: She is alert and oriented to person, place, and time.  Psychiatric:        Mood and Affect: Mood normal.        Behavior: Behavior normal.        Thought Content: Thought content normal.        Judgment: Judgment normal.    Diagnostics: FVC 1.75 L, FEV1 1.28 L.  Predicted FVC 2.36 L, predicted FEV1 1.83 L.  Spirometry indicates mild restriction.  Spirometry is consistent with previous spirometry.  Assessment and Plan: 1. Asthma, severe persistent, well-controlled   2. Acute non-recurrent maxillary sinusitis   3. Dysfunction of both eustachian tubes   4. Allergy with anaphylaxis due to food   5. Allergic rhinitis due to other allergic trigger, unspecified seasonality   6. Gastroesophageal reflux disease, unspecified whether esophagitis present     No orders of the defined types were placed in this encounter.   Patient Instructions  Asthma   Stop budesonide 0.5 mg via nebulizer twice a day  Continue DuoNeb via nebulizer once every 6 hours as needed for cough, wheeze, tightness in chest, or shortness of breath Re-start Breztri using 2 puffs twice a day with spacer to help prevent cough and Continue to receive Tezspire injections once a month  Acute sinusitis Continue  Augmentin 875 mg twice a day. We will add on an additonal 7 days of  Augmentin 875 mg taking one tablet twice a day Continue nasal saline rinses  Start Nasacort 1-2 sprays each nostril once a day to help with stuffy nose May use Mucinex [phone] mg twice a day and increase fluid intake to loosen and thin mucus  Allergic rhinitis Continue cetirizine 10 mg once a day as needed for runny nose beginning next week Continue azelastine 2 sprays in each nostril twice a day as needed for runny nose Re-start Nasacort as above Consider saline nasal rinses as needed for nasal symptoms. Use this before any medicated nasal sprays for best result  Reflux  Continue dietary and lifestyle modifications as listed below Continue omeprazole 20 mg once a day to control reflux  Food allergy Continue to avoid shellfish. In case of an allergic reaction, take Benadryl 50 mg  every 4 hours, and if life-threatening symptoms occur, inject with EpiPen 0.3 mg.  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  Return in about 4 weeks (around 05/09/2021), or if symptoms worsen or fail to improve.    Thank you for the opportunity to care for this patient.  Please do not hesitate to contact me with questions.  Althea Charon, FNP [facility] [place] [facility] [place]

## 2021-04-26 ENCOUNTER — Other Ambulatory Visit: Payer: Self-pay | Admitting: Family Medicine

## 2021-05-01 DIAGNOSIS — Z9889 Other specified postprocedural states: Secondary | ICD-10-CM

## 2021-05-01 HISTORY — DX: Other specified postprocedural states: Z98.890

## 2021-05-07 ENCOUNTER — Other Ambulatory Visit: Payer: Self-pay

## 2021-05-07 ENCOUNTER — Ambulatory Visit (INDEPENDENT_AMBULATORY_CARE_PROVIDER_SITE_OTHER): Payer: Federal, State, Local not specified - PPO | Admitting: *Deleted

## 2021-05-07 DIAGNOSIS — J4550 Severe persistent asthma, uncomplicated: Secondary | ICD-10-CM | POA: Diagnosis not present

## 2021-05-13 ENCOUNTER — Ambulatory Visit: Payer: Medicare Other | Admitting: Family

## 2021-06-04 ENCOUNTER — Ambulatory Visit: Payer: Self-pay

## 2021-06-07 ENCOUNTER — Other Ambulatory Visit: Payer: Self-pay

## 2021-06-07 ENCOUNTER — Ambulatory Visit (INDEPENDENT_AMBULATORY_CARE_PROVIDER_SITE_OTHER): Payer: Federal, State, Local not specified - PPO

## 2021-06-07 DIAGNOSIS — J4550 Severe persistent asthma, uncomplicated: Secondary | ICD-10-CM | POA: Diagnosis not present

## 2021-07-05 ENCOUNTER — Ambulatory Visit (INDEPENDENT_AMBULATORY_CARE_PROVIDER_SITE_OTHER): Payer: Federal, State, Local not specified - PPO

## 2021-07-05 ENCOUNTER — Other Ambulatory Visit: Payer: Self-pay

## 2021-07-05 ENCOUNTER — Ambulatory Visit: Payer: Federal, State, Local not specified - PPO

## 2021-07-05 DIAGNOSIS — J4550 Severe persistent asthma, uncomplicated: Secondary | ICD-10-CM | POA: Diagnosis not present

## 2021-08-02 ENCOUNTER — Ambulatory Visit (INDEPENDENT_AMBULATORY_CARE_PROVIDER_SITE_OTHER): Payer: Federal, State, Local not specified - PPO

## 2021-08-02 ENCOUNTER — Other Ambulatory Visit: Payer: Self-pay

## 2021-08-02 DIAGNOSIS — J4550 Severe persistent asthma, uncomplicated: Secondary | ICD-10-CM | POA: Diagnosis not present

## 2021-08-30 ENCOUNTER — Ambulatory Visit: Payer: Medicare Other

## 2021-09-17 ENCOUNTER — Other Ambulatory Visit: Payer: Self-pay

## 2021-09-17 ENCOUNTER — Ambulatory Visit (INDEPENDENT_AMBULATORY_CARE_PROVIDER_SITE_OTHER): Payer: Federal, State, Local not specified - PPO | Admitting: *Deleted

## 2021-09-17 DIAGNOSIS — J4550 Severe persistent asthma, uncomplicated: Secondary | ICD-10-CM

## 2021-10-14 ENCOUNTER — Other Ambulatory Visit: Payer: Self-pay

## 2021-10-14 ENCOUNTER — Ambulatory Visit (INDEPENDENT_AMBULATORY_CARE_PROVIDER_SITE_OTHER): Payer: Federal, State, Local not specified - PPO

## 2021-10-14 DIAGNOSIS — J4550 Severe persistent asthma, uncomplicated: Secondary | ICD-10-CM | POA: Diagnosis not present

## 2021-10-15 ENCOUNTER — Ambulatory Visit: Payer: Medicare Other

## 2021-10-16 ENCOUNTER — Other Ambulatory Visit: Payer: Self-pay | Admitting: Family

## 2021-11-11 ENCOUNTER — Ambulatory Visit (INDEPENDENT_AMBULATORY_CARE_PROVIDER_SITE_OTHER): Payer: Federal, State, Local not specified - PPO

## 2021-11-11 ENCOUNTER — Other Ambulatory Visit: Payer: Self-pay

## 2021-11-11 DIAGNOSIS — J4550 Severe persistent asthma, uncomplicated: Secondary | ICD-10-CM | POA: Diagnosis not present

## 2021-12-08 ENCOUNTER — Other Ambulatory Visit: Payer: Self-pay | Admitting: Allergy and Immunology

## 2021-12-09 ENCOUNTER — Other Ambulatory Visit: Payer: Self-pay | Admitting: Allergy and Immunology

## 2021-12-09 ENCOUNTER — Other Ambulatory Visit: Payer: Self-pay

## 2021-12-09 ENCOUNTER — Ambulatory Visit (INDEPENDENT_AMBULATORY_CARE_PROVIDER_SITE_OTHER): Payer: Federal, State, Local not specified - PPO

## 2021-12-09 DIAGNOSIS — J4550 Severe persistent asthma, uncomplicated: Secondary | ICD-10-CM | POA: Diagnosis not present

## 2022-01-06 ENCOUNTER — Ambulatory Visit (INDEPENDENT_AMBULATORY_CARE_PROVIDER_SITE_OTHER): Payer: Federal, State, Local not specified - PPO

## 2022-01-06 DIAGNOSIS — J4550 Severe persistent asthma, uncomplicated: Secondary | ICD-10-CM | POA: Diagnosis not present

## 2022-02-03 ENCOUNTER — Ambulatory Visit: Payer: Medicare Other

## 2022-02-07 ENCOUNTER — Ambulatory Visit (INDEPENDENT_AMBULATORY_CARE_PROVIDER_SITE_OTHER): Payer: Federal, State, Local not specified - PPO

## 2022-02-07 DIAGNOSIS — J4550 Severe persistent asthma, uncomplicated: Secondary | ICD-10-CM | POA: Diagnosis not present

## 2022-03-07 ENCOUNTER — Ambulatory Visit (INDEPENDENT_AMBULATORY_CARE_PROVIDER_SITE_OTHER): Payer: Federal, State, Local not specified - PPO

## 2022-03-07 DIAGNOSIS — J4550 Severe persistent asthma, uncomplicated: Secondary | ICD-10-CM

## 2022-03-11 ENCOUNTER — Ambulatory Visit: Payer: Federal, State, Local not specified - PPO | Admitting: Allergy and Immunology

## 2022-03-11 ENCOUNTER — Encounter: Payer: Self-pay | Admitting: Allergy and Immunology

## 2022-03-11 VITALS — BP 118/88 | HR 76 | Temp 98.1°F | Resp 16 | Ht 64.0 in | Wt 203.8 lb

## 2022-03-11 DIAGNOSIS — Z8709 Personal history of other diseases of the respiratory system: Secondary | ICD-10-CM

## 2022-03-11 DIAGNOSIS — Z8669 Personal history of other diseases of the nervous system and sense organs: Secondary | ICD-10-CM

## 2022-03-11 DIAGNOSIS — J4550 Severe persistent asthma, uncomplicated: Secondary | ICD-10-CM | POA: Diagnosis not present

## 2022-03-11 DIAGNOSIS — J3089 Other allergic rhinitis: Secondary | ICD-10-CM | POA: Diagnosis not present

## 2022-03-11 DIAGNOSIS — T7800XA Anaphylactic reaction due to unspecified food, initial encounter: Secondary | ICD-10-CM

## 2022-03-11 NOTE — Progress Notes (Unsigned)
[place]   Follow-up Note  Referring Provider: Allyne Gee, MD Primary Provider: Allyne Gee, MD Date of Office Visit: 03/11/2022  Subjective:   Anne Kerr (DOB: 08-17-1951) is a 71 y.o. female who returns to the [facility] on 03/11/2022 in re-evaluation of the following:  HPI: Tim Lair returns to this clinic in evaluation of asthma, allergic rhinitis, chronic sinusitis, chronic mastoiditis, reflux, and food allergy directed against shellfish.  I have not seen her in this clinic since 20 November 2020.  She was seen by our nurse practitioners with her last visit occurring 11 April 2021.  She has really done very well regarding her asthma and her upper airway issue while currently using anti-TSL P antibody and Breztri 1 time per day and Nasacort 1 time per day.  It does not sound as though she is required a systemic steroid or antibiotic for any type of airway issue.  She can exert herself without problem.  She still has fullness in her ears.  She did have a visit with [facility].  Her reflux is doing well.  She does not consume shellfish.  Allergies as of 03/11/2022       Reactions   Aspirin Other (See Comments)   Wheeze   Ibuprofen Shortness Of Breath, Other (See Comments), Cough   Wheeze   Other Swelling, Other (See Comments)   Bee Sting Asthma Symptoms Asthma Symptoms   Shellfish Allergy Other (See Comments)   Asthma Symptoms   Bee Venom Swelling   Iodine    ASTHMA SYMPTOMS        Medication List    acetaminophen 500 MG tablet Commonly known as: TYLENOL Take one to two if needed   albuterol 108 (90 Base) MCG/ACT inhaler Commonly known as: VENTOLIN HFA Inhale 2 puffs into the lungs every 4 (four) hours as needed for wheezing or shortness of breath.   albuterol (2.5 MG/3ML) 0.083% nebulizer solution Commonly known as: PROVENTIL Take 3 mLs (2.5 mg total) by  nebulization every 4 (four) hours as needed for wheezing or shortness of breath.   alendronate 70 MG tablet Commonly known as: FOSAMAX Take by mouth.   ascorbic acid 500 MG tablet Commonly known as: VITAMIN C Take one every day   azelastine 0.1 % nasal spray Commonly known as: ASTELIN USE ONE SPRAY IN EACH NOSTRIL TWICE DAILY X 1 WEEK.   BD Disp Needles 25G X 5/8" Misc Generic drug: NEEDLE (DISP) 25 G USE AS DIRECTED TO ADMINISTER XOLAIR   benzonatate 100 MG capsule Commonly known as: Tessalon Perles Take 1 capsule (100 mg total) by mouth every 6 (six) hours as needed for cough.   Breztri Aerosphere 160-9-4.8 MCG/ACT Aero Generic drug: Budeson-Glycopyrrol-Formoterol Inhale 2 puffs into the lungs in the morning and at bedtime.   CALCIUM 600+D PO Take 1 tablet by mouth daily.   cetirizine 10 MG tablet Commonly known as: ZYRTEC TAKE 1 TABLET BY MOUTH EVERY DAY   EPINEPHrine 0.3 mg/0.3 mL Soaj injection Commonly known as: EPI-PEN epinephrine 0.3 mg/0.3 mL injection, auto-injector   guaiFENesin 600 MG 12 hr tablet Commonly known as: Mucinex Take 1 tablet (600 mg total) by mouth 2 (two) times daily.   ipratropium-albuterol 0.5-2.5 (3) MG/3ML Soln Commonly known as: DUONEB Take 3 mLs by nebulization every 6 (six) hours as needed.   metFORMIN 500 MG tablet Commonly known as: GLUCOPHAGE Take by mouth.   metoprolol  succinate 25 MG 24 hr tablet Commonly known as: TOPROL-XL Take by mouth.   montelukast 10 MG tablet Commonly known as: SINGULAIR TAKE 1 TABLET BY MOUTH EVERYDAY AT BEDTIME   NASACORT AQ NA Place 2 sprays into the nose as needed.   triamcinolone 55 MCG/ACT Aero nasal inhaler Commonly known as: NASACORT Place 2 sprays into the nose daily.   omeprazole 20 MG capsule Commonly known as: PRILOSEC Take by mouth.   predniSONE 50 MG tablet Commonly known as: DELTASONE 1 tab by mouth daily   rivaroxaban 10 MG Tabs tablet Commonly known as:  XARELTO Xarelto 10 mg tablet  TAKE 1 TABLET (10 MG TOTAL) BY MOUTH DAILY WITH BREAKFAST.   rosuvastatin 20 MG tablet Commonly known as: CRESTOR Take by mouth.   UNABLE TO FIND Fluzone High-Dose Quad 2020-21 (PF) 240 mcg/0.7 mL IM syringe  PHARMACY ADMINISTERED   valsartan-hydrochlorothiazide 80-12.5 MG tablet Commonly known as: DIOVAN-HCT Take by mouth.   VITAMIN B-12 PO Take 3,000 mcg by mouth daily.   Vitamin D-3 125 MCG (5000 UT) Tabs Take 5,000 Units by mouth daily.   Xolair 150 MG injection Generic drug: omalizumab RECONSTITUTE VIAL WITH 1.4 ML STERILE WATER . INJECT 1.2 ML UNDER THE SKIN (SUBCUTANEOUS INJECTION) EVERY 4 WEEKS REFRIGERATE    Past Medical History:  Diagnosis Date   Arthritis    Asthma    allergy induced   Complication of anesthesia    hard to wake up   Diabetes mellitus without complication (HCC)    GERD (gastroesophageal reflux disease)    High blood pressure    History of kidney stones    h/o   Hyperlipidemia    Leaky heart valve    Leaky heart valve    Tachycardia    TIA (transient ischemic attack)    no residual side effects-pt states she didnt even know- a doctor told her this   Uterine cancer ([place]) 1989    Past Surgical History:  Procedure Laterality Date   COLONOSCOPY     KNEE ARTHROSCOPY Right    NASAL SINUS SURGERY     x 4   PARTIAL HYSTERECTOMY     SINOSCOPY     TOTAL KNEE ARTHROPLASTY Left 05/11/2019   Procedure: TOTAL KNEE ARTHROPLASTY;  Surgeon: Lovell Sheehan, MD;  Location: [facility] ORS;  Service: Orthopedics;  Laterality: Left;    Review of systems negative except as noted in HPI / PMHx or noted below:  Review of Systems  Constitutional: Negative.   HENT: Negative.    Eyes: Negative.   Respiratory: Negative.    Cardiovascular: Negative.   Gastrointestinal: Negative.   Genitourinary: Negative.   Musculoskeletal: Negative.   Skin: Negative.   Neurological: Negative.   Endo/Heme/Allergies: Negative.    Psychiatric/Behavioral: Negative.       Objective:   Vitals:   03/11/22 0918  BP: 118/88  Pulse: 76  Resp: 16  Temp: 98.1 F (36.7 C)  SpO2: 97%   Height: '5\' 4"'$  (162.6 cm)  Weight: 203 lb 12.8 oz (92.4 kg)   Physical Exam Constitutional:      Appearance: She is not diaphoretic.  HENT:     Head: Normocephalic.     Right Ear: Ear canal and external ear normal. A middle ear effusion is present.     Left Ear: Ear canal and external ear normal. A middle ear effusion is present.     Nose: Nose normal. No mucosal edema or rhinorrhea.     Mouth/Throat:  Pharynx: Uvula midline. No oropharyngeal exudate.  Eyes:     Conjunctiva/sclera: Conjunctivae normal.  Neck:     Thyroid: No thyromegaly.     Trachea: Trachea normal. No tracheal tenderness or tracheal deviation.  Cardiovascular:     Rate and Rhythm: Normal rate and regular rhythm.     Heart sounds: Normal heart sounds, S1 normal and S2 normal. No murmur heard. Pulmonary:     Effort: No respiratory distress.     Breath sounds: Normal breath sounds. No stridor. No wheezing or rales.  Lymphadenopathy:     Head:     Right side of head: No tonsillar adenopathy.     Left side of head: No tonsillar adenopathy.     Cervical: No cervical adenopathy.  Skin:    Findings: No erythema or rash.     Nails: There is no clubbing.  Neurological:     Mental Status: She is alert.     Diagnostics:    Spirometry was performed and demonstrated an FEV1 of 1.46 at 82 % of predicted.  Assessment and Plan:   1. Asthma, severe persistent, well-controlled   2. Other allergic rhinitis   3. History of chronic sinusitis   4. History of mastoiditis   5. Allergy with anaphylaxis due to food     1. Continue Tezepelumab injections every 4 weeks   2. Continue Breztri - 2 inhalations 1-2 times per day with spacer  3. Continue Nasacort one spray each nostril 3-7 times per week   4. Continue omeprazole 20 mg 1 time per day  5. Continue  Proventil HFA and Epi-Pen if needed  6. During "flareup" add Flovent 110 - two inhalation 2 times per day to [facility]  7. Return to clinic in 6 months or earlier if problem  Cecille has really done well using anti-TSL P antibody regarding her airway issue.  She will continue on this biologic as well as low-dose Breztri and Nasacort and also continue to treat her reflux with omeprazole.  She can follow-up with ENT regarding her chronic ear issues.  Of course, she is not going to be consuming shellfish.  Assuming she does well with this plan I will see her back in this clinic in 6 months or earlier if there is a problem.  Allena Katz, MD Allergy / Immunology [place]

## 2022-03-11 NOTE — Patient Instructions (Addendum)
  1. Continue Tezepelumab injections every 4 weeks   2. Continue Breztri - 2 inhalations 1-2 times per day with spacer   3. Continue Nasacort one spray each nostril 3-7 times per week   4. Continue omeprazole 20 mg 1 time per day  5. Continue Proventil HFA and Epi-Pen if needed  6. During "flareup" add Flovent 110 - two inhalation 2 times per day to [facility]  7. Return to clinic in 6 months or earlier if problem

## 2022-03-12 ENCOUNTER — Encounter: Payer: Self-pay | Admitting: Allergy and Immunology

## 2022-04-04 ENCOUNTER — Ambulatory Visit (INDEPENDENT_AMBULATORY_CARE_PROVIDER_SITE_OTHER): Payer: Federal, State, Local not specified - PPO | Admitting: *Deleted

## 2022-04-04 DIAGNOSIS — J4550 Severe persistent asthma, uncomplicated: Secondary | ICD-10-CM

## 2022-05-02 ENCOUNTER — Ambulatory Visit (INDEPENDENT_AMBULATORY_CARE_PROVIDER_SITE_OTHER): Payer: Federal, State, Local not specified - PPO

## 2022-05-02 DIAGNOSIS — J4550 Severe persistent asthma, uncomplicated: Secondary | ICD-10-CM

## 2022-05-30 ENCOUNTER — Ambulatory Visit (INDEPENDENT_AMBULATORY_CARE_PROVIDER_SITE_OTHER): Payer: Federal, State, Local not specified - PPO | Admitting: *Deleted

## 2022-05-30 DIAGNOSIS — J4540 Moderate persistent asthma, uncomplicated: Secondary | ICD-10-CM | POA: Diagnosis not present

## 2022-06-26 ENCOUNTER — Ambulatory Visit (INDEPENDENT_AMBULATORY_CARE_PROVIDER_SITE_OTHER): Payer: Federal, State, Local not specified - PPO

## 2022-06-26 DIAGNOSIS — J4550 Severe persistent asthma, uncomplicated: Secondary | ICD-10-CM | POA: Diagnosis not present

## 2022-06-26 DIAGNOSIS — J4540 Moderate persistent asthma, uncomplicated: Secondary | ICD-10-CM

## 2022-06-27 ENCOUNTER — Ambulatory Visit: Payer: Federal, State, Local not specified - PPO

## 2022-06-27 NOTE — Addendum Note (Signed)
Addended by: Carin Hock on: 06/27/2022 12:04 PM   Modules accepted: Orders

## 2022-07-24 ENCOUNTER — Ambulatory Visit (INDEPENDENT_AMBULATORY_CARE_PROVIDER_SITE_OTHER): Payer: Federal, State, Local not specified - PPO

## 2022-07-24 DIAGNOSIS — J4540 Moderate persistent asthma, uncomplicated: Secondary | ICD-10-CM | POA: Diagnosis not present

## 2022-08-20 ENCOUNTER — Ambulatory Visit (INDEPENDENT_AMBULATORY_CARE_PROVIDER_SITE_OTHER): Payer: Federal, State, Local not specified - PPO | Admitting: *Deleted

## 2022-08-20 DIAGNOSIS — J4540 Moderate persistent asthma, uncomplicated: Secondary | ICD-10-CM

## 2022-08-26 ENCOUNTER — Other Ambulatory Visit: Payer: Self-pay

## 2022-08-26 ENCOUNTER — Encounter: Payer: Self-pay | Admitting: Allergy and Immunology

## 2022-08-26 ENCOUNTER — Ambulatory Visit (INDEPENDENT_AMBULATORY_CARE_PROVIDER_SITE_OTHER): Payer: Federal, State, Local not specified - PPO | Admitting: Allergy and Immunology

## 2022-08-26 VITALS — BP 130/84 | HR 70 | Temp 97.3°F | Resp 16 | Ht 63.0 in | Wt 198.2 lb

## 2022-08-26 DIAGNOSIS — J4550 Severe persistent asthma, uncomplicated: Secondary | ICD-10-CM

## 2022-08-26 DIAGNOSIS — J3089 Other allergic rhinitis: Secondary | ICD-10-CM | POA: Diagnosis not present

## 2022-08-26 DIAGNOSIS — J4551 Severe persistent asthma with (acute) exacerbation: Secondary | ICD-10-CM

## 2022-08-26 DIAGNOSIS — Z91018 Allergy to other foods: Secondary | ICD-10-CM | POA: Diagnosis not present

## 2022-08-26 DIAGNOSIS — K219 Gastro-esophageal reflux disease without esophagitis: Secondary | ICD-10-CM | POA: Diagnosis not present

## 2022-08-26 MED ORDER — MONTELUKAST SODIUM 10 MG PO TABS: 10.0000 mg | ORAL_TABLET | Freq: Every day | ORAL | 1 refills | Status: DC

## 2022-08-26 MED ORDER — OMEPRAZOLE MAGNESIUM 20 MG PO TBEC: 20.0000 mg | DELAYED_RELEASE_TABLET | Freq: Every day | ORAL | 1 refills | Status: DC

## 2022-08-26 MED ORDER — ALBUTEROL SULFATE HFA 108 (90 BASE) MCG/ACT IN AERS: 2.0000 | INHALATION_SPRAY | RESPIRATORY_TRACT | 1 refills | Status: DC | PRN

## 2022-08-26 MED ORDER — METHYLPREDNISOLONE ACETATE 80 MG/ML IJ SUSP
80.0000 mg | Freq: Once | INTRAMUSCULAR | Status: AC
  Administered 2022-08-26: 80 mg via INTRAMUSCULAR

## 2022-08-26 MED ORDER — BREZTRI AEROSPHERE 160-9-4.8 MCG/ACT IN AERO: 2.0000 | INHALATION_SPRAY | Freq: Two times a day (BID) | RESPIRATORY_TRACT | 5 refills | Status: DC

## 2022-08-26 MED ORDER — TRIAMCINOLONE ACETONIDE 55 MCG/ACT NA AERO: 2.0000 | INHALATION_SPRAY | Freq: Every day | NASAL | 5 refills | Status: DC

## 2022-08-26 NOTE — Patient Instructions (Addendum)
  1.  For this recent event utilize the following medications:    A.  DuoNeb nebulization delivered in clinic today  B.  Depo-Medrol 80 IM delivered in clinic today  C.  Prednisone 30 mg delivered in clinic today  D.  Nasal saline multiple times per day  E.  AirSupra -2 inhalations every 4-6 hours (replaces albuterol)  F.   Duoneb - nebulize every 4-6 hours if needed.   2. Continue Tezepelumab injections every 4 weeks   3. Continue Breztri - 2 inhalations 1-2 times per day with spacer   4. Continue Nasacort one spray each nostril 3-7 times per week   5. Continue omeprazole 20 mg 1 time per day  6. Continue Epi-Pen if needed  7. Return to clinic in 6 months or earlier if needed

## 2022-08-26 NOTE — Progress Notes (Signed)
[place]   Follow-up Note  Referring Provider: Allyne Gee, MD Primary Provider: Allyne Gee, MD Date of Office Visit: 08/26/2022  Subjective:   Anne Kerr (DOB: [birth year]) is a 71 y.o. female who returns to the [facility] on 08/26/2022 in re-evaluation of the following:  HPI: Anne Kerr presents to the clinic in evaluation of asthma, allergic rhinitis, chronic sinusitis, chronic mastoiditis, reflux, and food allergy directed against shellfish.  I have not seen her in this clinic since 11 March 2022.  She was really doing very well and had very little complaints about her respiratory tract while she continues to use a collection of anti-inflammatory agents for her airway including the use of anti-TSLP antibody and rarely used any short acting bronchodilator and did not require systemic steroid or antibiotic for any type of airway issue.  Unfortunately, 5 days ago she had acute onset of nasal congestion and rhinorrhea and sneezing and head congestion and ear congestion and coughing and wheezing and she has been using her short acting bronchodilator.  Currently she has no fever or chills or bodyaches and does not have any ugly nasal discharge and does not have any ugly sputum production or chest pain.  She did COVID test herself which was negative.  Allergies as of 08/26/2022       Reactions   Aspirin Other (See Comments)   Wheeze   Ibuprofen Shortness Of Breath, Other (See Comments), Cough   Wheeze   Other Swelling, Other (See Comments)   Bee Sting Asthma Symptoms Asthma Symptoms   Shellfish Allergy Other (See Comments)   Asthma Symptoms   Bee Venom Swelling   Iodine    ASTHMA SYMPTOMS        Medication List    acetaminophen 500 MG tablet Commonly known as: TYLENOL Take one to two if needed   albuterol 108 (90 Base) MCG/ACT inhaler Commonly known as: VENTOLIN HFA Inhale 2 puffs into the lungs  every 4 (four) hours as needed for wheezing or shortness of breath.   albuterol (2.5 MG/3ML) 0.083% nebulizer solution Commonly known as: PROVENTIL Take 3 mLs (2.5 mg total) by nebulization every 4 (four) hours as needed for wheezing or shortness of breath.   alendronate 70 MG tablet Commonly known as: FOSAMAX Take by mouth.   ascorbic acid 500 MG tablet Commonly known as: VITAMIN C Take one every day   azelastine 0.1 % nasal spray Commonly known as: ASTELIN USE ONE SPRAY IN EACH NOSTRIL TWICE DAILY X 1 WEEK.   BD Disp Needles 25G X 5/8" Misc Generic drug: NEEDLE (DISP) 25 G USE AS DIRECTED TO ADMINISTER Butch Penny Aerosphere 160-9-4.8 MCG/ACT Aero Generic drug: Budeson-Glycopyrrol-Formoterol Inhale 2 puffs into the lungs in the morning and at bedtime.   CALCIUM 600+D PO Take 1 tablet by mouth daily.   cetirizine 10 MG tablet Commonly known as: ZYRTEC TAKE 1 TABLET BY MOUTH EVERY DAY   EPINEPHrine 0.3 mg/0.3 mL Soaj injection Commonly known as: EPI-PEN epinephrine 0.3 mg/0.3 mL injection, auto-injector   guaiFENesin 600 MG 12 hr tablet Commonly known as: Mucinex Take 1 tablet (600 mg total) by mouth 2 (two) times daily.   ipratropium-albuterol 0.5-2.5 (3) MG/3ML Soln Commonly known as: DUONEB Take 3 mLs by nebulization every 6 (six) hours as needed.   metFORMIN 500 MG tablet Commonly known as: GLUCOPHAGE Take by mouth.   metoprolol succinate 25 MG 24 hr tablet Commonly  known as: TOPROL-XL Take by mouth.   montelukast 10 MG tablet Commonly known as: SINGULAIR TAKE 1 TABLET BY MOUTH EVERYDAY AT BEDTIME   NASACORT AQ NA Place 2 sprays into the nose as needed.   triamcinolone 55 MCG/ACT Aero nasal inhaler Commonly known as: NASACORT Place 2 sprays into the nose daily.   omeprazole 20 MG capsule Commonly known as: PRILOSEC Take by mouth.   predniSONE 50 MG tablet Commonly known as: DELTASONE 1 tab by mouth daily   rivaroxaban 10 MG Tabs  tablet Commonly known as: XARELTO Xarelto 10 mg tablet  TAKE 1 TABLET (10 MG TOTAL) BY MOUTH DAILY WITH BREAKFAST.   rosuvastatin 20 MG tablet Commonly known as: CRESTOR Take by mouth.   UNABLE TO FIND Fluzone High-Dose Quad 2020-21 (PF) 240 mcg/0.7 mL IM syringe  PHARMACY ADMINISTERED   valsartan-hydrochlorothiazide 80-12.5 MG tablet Commonly known as: DIOVAN-HCT Take by mouth.   VITAMIN B-12 PO Take 3,000 mcg by mouth daily.   Vitamin D-3 125 MCG (5000 UT) Tabs Take 5,000 Units by mouth daily.    Past Medical History:  Diagnosis Date   Arthritis    Asthma    allergy induced   Complication of anesthesia    hard to wake up   Diabetes mellitus without complication (HCC)    GERD (gastroesophageal reflux disease)    High blood pressure    History of kidney stones    h/o   Hyperlipidemia    Leaky heart valve    Leaky heart valve    Tachycardia    TIA (transient ischemic attack)    no residual side effects-pt states she didnt even know- a doctor told her this   Uterine cancer ([place]) 1989    Past Surgical History:  Procedure Laterality Date   COLONOSCOPY     KNEE ARTHROSCOPY Right    NASAL SINUS SURGERY     x 4   PARTIAL HYSTERECTOMY     SINOSCOPY     TOTAL KNEE ARTHROPLASTY Left 05/11/2019   Procedure: TOTAL KNEE ARTHROPLASTY;  Surgeon: Lovell Sheehan, MD;  Location: [facility] ORS;  Service: Orthopedics;  Laterality: Left;    Review of systems negative except as noted in HPI / PMHx or noted below:  Review of Systems  Constitutional: Negative.   HENT: Negative.    Eyes: Negative.   Respiratory: Negative.    Cardiovascular: Negative.   Gastrointestinal: Negative.   Genitourinary: Negative.   Musculoskeletal: Negative.   Skin: Negative.   Neurological: Negative.   Endo/Heme/Allergies: Negative.   Psychiatric/Behavioral: Negative.       Objective:   Vitals:   08/26/22 1041  BP: 130/84  Pulse: 70  Resp: 16  Temp: (!) 97.3 F (36.3 C)  SpO2: 98%    Height: '5\' 3"'$  (160 cm)  Weight: 198 lb 3.2 oz (89.9 kg)   Physical Exam Constitutional:      Appearance: She is not diaphoretic.  HENT:     Head: Normocephalic.     Right Ear: Ear canal and external ear normal. A middle ear effusion is present.     Left Ear: Ear canal and external ear normal. A middle ear effusion is present.     Nose: Mucosal edema present. No rhinorrhea.     Mouth/Throat:     Pharynx: Uvula midline. No oropharyngeal exudate.  Eyes:     Conjunctiva/sclera: Conjunctivae normal.  Neck:     Thyroid: No thyromegaly.     Trachea: Trachea normal. No tracheal tenderness or tracheal  deviation.  Cardiovascular:     Rate and Rhythm: Normal rate and regular rhythm.     Heart sounds: Normal heart sounds, S1 normal and S2 normal. No murmur heard. Pulmonary:     Effort: No respiratory distress.     Breath sounds: No stridor. Wheezing (Bilateral inspiratory / expiratory exam all lung fields) present. No rales.  Lymphadenopathy:     Head:     Right side of head: No tonsillar adenopathy.     Left side of head: No tonsillar adenopathy.     Cervical: No cervical adenopathy.  Skin:    Findings: No erythema or rash.     Nails: There is no clubbing.  Neurological:     Mental Status: She is alert.     Diagnostics: none  Assessment and Plan:   1. Asthma, not well controlled, severe persistent, with acute exacerbation   2. Other allergic rhinitis   3. Food allergy   4. Gastroesophageal reflux disease, unspecified whether esophagitis present     1.  For this recent event utilize the following medications:    A.  DuoNeb nebulization delivered in clinic today  B.  Depo-Medrol 80 IM delivered in clinic today  C.  Prednisone 30 mg delivered in clinic today  D.  Nasal saline multiple times per day  E.  AirSupra -2 inhalations every 4-6 hours (replaces albuterol)  F.   Duoneb - nebulize every 4-6 hours if needed.   2. Continue Tezepelumab injections every 4 weeks   3.  Continue Breztri - 2 inhalations 1-2 times per day with spacer   4. Continue Nasacort one spray each nostril 3-7 times per week   5. Continue omeprazole 20 mg 1 time per day  6. Continue Epi-Pen if needed  7. Return to clinic in 6 months or earlier if needed  Gracious has some form of viral respiratory tract infection that is involving both her upper and lower airway and we will treat her with a collection of anti-inflammatory agents for airway as noted above to address this issue.  Prior to this event she was really doing very well and had very little problems with her airway while continuing on a collection of anti-inflammatory agents including the use of anti-TSLP antibody.  And her reflux was under very good control as well.  Assuming she does well with this plan I will see her back in this clinic in 6 months or earlier if there is a problem.  Allena Katz, MD Allergy / Immunology [place]

## 2022-08-27 ENCOUNTER — Encounter: Payer: Self-pay | Admitting: Allergy and Immunology

## 2022-08-29 ENCOUNTER — Telehealth: Payer: Self-pay | Admitting: Internal Medicine

## 2022-08-29 MED ORDER — IPRATROPIUM-ALBUTEROL 0.5-2.5 (3) MG/3ML IN SOLN: 3.0000 mL | Freq: Four times a day (QID) | RESPIRATORY_TRACT | 1 refills | Status: DC | PRN

## 2022-08-29 MED ORDER — AMOXICILLIN-POT CLAVULANATE 875-125 MG PO TABS: 1.0000 | ORAL_TABLET | Freq: Two times a day (BID) | ORAL | 0 refills | Status: AC

## 2022-08-29 NOTE — Telephone Encounter (Signed)
Patient called this morning stating that she was seen by Dr. Neldon Mc 3 days ago and given a steroid injection as well as prednisone to take at home.  Her rescue inhaler was switched to air supra due to chest tightness and wheezing for asthma flare.    She receives Tezspire injections for her asthma as well as Breztri 2 puffs Bid.   She called this morning to report that she continues to experience chest tightness and congestion.  She is having chills, but no fevers.  She feels mucus production in her, but has been unable to cough it out. She is following her asthma action plan as discussed in clinic.  Plan:  - start Augmentin 875 mg 1 tablet twice daily for the next 10 days.  To be taken with probiotics or live culture yogurt. -If no improvement by Monday, patient will call our clinic for in person follow-up - duonebs refilled per request; to use as instructed

## 2022-08-29 NOTE — Telephone Encounter (Signed)
Pt informed of which pharmacy to pick up at.

## 2022-09-02 ENCOUNTER — Ambulatory Visit: Payer: Medicare Other | Admitting: Allergy and Immunology

## 2022-09-17 ENCOUNTER — Ambulatory Visit: Payer: Medicare Other

## 2022-10-03 ENCOUNTER — Ambulatory Visit (INDEPENDENT_AMBULATORY_CARE_PROVIDER_SITE_OTHER): Payer: Federal, State, Local not specified - PPO

## 2022-10-03 DIAGNOSIS — J4550 Severe persistent asthma, uncomplicated: Secondary | ICD-10-CM | POA: Diagnosis not present

## 2022-10-13 DIAGNOSIS — I340 Nonrheumatic mitral (valve) insufficiency: Secondary | ICD-10-CM | POA: Diagnosis not present

## 2022-10-13 DIAGNOSIS — I2510 Atherosclerotic heart disease of native coronary artery without angina pectoris: Secondary | ICD-10-CM | POA: Diagnosis not present

## 2022-10-13 DIAGNOSIS — R0602 Shortness of breath: Secondary | ICD-10-CM | POA: Diagnosis not present

## 2022-10-13 DIAGNOSIS — I10 Essential (primary) hypertension: Secondary | ICD-10-CM | POA: Diagnosis not present

## 2022-10-31 ENCOUNTER — Ambulatory Visit (INDEPENDENT_AMBULATORY_CARE_PROVIDER_SITE_OTHER): Payer: Federal, State, Local not specified - PPO | Admitting: *Deleted

## 2022-10-31 DIAGNOSIS — J4550 Severe persistent asthma, uncomplicated: Secondary | ICD-10-CM | POA: Diagnosis not present

## 2022-11-03 ENCOUNTER — Other Ambulatory Visit: Payer: Self-pay | Admitting: Family

## 2022-11-03 ENCOUNTER — Encounter: Payer: Self-pay | Admitting: Family

## 2022-11-03 DIAGNOSIS — M859 Disorder of bone density and structure, unspecified: Secondary | ICD-10-CM

## 2022-11-04 ENCOUNTER — Ambulatory Visit (INDEPENDENT_AMBULATORY_CARE_PROVIDER_SITE_OTHER): Payer: Medicare Other

## 2022-11-04 DIAGNOSIS — M859 Disorder of bone density and structure, unspecified: Secondary | ICD-10-CM | POA: Diagnosis not present

## 2022-11-28 ENCOUNTER — Ambulatory Visit (INDEPENDENT_AMBULATORY_CARE_PROVIDER_SITE_OTHER): Payer: Medicare HMO | Admitting: *Deleted

## 2022-11-28 DIAGNOSIS — J4550 Severe persistent asthma, uncomplicated: Secondary | ICD-10-CM | POA: Diagnosis not present

## 2022-12-06 ENCOUNTER — Other Ambulatory Visit: Payer: Self-pay | Admitting: Family

## 2022-12-06 DIAGNOSIS — I10 Essential (primary) hypertension: Secondary | ICD-10-CM

## 2022-12-09 ENCOUNTER — Other Ambulatory Visit: Payer: Self-pay | Admitting: Cardiovascular Disease

## 2022-12-09 DIAGNOSIS — I10 Essential (primary) hypertension: Secondary | ICD-10-CM

## 2022-12-09 DIAGNOSIS — E785 Hyperlipidemia, unspecified: Secondary | ICD-10-CM

## 2022-12-12 ENCOUNTER — Other Ambulatory Visit: Payer: Self-pay

## 2022-12-12 MED ORDER — EMPAGLIFLOZIN 10 MG PO TABS: 10.0000 mg | ORAL_TABLET | Freq: Every day | ORAL | 3 refills | Status: DC

## 2022-12-23 ENCOUNTER — Ambulatory Visit (INDEPENDENT_AMBULATORY_CARE_PROVIDER_SITE_OTHER): Payer: Medicare Other | Admitting: Family

## 2022-12-23 ENCOUNTER — Encounter: Payer: Self-pay | Admitting: Family

## 2022-12-23 VITALS — BP 112/68 | HR 77 | Ht 63.0 in | Wt 192.0 lb

## 2022-12-23 DIAGNOSIS — E559 Vitamin D deficiency, unspecified: Secondary | ICD-10-CM | POA: Diagnosis not present

## 2022-12-23 DIAGNOSIS — J301 Allergic rhinitis due to pollen: Secondary | ICD-10-CM | POA: Diagnosis not present

## 2022-12-23 DIAGNOSIS — E538 Deficiency of other specified B group vitamins: Secondary | ICD-10-CM

## 2022-12-23 DIAGNOSIS — E039 Hypothyroidism, unspecified: Secondary | ICD-10-CM | POA: Diagnosis not present

## 2022-12-23 DIAGNOSIS — R5383 Other fatigue: Secondary | ICD-10-CM | POA: Diagnosis not present

## 2022-12-23 DIAGNOSIS — E1165 Type 2 diabetes mellitus with hyperglycemia: Secondary | ICD-10-CM

## 2022-12-23 DIAGNOSIS — E782 Mixed hyperlipidemia: Secondary | ICD-10-CM | POA: Diagnosis not present

## 2022-12-23 DIAGNOSIS — I10 Essential (primary) hypertension: Secondary | ICD-10-CM

## 2022-12-23 LAB — POCT CBG (FASTING - GLUCOSE)-MANUAL ENTRY: Glucose Fasting, POC: 123 mg/dL — AB (ref 70–99)

## 2022-12-23 LAB — POC CREATINE & ALBUMIN,URINE
Albumin/Creatinine Ratio, Urine, POC: <30
Creatinine, POC: 100 mg/dL
Microalbumin Ur, POC: 10 mg/L

## 2022-12-23 MED ORDER — FEXOFENADINE HCL 180 MG PO TABS: 180.0000 mg | ORAL_TABLET | Freq: Every day | ORAL | 1 refills | Status: DC

## 2022-12-23 NOTE — Progress Notes (Signed)
Established Patient Office Visit  Subjective:  Patient ID: Anne Kerr, female    DOB: 07-22-1951  Age: 72 y.o. MRN: [id number]  Chief Complaint  Patient presents with   Follow-up    3 month follow up    Patient is here today for her 3 months follow up.  She has been feeling well since last appointment.   She does have additional concerns to discuss today.  Her allergies have started to act up and she reports that she is having sinus congestion, sneezing, runny nose.   Labs are due today. She needs refills.   I have reviewed her active problem list, medication list, allergies, health maintenance, notes from last encounter, lab results for her appointment today.  No other concerns at this time.   Past Medical History:  Diagnosis Date   Arthritis    Asthma    allergy induced   Complication of anesthesia    hard to wake up   Diabetes mellitus without complication (HCC)    GERD (gastroesophageal reflux disease)    High blood pressure    History of ear surgery 05/01/2021   History of kidney stones    h/o   Hyperlipidemia    Leaky heart valve    Leaky heart valve    Pain 07/09/2016   Pain in left knee 04/27/2019   Tachycardia    TIA (transient ischemic attack)    no residual side effects-pt states she didnt even know- a doctor told her this   Uterine cancer ([place]) 1989    Past Surgical History:  Procedure Laterality Date   COLONOSCOPY     KNEE ARTHROSCOPY Right    NASAL SINUS SURGERY     x 4   PARTIAL HYSTERECTOMY     SINOSCOPY     TOTAL KNEE ARTHROPLASTY Left 05/11/2019   Procedure: TOTAL KNEE ARTHROPLASTY;  Surgeon: Lovell Sheehan, MD;  Location: [facility] ORS;  Service: Orthopedics;  Laterality: Left;    Social History   Socioeconomic History   Marital status: Married    Spouse name: Not on file   Number of children: Not on file   Years of education: Not on file   Highest education level: Not on file  Occupational History   Not on file  Tobacco Use    Smoking status: Never   Smokeless tobacco: Never  Vaping Use   Vaping Use: Never used  Substance and Sexual Activity   Alcohol use: No    Alcohol/week: 0.0 standard drinks of alcohol   Drug use: No   Sexual activity: Not on file  Other Topics Concern   Not on file  Social History Narrative   Not on file   Social Determinants of Health   Financial Resource Strain: Not on file  Food Insecurity: Not on file  Transportation Needs: Not on file  Physical Activity: Not on file  Stress: Not on file  Social Connections: Not on file  Intimate Partner Violence: Not on file    Family History  Problem Relation Age of Onset   Allergic rhinitis Neg Hx    Angioedema Neg Hx    Asthma Neg Hx    Atopy Neg Hx    Eczema Neg Hx    Immunodeficiency Neg Hx    Urticaria Neg Hx     Allergies  Allergen Reactions   Aspirin Other (See Comments)    Wheeze  Wheeze  Wheeze  Wheeze  GI irritation  Wheeze, Wheeze, Wheeze   Ibuprofen Cough,  Other (See Comments), Shortness Of Breath and Itching    Wheeze  Wheeze  Wheeze  Wheeze  Bronchial tubes swelled up  Wheeze, Wheeze, Wheeze   Other Other (See Comments) and Swelling    Bee Sting  Asthma Symptoms  Bee Sting  Bee Sting  Asthma Symptoms  Asthma Symptoms   Shellfish Allergy Other (See Comments)    Asthma Symptoms   Shellfish-Derived Products Other (See Comments)    Asthma Symptoms  Swelling of bronchial tubes   Bee Venom Swelling   Iodine Other (See Comments)    ASTHMA SYMPTOMS  ASTHMA SYMPTOMS  ASTHMA SYMPTOMS  ASTHMA SYMPTOMS, ASTHMA SYMPTOMS   Lisinopril     Review of Systems  HENT:  Positive for congestion.   All other systems reviewed and are negative.      Objective:   BP 112/68   Pulse 77   Ht 5\' 3"  (1.6 m)   Wt 192 lb (87.1 kg)   SpO2 96%   BMI 34.01 kg/m   Vitals:   12/23/22 0928  BP: 112/68  Pulse: 77  Height: 5\' 3"  (1.6 m)  Weight: 192 lb (87.1 kg)  SpO2: 96%  BMI (Calculated):  34.02    Physical Exam Vitals and nursing note reviewed.  Constitutional:      Appearance: Normal appearance. She is normal weight.  HENT:     Head: Normocephalic.     Nose: Rhinorrhea present.  Eyes:     Pupils: Pupils are equal, round, and reactive to light.  Cardiovascular:     Rate and Rhythm: Normal rate.  Pulmonary:     Effort: Pulmonary effort is normal.  Neurological:     Mental Status: She is alert.      Results for orders placed or performed in visit on 12/23/22  POCT CBG (Fasting - Glucose)  Result Value Ref Range   Glucose Fasting, POC 123 (A) 70 - 99 mg/dL  POC CREATINE & ALBUMIN,URINE  Result Value Ref Range   Microalbumin Ur, POC 10 mg/L   Creatinine, POC 100 mg/dL   Albumin/Creatinine Ratio, Urine, POC <30     Recent Results (from the past 2160 hour(s))  POCT CBG (Fasting - Glucose)     Status: Abnormal   Collection Time: 12/23/22  9:31 AM  Result Value Ref Range   Glucose Fasting, POC 123 (A) 70 - 99 mg/dL  POC CREATINE & ALBUMIN,URINE     Status: Normal   Collection Time: 12/23/22 10:17 AM  Result Value Ref Range   Microalbumin Ur, POC 10 mg/L   Creatinine, POC 100 mg/dL   Albumin/Creatinine Ratio, Urine, POC <30       Assessment & Plan:   Problem List Items Addressed This Visit     Allergic rhinitis    Sending fexofenadine for pt.  Continue flonase as well.       Type 2 diabetes mellitus with hyperglycemia ([place]) - Primary    Labs ordered today - will send refills for her.  Microalbumin checked in office today.       Relevant Orders   POCT CBG (Fasting - Glucose) (Completed)   CBC With Differential   CMP14+EGFR   Hemoglobin A1c   POC CREATINE & ALBUMIN,URINE (Completed)   Other Visit Diagnoses     B12 deficiency due to diet       Relevant Orders   CBC With Differential   CMP14+EGFR   Vitamin B12   Vitamin D deficiency, unspecified       Relevant  Orders   VITAMIN D 25 Hydroxy (Vit-D Deficiency, Fractures)   CBC With  Differential   CMP14+EGFR   Mixed hyperlipidemia       Relevant Orders   Lipid panel   CBC With Differential   CMP14+EGFR   Hypothyroidism (acquired)       Relevant Orders   CBC With Differential   CMP14+EGFR   TSH   Essential hypertension, benign       Relevant Orders   CBC With Differential   CMP14+EGFR   Other fatigue       Relevant Orders   CBC With Differential   CMP14+EGFR   TSH       Return in about 5 months (around 05/25/2023) for AWV, F/U.   Total time spent: 30 minutes  Mechele Claude, FNP  12/23/2022

## 2022-12-23 NOTE — Assessment & Plan Note (Signed)
Labs ordered today - will send refills for her.  Microalbumin checked in office today.

## 2022-12-23 NOTE — Assessment & Plan Note (Signed)
>>  ASSESSMENT AND PLAN FOR ALLERGIC RHINITIS WRITTEN ON 12/23/2022  8:18 PM BY Miki Kins, FNP  Sending fexofenadine for pt.  Continue flonase as well.

## 2022-12-23 NOTE — Assessment & Plan Note (Signed)
Sending fexofenadine for pt.  Continue flonase as well.

## 2022-12-25 ENCOUNTER — Ambulatory Visit (INDEPENDENT_AMBULATORY_CARE_PROVIDER_SITE_OTHER): Payer: Medicare HMO

## 2022-12-25 DIAGNOSIS — J4550 Severe persistent asthma, uncomplicated: Secondary | ICD-10-CM

## 2023-01-12 ENCOUNTER — Ambulatory Visit (INDEPENDENT_AMBULATORY_CARE_PROVIDER_SITE_OTHER): Payer: Medicare Other | Admitting: Cardiovascular Disease

## 2023-01-12 ENCOUNTER — Encounter: Payer: Self-pay | Admitting: Cardiovascular Disease

## 2023-01-12 VITALS — BP 124/72 | HR 87 | Ht 63.0 in | Wt 191.0 lb

## 2023-01-12 DIAGNOSIS — Z794 Long term (current) use of insulin: Secondary | ICD-10-CM

## 2023-01-12 DIAGNOSIS — E782 Mixed hyperlipidemia: Secondary | ICD-10-CM | POA: Diagnosis not present

## 2023-01-12 DIAGNOSIS — R42 Dizziness and giddiness: Secondary | ICD-10-CM

## 2023-01-12 DIAGNOSIS — I340 Nonrheumatic mitral (valve) insufficiency: Secondary | ICD-10-CM | POA: Diagnosis not present

## 2023-01-12 DIAGNOSIS — E1165 Type 2 diabetes mellitus with hyperglycemia: Secondary | ICD-10-CM | POA: Diagnosis not present

## 2023-01-12 DIAGNOSIS — I2585 Chronic coronary microvascular dysfunction: Secondary | ICD-10-CM

## 2023-01-12 NOTE — Progress Notes (Signed)
Cardiology Office Note   Date:  01/12/2023   ID:  Anne Kerr, DOB [birth year], MRN [id number]  PCP:  Miki Kins, FNP  Cardiologist:  Adrian Blackwater, MD      History of Present Illness: Anne Kerr is a 72 y.o. female who presents for  Chief Complaint  Patient presents with   Follow-up    3 months follow up    Feeling better  Dizziness This is a chronic problem. The current episode started more than 1 year ago. The problem has been resolved.      Past Medical History:  Diagnosis Date   Arthritis    Asthma    allergy induced   Complication of anesthesia    hard to wake up   Diabetes mellitus without complication    GERD (gastroesophageal reflux disease)    High blood pressure    History of ear surgery 05/01/2021   History of kidney stones    h/o   Hyperlipidemia    Leaky heart valve    Leaky heart valve    Pain 07/09/2016   Pain in left knee 04/27/2019   Tachycardia    TIA (transient ischemic attack)    no residual side effects-pt states she didnt even know- a doctor told her this   Uterine cancer 1989     Past Surgical History:  Procedure Laterality Date   COLONOSCOPY     KNEE ARTHROSCOPY Right    NASAL SINUS SURGERY     x 4   PARTIAL HYSTERECTOMY     SINOSCOPY     TOTAL KNEE ARTHROPLASTY Left 05/11/2019   Procedure: TOTAL KNEE ARTHROPLASTY;  Surgeon: Lyndle Herrlich, MD;  Location: [facility] ORS;  Service: Orthopedics;  Laterality: Left;     Current Outpatient Medications  Medication Sig Dispense Refill   acetaminophen (TYLENOL) 500 MG tablet Take one to two if needed     albuterol (PROVENTIL) (2.5 MG/3ML) 0.083% nebulizer solution Take 3 mLs (2.5 mg total) by nebulization every 4 (four) hours as needed for wheezing or shortness of breath. 75 mL 1   albuterol (VENTOLIN HFA) 108 (90 Base) MCG/ACT inhaler Inhale 2 puffs into the lungs every 4 (four) hours as needed for wheezing or shortness of breath. 18 g 1   alendronate (FOSAMAX) 70 MG  tablet Take by mouth.     ascorbic acid (VITAMIN C) 500 MG tablet Take one every day     azelastine (ASTELIN) 0.1 % nasal spray USE ONE SPRAY IN EACH NOSTRIL TWICE DAILY X 1 WEEK.     BD DISP NEEDLES 25G X 5/8" MISC USE AS DIRECTED TO ADMINISTER XOLAIR 3 each 11   Budeson-Glycopyrrol-Formoterol (BREZTRI AEROSPHERE) 160-9-4.8 MCG/ACT AERO Inhale 2 puffs into the lungs in the morning and at bedtime. 10.7 g 5   Calcium Carbonate-Vitamin D (CALCIUM 600+D PO) Take 1 tablet by mouth daily.     cetirizine (ZYRTEC) 10 MG tablet TAKE 1 TABLET BY MOUTH EVERY DAY 90 tablet 1   Cholecalciferol (VITAMIN D-3) 125 MCG (5000 UT) TABS Take 5,000 Units by mouth daily.      Cyanocobalamin (VITAMIN B-12 PO) Take 3,000 mcg by mouth daily.      empagliflozin (JARDIANCE) 10 MG TABS tablet Take 1 tablet (10 mg total) by mouth daily. 30 tablet 3   EPINEPHrine 0.3 mg/0.3 mL IJ SOAJ injection epinephrine 0.3 mg/0.3 mL injection, auto-injector     fexofenadine (ALLEGRA) 180 MG tablet Take 1 tablet (180 mg total) by mouth  daily. 90 tablet 1   ipratropium-albuterol (DUONEB) 0.5-2.5 (3) MG/3ML SOLN Take 3 mLs by nebulization every 6 (six) hours as needed. 100 mL 1   metFORMIN (GLUCOPHAGE) 500 MG tablet Take by mouth.     metoprolol succinate (TOPROL-XL) 50 MG 24 hr tablet TAKE 1 TABLET BY MOUTH EVERY DAY 90 tablet 0   montelukast (SINGULAIR) 10 MG tablet Take 1 tablet (10 mg total) by mouth at bedtime. 90 tablet 1   omeprazole (PRILOSEC OTC) 20 MG tablet Take 1 tablet (20 mg total) by mouth daily. 90 tablet 1   rivaroxaban (XARELTO) 10 MG TABS tablet Xarelto 10 mg tablet  TAKE 1 TABLET (10 MG TOTAL) BY MOUTH DAILY WITH BREAKFAST.     rosuvastatin (CRESTOR) 20 MG tablet TAKE 1 TABLET BY MOUTH EVERY DAY 90 tablet 0   triamcinolone (NASACORT) 55 MCG/ACT AERO nasal inhaler Place 2 sprays into the nose daily. 16.5 g 5   Triamcinolone Acetonide (NASACORT AQ NA) Place 2 sprays into the nose as needed.      UNABLE TO FIND Fluzone  High-Dose Quad 2020-21 (PF) 240 mcg/0.7 mL IM syringe  PHARMACY ADMINISTERED     valsartan-hydrochlorothiazide (DIOVAN-HCT) 80-12.5 MG tablet TAKE 1 TABLET BY MOUTH EVERY DAY 90 tablet 1   Current Facility-Administered Medications  Medication Dose Route Frequency Provider Last Rate Last Admin   tezepelumab-ekko (TEZSPIRE) 210 MG/1. syringe 210 mg  210 mg Subcutaneous Q28 days Jessica Priest, MD   210 mg at 12/25/22 1445    Allergies:   Aspirin, Ibuprofen, Other, Shellfish allergy, Shellfish-derived products, Bee venom, Iodine, and Lisinopril    Social History:   reports that she has never smoked. She has never used smokeless tobacco. She reports that she does not drink alcohol and does not use drugs.   Family History:  family history is not on file.    ROS:     Review of Systems  Constitutional: Negative.   HENT: Negative.    Eyes: Negative.   Respiratory: Negative.    Gastrointestinal: Negative.   Genitourinary: Negative.   Musculoskeletal: Negative.   Skin: Negative.   Neurological:  Positive for dizziness.  Endo/Heme/Allergies: Negative.   Psychiatric/Behavioral: Negative.    All other systems reviewed and are negative.     All other systems are reviewed and negative.    PHYSICAL EXAM: VS:  BP 124/72   Pulse 87   Ht 5\' 3"  (1.6 m)   Wt 191 lb (86.6 kg)   SpO2 90%   BMI 33.83 kg/m  , BMI Body mass index is 33.83 kg/m. Last weight:  Wt Readings from Last 3 Encounters:  01/12/23 191 lb (86.6 kg)  12/23/22 192 lb (87.1 kg)  08/26/22 198 lb 3.2 oz (89.9 kg)     Physical Exam Constitutional:      Appearance: Normal appearance.  Cardiovascular:     Rate and Rhythm: Normal rate and regular rhythm.     Heart sounds: Normal heart sounds.  Pulmonary:     Effort: Pulmonary effort is normal.     Breath sounds: Normal breath sounds.  Musculoskeletal:     Right lower leg: No edema.     Left lower leg: No edema.  Neurological:     Mental Status: She is alert.        EKG:   Recent Labs: No results found for requested labs within last 365 days.    Lipid Panel No results found for: "CHOL", "TRIG", "HDL", "CHOLHDL", "VLDL", "LDLCALC", "LDLDIRECT"  REASON FOR VISIT  Referred by Dr.Meleena Munroe Welton Flakes.        TESTS  Imaging: Computed Tomographic Angiography:  Cardiac multidetector CT was performed paying particular attention to the coronary arteries for the diagnosis of: Abnormal result of other cardiovascular function study. Diagnostic Drugs:  Administered iohexol (Omnipaque) through an antecubital vein and images from the examination were analyzed for the presence and extent of coronary artery disease, using 3D image processing software. 100 mL of non-ionic contrast (Omnipaque) was used.        TEST CONCLUSIONS  Quality of study: Excellent.  1-Caclium score: 16.2  2-Right dominanat system.  3-Minor luminor irregularities in Mid LAD. RCA and LCX are normal.      Adrian Blackwater MD  Electronically signed by: Adrian Blackwater     Date: 01/26/2020 13:44 REASON FOR VISIT  Visit for: Echocardiogram/R06.02  Sex:   Female   wt= 198   lbs.  BP=110/68  Height= 64   inches.        TESTS  Imaging: Echocardiogram:  An echocardiogram in (2-d) mode was performed and in Doppler mode with color flow velocity mapping was performed. The aortic valve cusps are abnormal 1.9  cm, flow velocity 1.20  m/s, and systolic calculated mean flow gradient 3  mmHg. Mitral valve diastolic peak flow velocity E .879   m/s and E/A ratio 1.1. Aortic root diameter 3.3   cm. The LVOT internal diameter 2.8 cm and flow velocity was abnormal .955   m/s. LV systolic dimension 1.42   cm, diastolic 3.9   cm, posterior wall thickness 1.04   cm, fractional shortening 63.6 %, and EF 92%. IVS thickness 0.998   cm. LA dimension 3.7 cm. Mitral Valve has Trace Regurgitation. Tricuspid Valve has Trace Regurgitation.     ASSESSMENT  Technically adequate study.  Normal  chamber sizes.  Normal left ventricular systolic function.  Normal right ventricular systolic function.  Normal right ventricular diastolic function.  Normal left ventricular wall motion.  Normal right ventricular wall motion.  Trace  tricuspid regurgitation.  Normal pulmonary artery pressure.  Trace mitral regurgitation.  No pericardial effusion.  Mild LVH.     THERAPY   Referring physician: Laurier Nancy  Sonographer: Adrian Blackwater.      Adrian Blackwater MD  Electronically signed by: Adrian Blackwater     Date: 05/08/2022 14:04 Other studies Reviewed: Additional studies/ records that were reviewed today include:  Review of the above records demonstrates:       No data to display            ASSESSMENT AND PLAN:    ICD-10-CM   1. Type 2 diabetes mellitus with hyperglycemia, with long-term current use of insulin  E11.65    Z79.4     2. Dizziness  R42    has resolved    3. Mixed hyperlipidemia  E78.2     4. Nonrheumatic mitral valve regurgitation  I34.0     5. Chronic coronary microvascular dysfunction  I25.85    stable       Problem List Items Addressed This Visit       Endocrine   Type 2 diabetes mellitus with hyperglycemia - Primary   Other Visit Diagnoses     Dizziness       has resolved   Mixed hyperlipidemia       Nonrheumatic mitral valve regurgitation       Chronic coronary microvascular dysfunction       stable  Disposition:   Return in about 3 months (around 04/13/2023).    Total time spent: 30 minutes  Signed,  Adrian Blackwater, MD  01/12/2023 9:19 AM    [facility]

## 2023-01-19 ENCOUNTER — Other Ambulatory Visit: Payer: Self-pay | Admitting: Cardiovascular Disease

## 2023-01-19 ENCOUNTER — Other Ambulatory Visit: Payer: Self-pay | Admitting: Allergy and Immunology

## 2023-01-19 ENCOUNTER — Other Ambulatory Visit: Payer: Self-pay | Admitting: Internal Medicine

## 2023-01-19 DIAGNOSIS — E785 Hyperlipidemia, unspecified: Secondary | ICD-10-CM

## 2023-01-19 DIAGNOSIS — E1165 Type 2 diabetes mellitus with hyperglycemia: Secondary | ICD-10-CM

## 2023-01-23 ENCOUNTER — Ambulatory Visit (INDEPENDENT_AMBULATORY_CARE_PROVIDER_SITE_OTHER): Payer: Medicare HMO

## 2023-01-23 DIAGNOSIS — J4550 Severe persistent asthma, uncomplicated: Secondary | ICD-10-CM

## 2023-02-17 ENCOUNTER — Other Ambulatory Visit: Payer: Self-pay

## 2023-02-17 ENCOUNTER — Ambulatory Visit: Payer: Medicare HMO | Admitting: Allergy and Immunology

## 2023-02-17 ENCOUNTER — Encounter: Payer: Self-pay | Admitting: Allergy and Immunology

## 2023-02-17 VITALS — BP 110/60 | HR 73 | Temp 97.6°F | Resp 16

## 2023-02-17 DIAGNOSIS — J324 Chronic pansinusitis: Secondary | ICD-10-CM | POA: Diagnosis not present

## 2023-02-17 DIAGNOSIS — Z8669 Personal history of other diseases of the nervous system and sense organs: Secondary | ICD-10-CM | POA: Diagnosis not present

## 2023-02-17 DIAGNOSIS — J3089 Other allergic rhinitis: Secondary | ICD-10-CM | POA: Diagnosis not present

## 2023-02-17 DIAGNOSIS — Z91018 Allergy to other foods: Secondary | ICD-10-CM

## 2023-02-17 DIAGNOSIS — J4550 Severe persistent asthma, uncomplicated: Secondary | ICD-10-CM

## 2023-02-17 DIAGNOSIS — K219 Gastro-esophageal reflux disease without esophagitis: Secondary | ICD-10-CM

## 2023-02-17 MED ORDER — TRIAMCINOLONE ACETONIDE 55 MCG/ACT NA AERO: 2.0000 | INHALATION_SPRAY | Freq: Every day | NASAL | 5 refills | Status: DC

## 2023-02-17 MED ORDER — IPRATROPIUM-ALBUTEROL 0.5-2.5 (3) MG/3ML IN SOLN: 3.0000 mL | Freq: Four times a day (QID) | RESPIRATORY_TRACT | 1 refills | Status: DC | PRN

## 2023-02-17 MED ORDER — OMEPRAZOLE MAGNESIUM 20 MG PO TBEC: 20.0000 mg | DELAYED_RELEASE_TABLET | Freq: Every day | ORAL | 5 refills | Status: DC

## 2023-02-17 MED ORDER — BREZTRI AEROSPHERE 160-9-4.8 MCG/ACT IN AERO: 2.0000 | INHALATION_SPRAY | Freq: Two times a day (BID) | RESPIRATORY_TRACT | 5 refills | Status: DC

## 2023-02-17 MED ORDER — EPINEPHRINE 0.3 MG/0.3ML IJ SOAJ: 0.3000 mg | INTRAMUSCULAR | 1 refills | Status: DC | PRN

## 2023-02-17 MED ORDER — AIRSUPRA 90-80 MCG/ACT IN AERO: 2.0000 | INHALATION_SPRAY | RESPIRATORY_TRACT | 1 refills | Status: DC | PRN

## 2023-02-17 NOTE — Progress Notes (Signed)
[place] - [name] [facility] [place]   Follow-up Note  Referring Provider: Yevonne Pax, MD Primary Provider: Miki Kins, FNP Date of Office Visit: 02/17/2023  Subjective:   Anne Kerr (DOB: Aug 29, 1951) is a 72 y.o. female who [facility] in re-evaluation of the following:  HPI: Anne Kerr present to this clinic in evaluation of asthma, allergic rhinitis, chronic sinusitis, and chronic mastoiditis, reflux, and food allergy directed against shellfish.  I last saw her in this clinic 26 August 2022.  During her last visit she appeared to have a acute infection most likely of viral etiology and we treated her with an anti-inflammatory agent for her airway and she resolved that issue fortunately.  For the most part her asthma has been under excellent control and she rarely uses a short acting bronchodilator and she has not required a systemic steroid to treat an exacerbation.  She still gets some intermittent congestion of her upper airway and her ear and she actually uses the DuoNeb to help loosen up that material so that it can drain.  She has not used a antihistamine to treat an episode of sinusitis since her last visit.  Her ability to smell is still diminished but occasionally she can smell some food.  She has been consistently using tezepelumab and has been using her triple inhaler about 1 time per day and uses a nasal steroid twice a day.  She has had no problems with her reflux while using omeprazole.  She does not consume shellfish.  She did have some [place] last month that had shellfish contained within it and just with 1 bite she started to develop problems with her throat and she took Benadryl.  Allergies as of 02/17/2023       Reactions   Aspirin Other (See Comments)   Wheeze Wheeze  Wheeze  Wheeze GI irritation Wheeze, Wheeze, Wheeze   Ibuprofen Cough, Other (See Comments), Shortness Of  Breath, Itching   Wheeze Wheeze  Wheeze  Wheeze Bronchial tubes swelled up Wheeze, Wheeze, Wheeze   Other Other (See Comments), Swelling   Bee Sting Asthma Symptoms Bee Sting  Bee Sting  Asthma Symptoms  Asthma Symptoms   Shellfish Allergy Other (See Comments)   Asthma Symptoms   Shellfish-derived Products Other (See Comments)   Asthma Symptoms Swelling of bronchial tubes   Bee Venom Swelling   Iodine Other (See Comments)   ASTHMA SYMPTOMS ASTHMA SYMPTOMS  ASTHMA SYMPTOMS ASTHMA SYMPTOMS, ASTHMA SYMPTOMS   Lisinopril         Medication List    acetaminophen 500 MG tablet Commonly known as: TYLENOL Take one to two if needed   albuterol (2.5 MG/3ML) 0.083% nebulizer solution Commonly known as: PROVENTIL Take 3 mLs (2.5 mg total) by nebulization every 4 (four) hours as needed for wheezing or shortness of breath.   albuterol 108 (90 Base) MCG/ACT inhaler Commonly known as: VENTOLIN HFA Inhale 2 puffs into the lungs every 4 (four) hours as needed for wheezing or shortness of breath.   alendronate 70 MG tablet Commonly known as: FOSAMAX Take by mouth.   amLODipine 5 MG tablet Commonly known as: NORVASC Take by mouth.   ascorbic acid 500 MG tablet Commonly known as: VITAMIN C Take one every day   azelastine 0.1 % nasal spray Commonly known as: ASTELIN USE ONE SPRAY IN EACH NOSTRIL TWICE DAILY X 1 WEEK.   BD Disp Needles 25G X 5/8"  Misc Generic drug: NEEDLE (DISP) 25 G USE AS DIRECTED TO ADMINISTER Anne Kerr Aerosphere 160-9-4.8 MCG/ACT Aero Generic drug: Budeson-Glycopyrrol-Formoterol Inhale 2 puffs into the lungs in the morning and at bedtime.   CALCIUM 600+D PO Take 1 tablet by mouth daily.   cetirizine 10 MG tablet Commonly known as: ZYRTEC TAKE 1 TABLET BY MOUTH EVERY DAY   Coenzyme Q10 100 MG capsule Take by mouth.   empagliflozin 10 MG Tabs tablet Commonly known as: Jardiance Take 1 tablet (10 mg total) by mouth daily.    EPINEPHrine 0.3 mg/0.3 mL Soaj injection Commonly known as: EPI-PEN epinephrine 0.3 mg/0.3 mL injection, auto-injector   fexofenadine 180 MG tablet Commonly known as: ALLEGRA Take 1 tablet (180 mg total) by mouth daily.   ipratropium-albuterol 0.5-2.5 (3) MG/3ML Soln Commonly known as: DUONEB Take 3 mLs by nebulization every 6 (six) hours as needed.   metFORMIN 500 MG tablet Commonly known as: GLUCOPHAGE TAKE 1 TABLET BY MOUTH TWICE A DAY   metoprolol succinate 50 MG 24 hr tablet Commonly known as: TOPROL-XL TAKE 1 TABLET BY MOUTH EVERY DAY   montelukast 10 MG tablet Commonly known as: SINGULAIR TAKE 1 TABLET BY MOUTH EVERYDAY AT BEDTIME   NASACORT AQ NA Place 2 sprays into the nose as needed.   triamcinolone 55 MCG/ACT Aero nasal inhaler Commonly known as: NASACORT Place 2 sprays into the nose daily.   omeprazole 20 MG tablet Commonly known as: PriLOSEC OTC Take 1 tablet (20 mg total) by mouth daily.   rivaroxaban 10 MG Tabs tablet Commonly known as: XARELTO Xarelto 10 mg tablet  TAKE 1 TABLET (10 MG TOTAL) BY MOUTH DAILY WITH BREAKFAST.   rosuvastatin 20 MG tablet Commonly known as: CRESTOR TAKE 1 TABLET BY MOUTH EVERY DAY   UNABLE TO FIND Fluzone High-Dose Quad 2020-21 (PF) 240 mcg/0.7 mL IM syringe  PHARMACY ADMINISTERED   valsartan-hydrochlorothiazide 80-12.5 MG tablet Commonly known as: DIOVAN-HCT TAKE 1 TABLET BY MOUTH EVERY DAY   VITAMIN B-12 PO Take 3,000 mcg by mouth daily.   Vitamin D-3 125 MCG (5000 UT) Tabs Take 5,000 Units by mouth daily.    Past Medical History:  Diagnosis Date  . Arthritis   . Asthma    allergy induced  . Complication of anesthesia    hard to wake up  . Diabetes mellitus without complication (HCC)   . GERD (gastroesophageal reflux disease)   . High blood pressure   . History of ear surgery 05/01/2021  . History of kidney stones    h/o  . Hyperlipidemia   . Leaky heart valve   . Leaky heart valve   . Pain  07/09/2016  . Pain in left knee 04/27/2019  . Tachycardia   . TIA (transient ischemic attack)    no residual side effects-pt states she didnt even know- a doctor told her this  . Uterine cancer (HCC) 1989    Past Surgical History:  Procedure Laterality Date  . COLONOSCOPY    . KNEE ARTHROSCOPY Right   . NASAL SINUS SURGERY     x 4  . PARTIAL HYSTERECTOMY    . SINOSCOPY    . TOTAL KNEE ARTHROPLASTY Left 05/11/2019   Procedure: TOTAL KNEE ARTHROPLASTY;  Surgeon: Lyndle Herrlich, MD;  Location: [facility] ORS;  Service: Orthopedics;  Laterality: Left;    Review of systems negative except as noted in HPI / PMHx or noted below:  Review of Systems  Constitutional: Negative.   HENT: Negative.  Eyes: Negative.   Respiratory: Negative.    Cardiovascular: Negative.   Gastrointestinal: Negative.   Genitourinary: Negative.   Musculoskeletal: Negative.   Skin: Negative.   Neurological: Negative.   Endo/Heme/Allergies: Negative.   Psychiatric/Behavioral: Negative.       Objective:   Vitals:   02/17/23 1133  BP: 110/60  Pulse: 73  Resp: 16  Temp: 97.6 F (36.4 C)  SpO2: 97%          Physical Exam Constitutional:      Appearance: She is not diaphoretic.  HENT:     Head: Normocephalic.     Right Ear: Ear canal and external ear normal. Tympanic membrane is scarred.     Left Ear: Ear canal and external ear normal. Tympanic membrane is scarred.     Nose: Nose normal. No mucosal edema or rhinorrhea.     Mouth/Throat:     Pharynx: Uvula midline. No oropharyngeal exudate.  Eyes:     Conjunctiva/sclera: Conjunctivae normal.  Neck:     Thyroid: No thyromegaly.     Trachea: Trachea normal. No tracheal tenderness or tracheal deviation.  Cardiovascular:     Rate and Rhythm: Normal rate and regular rhythm.     Heart sounds: Normal heart sounds, S1 normal and S2 normal. No murmur heard. Pulmonary:     Effort: No respiratory distress.     Breath sounds: Normal breath sounds. No  stridor. No wheezing or rales.  Lymphadenopathy:     Head:     Right side of head: No tonsillar adenopathy.     Left side of head: No tonsillar adenopathy.     Cervical: No cervical adenopathy.  Skin:    Findings: No erythema or rash.     Nails: There is no clubbing.  Neurological:     Mental Status: She is alert.    Diagnostics:    Spirometry was performed and demonstrated an FEV1 of 1.34 at 76 % of predicted.  The patient had an Asthma Control Test with the following results: ACT Total Score: 23.    Assessment and Plan:   1. Asthma, severe persistent, well-controlled   2. Other allergic rhinitis   3. Chronic pansinusitis   4. History of mastoiditis   5. Gastroesophageal reflux disease, unspecified whether esophagitis present   6. Food allergy    1. Continue Tezepelumab injections every 4 weeks (additional insurance)  2. Continue Breztri - 2 inhalations 1-2 times per day with spacer   3. Continue Nasacort one spray each nostril 1-2 times per day  4. Continue omeprazole 20 mg 1 time per day  5. If needed:   A.  Nasal saline multiple times per day  B.  AirSupra -2 inhalations every 4-6 hours   C.  Duoneb - nebulize every 4-6 hours D.  Epi-Pen  6. Return to clinic in 6 months or earlier if needed  7. Plan for fall flu vaccine  Anne Kerr appears to be doing okay while using a collection of anti-inflammatory agents for airway including anti-TSL P antibody and also continues on therapy for her reflux.  Will keep her on the plan noted above and we will see her back in this clinic in 6 months or earlier if there is a problem.   Anne Schimke, MD Allergy / Immunology [facility]

## 2023-02-17 NOTE — Patient Instructions (Addendum)
  1. Continue Tezepelumab injections every 4 weeks (additional insurance)  2. Continue Breztri - 2 inhalations 1-2 times per day with spacer   3. Continue Nasacort one spray each nostril 1-2 times per day  4. Continue omeprazole 20 mg 1 time per day  5. If needed:   A.  Nasal saline multiple times per day  B.  AirSupra -2 inhalations every 4-6 hours   C.  Duoneb - nebulize every 4-6 hours D.  Epi-Pen  6. Return to clinic in 6 months or earlier if needed  7. Plan for fall flu vaccine

## 2023-02-18 ENCOUNTER — Encounter: Payer: Self-pay | Admitting: Allergy and Immunology

## 2023-02-20 ENCOUNTER — Ambulatory Visit (INDEPENDENT_AMBULATORY_CARE_PROVIDER_SITE_OTHER): Payer: Medicare HMO

## 2023-02-20 DIAGNOSIS — J4550 Severe persistent asthma, uncomplicated: Secondary | ICD-10-CM | POA: Diagnosis not present

## 2023-02-24 ENCOUNTER — Ambulatory Visit: Payer: Medicare Other | Admitting: Allergy and Immunology

## 2023-03-20 ENCOUNTER — Ambulatory Visit: Payer: Medicare HMO

## 2023-03-27 ENCOUNTER — Ambulatory Visit (INDEPENDENT_AMBULATORY_CARE_PROVIDER_SITE_OTHER): Payer: Medicare PPO

## 2023-03-27 DIAGNOSIS — J4550 Severe persistent asthma, uncomplicated: Secondary | ICD-10-CM | POA: Diagnosis not present

## 2023-03-28 ENCOUNTER — Other Ambulatory Visit: Payer: Self-pay | Admitting: Family

## 2023-03-28 ENCOUNTER — Other Ambulatory Visit: Payer: Self-pay | Admitting: Cardiovascular Disease

## 2023-03-28 ENCOUNTER — Other Ambulatory Visit: Payer: Self-pay | Admitting: Allergy and Immunology

## 2023-03-28 DIAGNOSIS — I10 Essential (primary) hypertension: Secondary | ICD-10-CM

## 2023-03-28 DIAGNOSIS — E785 Hyperlipidemia, unspecified: Secondary | ICD-10-CM

## 2023-04-13 ENCOUNTER — Ambulatory Visit (INDEPENDENT_AMBULATORY_CARE_PROVIDER_SITE_OTHER): Payer: Medicare Other | Admitting: Cardiovascular Disease

## 2023-04-13 ENCOUNTER — Encounter: Payer: Self-pay | Admitting: Cardiovascular Disease

## 2023-04-13 VITALS — BP 112/80 | HR 77 | Ht 63.0 in | Wt 187.6 lb

## 2023-04-13 DIAGNOSIS — I2585 Chronic coronary microvascular dysfunction: Secondary | ICD-10-CM | POA: Diagnosis not present

## 2023-04-13 DIAGNOSIS — E782 Mixed hyperlipidemia: Secondary | ICD-10-CM

## 2023-04-13 DIAGNOSIS — R42 Dizziness and giddiness: Secondary | ICD-10-CM

## 2023-04-13 DIAGNOSIS — I340 Nonrheumatic mitral (valve) insufficiency: Secondary | ICD-10-CM | POA: Diagnosis not present

## 2023-04-13 DIAGNOSIS — E1165 Type 2 diabetes mellitus with hyperglycemia: Secondary | ICD-10-CM | POA: Diagnosis not present

## 2023-04-13 DIAGNOSIS — Z794 Long term (current) use of insulin: Secondary | ICD-10-CM

## 2023-04-13 DIAGNOSIS — R0789 Other chest pain: Secondary | ICD-10-CM

## 2023-04-13 NOTE — Progress Notes (Signed)
Cardiology Office Note   Date:  04/13/2023   ID:  Anne Kerr, DOB 1951/02/14, MRN [id number]  PCP:  Miki Kins, FNP  Cardiologist:  Adrian Blackwater, MD      History of Present Illness: Anne Kerr is a 72 y.o. female who presents for  Chief Complaint  Patient presents with   Follow-up    3 Months Follow Up    Chest Pain  This is a chronic problem. The current episode started 1 to 4 weeks ago. The onset quality is gradual. The problem occurs 2 to 4 times per day. The problem has been gradually improving.      Past Medical History:  Diagnosis Date   Arthritis    Asthma    allergy induced   Complication of anesthesia    hard to wake up   Diabetes mellitus without complication (HCC)    GERD (gastroesophageal reflux disease)    High blood pressure    History of ear surgery 05/01/2021   History of kidney stones    h/o   Hyperlipidemia    Leaky heart valve    Leaky heart valve    Pain 07/09/2016   Pain in left knee 04/27/2019   Tachycardia    TIA (transient ischemic attack)    no residual side effects-pt states she didnt even know- a doctor told her this   Uterine cancer (HCC) 1989     Past Surgical History:  Procedure Laterality Date   COLONOSCOPY     KNEE ARTHROSCOPY Right    NASAL SINUS SURGERY     x 4   PARTIAL HYSTERECTOMY     SINOSCOPY     TOTAL KNEE ARTHROPLASTY Left 05/11/2019   Procedure: TOTAL KNEE ARTHROPLASTY;  Surgeon: Lyndle Herrlich, MD;  Location: [facility] ORS;  Service: Orthopedics;  Laterality: Left;     Current Outpatient Medications  Medication Sig Dispense Refill   acetaminophen (TYLENOL) 500 MG tablet Take one to two if needed     AIRSUPRA 90-80 MCG/ACT AERO Inhale 2 puffs into the lungs every 4 (four) hours as needed. 10.7 g 1   albuterol (PROVENTIL) (2.5 MG/3ML) 0.083% nebulizer solution Take 3 mLs (2.5 mg total) by nebulization every 4 (four) hours as needed for wheezing or shortness of breath. 75 mL 1   albuterol  (VENTOLIN HFA) 108 (90 Base) MCG/ACT inhaler Inhale 2 puffs into the lungs every 4 (four) hours as needed for wheezing or shortness of breath. 18 g 1   alendronate (FOSAMAX) 70 MG tablet Take by mouth.     amLODipine (NORVASC) 5 MG tablet Take by mouth.     ascorbic acid (VITAMIN C) 500 MG tablet Take one every day (Patient not taking: Reported on 02/17/2023)     azelastine (ASTELIN) 0.1 % nasal spray USE ONE SPRAY IN EACH NOSTRIL TWICE DAILY X 1 WEEK.     BD DISP NEEDLES 25G X 5/8" MISC USE AS DIRECTED TO ADMINISTER XOLAIR 3 each 11   Budeson-Glycopyrrol-Formoterol (BREZTRI AEROSPHERE) 160-9-4.8 MCG/ACT AERO Inhale 2 puffs into the lungs in the morning and at bedtime. 10.7 g 5   Calcium Carbonate-Vitamin D (CALCIUM 600+D PO) Take 1 tablet by mouth daily.     cetirizine (ZYRTEC) 10 MG tablet TAKE 1 TABLET BY MOUTH EVERY DAY 90 tablet 1   Cholecalciferol (VITAMIN D-3) 125 MCG (5000 UT) TABS Take 5,000 Units by mouth daily.      Coenzyme Q10 100 MG capsule Take by mouth.  Cyanocobalamin (VITAMIN B-12 PO) Take 3,000 mcg by mouth daily.      empagliflozin (JARDIANCE) 10 MG TABS tablet Take 1 tablet (10 mg total) by mouth daily. 30 tablet 3   EPINEPHrine (EPIPEN 2-PAK) 0.3 mg/0.3 mL IJ SOAJ injection Inject 0.3 mg into the muscle as needed for anaphylaxis. 0.3 mL 1   EPINEPHrine 0.3 mg/0.3 mL IJ SOAJ injection epinephrine 0.3 mg/0.3 mL injection, auto-injector     fexofenadine (ALLEGRA) 180 MG tablet Take 1 tablet (180 mg total) by mouth daily. 90 tablet 1   ipratropium-albuterol (DUONEB) 0.5-2.5 (3) MG/3ML SOLN Take 3 mLs by nebulization every 6 (six) hours as needed. 100 mL 1   metFORMIN (GLUCOPHAGE) 500 MG tablet TAKE 1 TABLET BY MOUTH TWICE A DAY 180 tablet 3   metoprolol succinate (TOPROL-XL) 50 MG 24 hr tablet TAKE 1 TABLET BY MOUTH EVERY DAY 90 tablet 0   montelukast (SINGULAIR) 10 MG tablet TAKE 1 TABLET BY MOUTH EVERYDAY AT BEDTIME 30 tablet 4   omeprazole (PRILOSEC OTC) 20 MG tablet Take 1  tablet (20 mg total) by mouth daily. 30 tablet 5   rivaroxaban (XARELTO) 10 MG TABS tablet Xarelto 10 mg tablet  TAKE 1 TABLET (10 MG TOTAL) BY MOUTH DAILY WITH BREAKFAST.     rosuvastatin (CRESTOR) 20 MG tablet TAKE 1 TABLET BY MOUTH EVERY DAY 90 tablet 0   triamcinolone (NASACORT) 55 MCG/ACT AERO nasal inhaler Place 2 sprays into the nose daily. 16.5 g 5   Triamcinolone Acetonide (NASACORT AQ NA) Place 2 sprays into the nose as needed.      UNABLE TO FIND Fluzone High-Dose Quad 2020-21 (PF) 240 mcg/0.7 mL IM syringe  PHARMACY ADMINISTERED     valsartan-hydrochlorothiazide (DIOVAN-HCT) 80-12.5 MG tablet TAKE 1 TABLET BY MOUTH EVERY DAY 90 tablet 1   Current Facility-Administered Medications  Medication Dose Route Frequency Provider Last Rate Last Admin   tezepelumab-ekko (TEZSPIRE) 210 MG/1. syringe 210 mg  210 mg Subcutaneous Q28 days Jessica Priest, MD   210 mg at 03/27/23 1449    Allergies:   Aspirin, Ibuprofen, Other, Shellfish allergy, Shellfish-derived products, Bee venom, Iodine, and Lisinopril    Social History:   reports that she has never smoked. She has never used smokeless tobacco. She reports that she does not drink alcohol and does not use drugs.   Family History:  family history is not on file.    ROS:     Review of Systems  Constitutional: Negative.   HENT: Negative.    Eyes: Negative.   Respiratory: Negative.    Cardiovascular:  Positive for chest pain.  Gastrointestinal: Negative.   Genitourinary: Negative.   Musculoskeletal: Negative.   Skin: Negative.   Neurological: Negative.   Endo/Heme/Allergies: Negative.   Psychiatric/Behavioral: Negative.    All other systems reviewed and are negative.     All other systems are reviewed and negative.    PHYSICAL EXAM: VS:  BP 112/80   Pulse 77   Ht 5\' 3"  (1.6 m)   Wt 187 lb 9.6 oz (85.1 kg)   SpO2 99%   BMI 33.23 kg/m  , BMI Body mass index is 33.23 kg/m. Last weight:  Wt Readings from Last 3  Encounters:  04/13/23 187 lb 9.6 oz (85.1 kg)  01/12/23 191 lb (86.6 kg)  12/23/22 192 lb (87.1 kg)     Physical Exam Constitutional:      Appearance: Normal appearance.  Cardiovascular:     Rate and Rhythm: Normal rate and regular  rhythm.     Heart sounds: Normal heart sounds.  Pulmonary:     Effort: Pulmonary effort is normal.     Breath sounds: Normal breath sounds.  Musculoskeletal:     Right lower leg: No edema.     Left lower leg: No edema.  Neurological:     Mental Status: She is alert.       EKG:   Recent Labs: No results found for requested labs within last 365 days.    Lipid Panel No results found for: "CHOL", "TRIG", "HDL", "CHOLHDL", "VLDL", "LDLCALC", "LDLDIRECT"    Other studies Reviewed: Additional studies/ records that were reviewed today include:  Review of the above records demonstrates:       No data to display            ASSESSMENT AND PLAN:    ICD-10-CM   1. Type 2 diabetes mellitus with hyperglycemia, with long-term current use of insulin (HCC)  E11.65 MYOCARDIAL PERFUSION IMAGING   Z79.4 PCV ECHOCARDIOGRAM COMPLETE    2. Dizziness  R42 MYOCARDIAL PERFUSION IMAGING    PCV ECHOCARDIOGRAM COMPLETE    3. Nonrheumatic mitral valve regurgitation  I34.0 MYOCARDIAL PERFUSION IMAGING    PCV ECHOCARDIOGRAM COMPLETE    4. Mixed hyperlipidemia  E78.2 MYOCARDIAL PERFUSION IMAGING    PCV ECHOCARDIOGRAM COMPLETE    5. Chronic coronary microvascular dysfunction  I25.85 MYOCARDIAL PERFUSION IMAGING    PCV ECHOCARDIOGRAM COMPLETE    6. Other chest pain  R07.89 MYOCARDIAL PERFUSION IMAGING    PCV ECHOCARDIOGRAM COMPLETE   Has recurrent chest pains, advise echo, stress test       Problem List Items Addressed This Visit       Endocrine   Type 2 diabetes mellitus with hyperglycemia (HCC) - Primary   Relevant Orders   MYOCARDIAL PERFUSION IMAGING   PCV ECHOCARDIOGRAM COMPLETE   Other Visit Diagnoses     Dizziness       Relevant  Orders   MYOCARDIAL PERFUSION IMAGING   PCV ECHOCARDIOGRAM COMPLETE   Nonrheumatic mitral valve regurgitation       Relevant Orders   MYOCARDIAL PERFUSION IMAGING   PCV ECHOCARDIOGRAM COMPLETE   Mixed hyperlipidemia       Relevant Orders   MYOCARDIAL PERFUSION IMAGING   PCV ECHOCARDIOGRAM COMPLETE   Chronic coronary microvascular dysfunction       Relevant Orders   MYOCARDIAL PERFUSION IMAGING   PCV ECHOCARDIOGRAM COMPLETE   Other chest pain       Has recurrent chest pains, advise echo, stress test   Relevant Orders   MYOCARDIAL PERFUSION IMAGING   PCV ECHOCARDIOGRAM COMPLETE          Disposition:   Return in about 4 weeks (around 05/11/2023) for echo, stress test and f/u.    Total time spent: 30 minutes  Signed,  Adrian Blackwater, MD  04/13/2023 9:24 AM    [facility]

## 2023-04-20 ENCOUNTER — Ambulatory Visit (INDEPENDENT_AMBULATORY_CARE_PROVIDER_SITE_OTHER): Payer: Medicare Other

## 2023-04-20 DIAGNOSIS — I2585 Chronic coronary microvascular dysfunction: Secondary | ICD-10-CM | POA: Diagnosis not present

## 2023-04-20 DIAGNOSIS — R0789 Other chest pain: Secondary | ICD-10-CM | POA: Diagnosis not present

## 2023-04-20 DIAGNOSIS — R42 Dizziness and giddiness: Secondary | ICD-10-CM

## 2023-04-20 DIAGNOSIS — I340 Nonrheumatic mitral (valve) insufficiency: Secondary | ICD-10-CM

## 2023-04-20 DIAGNOSIS — E1165 Type 2 diabetes mellitus with hyperglycemia: Secondary | ICD-10-CM

## 2023-04-20 DIAGNOSIS — E782 Mixed hyperlipidemia: Secondary | ICD-10-CM

## 2023-04-20 MED ORDER — TECHNETIUM TC 99M SESTAMIBI GENERIC - CARDIOLITE
9.9000 | Freq: Once | INTRAVENOUS | Status: AC | PRN
  Administered 2023-04-20: 9.9 via INTRAVENOUS

## 2023-04-20 MED ORDER — TECHNETIUM TC 99M SESTAMIBI GENERIC - CARDIOLITE
33.3000 | Freq: Once | INTRAVENOUS | Status: AC | PRN
  Administered 2023-04-20: 33.3 via INTRAVENOUS

## 2023-04-24 ENCOUNTER — Ambulatory Visit (INDEPENDENT_AMBULATORY_CARE_PROVIDER_SITE_OTHER): Payer: Medicare PPO | Admitting: *Deleted

## 2023-04-24 DIAGNOSIS — J4550 Severe persistent asthma, uncomplicated: Secondary | ICD-10-CM | POA: Diagnosis not present

## 2023-04-25 ENCOUNTER — Other Ambulatory Visit: Payer: Self-pay | Admitting: Allergy and Immunology

## 2023-04-27 ENCOUNTER — Other Ambulatory Visit: Payer: Self-pay | Admitting: Family

## 2023-05-04 ENCOUNTER — Ambulatory Visit (INDEPENDENT_AMBULATORY_CARE_PROVIDER_SITE_OTHER): Payer: Medicare Other

## 2023-05-04 DIAGNOSIS — I361 Nonrheumatic tricuspid (valve) insufficiency: Secondary | ICD-10-CM | POA: Diagnosis not present

## 2023-05-04 DIAGNOSIS — R42 Dizziness and giddiness: Secondary | ICD-10-CM

## 2023-05-04 DIAGNOSIS — I2585 Chronic coronary microvascular dysfunction: Secondary | ICD-10-CM

## 2023-05-04 DIAGNOSIS — I340 Nonrheumatic mitral (valve) insufficiency: Secondary | ICD-10-CM

## 2023-05-04 DIAGNOSIS — R0789 Other chest pain: Secondary | ICD-10-CM

## 2023-05-04 DIAGNOSIS — E1165 Type 2 diabetes mellitus with hyperglycemia: Secondary | ICD-10-CM

## 2023-05-04 DIAGNOSIS — E782 Mixed hyperlipidemia: Secondary | ICD-10-CM

## 2023-05-07 ENCOUNTER — Ambulatory Visit (INDEPENDENT_AMBULATORY_CARE_PROVIDER_SITE_OTHER): Payer: Medicare Other | Admitting: Cardiovascular Disease

## 2023-05-07 ENCOUNTER — Encounter: Payer: Self-pay | Admitting: Cardiovascular Disease

## 2023-05-07 VITALS — BP 132/62 | HR 72 | Ht 63.0 in | Wt 186.0 lb

## 2023-05-07 DIAGNOSIS — K219 Gastro-esophageal reflux disease without esophagitis: Secondary | ICD-10-CM | POA: Diagnosis not present

## 2023-05-07 DIAGNOSIS — R9439 Abnormal result of other cardiovascular function study: Secondary | ICD-10-CM | POA: Diagnosis not present

## 2023-05-07 DIAGNOSIS — I340 Nonrheumatic mitral (valve) insufficiency: Secondary | ICD-10-CM

## 2023-05-07 DIAGNOSIS — Z794 Long term (current) use of insulin: Secondary | ICD-10-CM | POA: Diagnosis not present

## 2023-05-07 DIAGNOSIS — R0789 Other chest pain: Secondary | ICD-10-CM

## 2023-05-07 DIAGNOSIS — E1165 Type 2 diabetes mellitus with hyperglycemia: Secondary | ICD-10-CM | POA: Diagnosis not present

## 2023-05-07 NOTE — Progress Notes (Signed)
Cardiology Office Note   Date:  05/07/2023   ID:  Anne Kerr, DOB Jan 23, 1951, MRN [id number]  PCP:  Miki Kins, FNP  Cardiologist:  Adrian Blackwater, MD      History of Present Illness: Anne Kerr is a 72 y.o. female who presents for  Chief Complaint  Patient presents with   Follow-up    NST & ECHO Results    Has occasional chest pain  Chest Pain  This is a chronic problem. The problem has been resolved.      Past Medical History:  Diagnosis Date   Arthritis    Asthma    allergy induced   Complication of anesthesia    hard to wake up   Diabetes mellitus without complication (HCC)    GERD (gastroesophageal reflux disease)    High blood pressure    History of ear surgery 05/01/2021   History of kidney stones    h/o   Hyperlipidemia    Leaky heart valve    Leaky heart valve    Pain 07/09/2016   Pain in left knee 04/27/2019   Tachycardia    TIA (transient ischemic attack)    no residual side effects-pt states she didnt even know- a doctor told her this   Uterine cancer (HCC) 1989     Past Surgical History:  Procedure Laterality Date   COLONOSCOPY     KNEE ARTHROSCOPY Right    NASAL SINUS SURGERY     x 4   PARTIAL HYSTERECTOMY     SINOSCOPY     TOTAL KNEE ARTHROPLASTY Left 05/11/2019   Procedure: TOTAL KNEE ARTHROPLASTY;  Surgeon: Lyndle Herrlich, MD;  Location: [facility] ORS;  Service: Orthopedics;  Laterality: Left;     Current Outpatient Medications  Medication Sig Dispense Refill   acetaminophen (TYLENOL) 500 MG tablet Take one to two if needed     AIRSUPRA 90-80 MCG/ACT AERO Inhale 2 puffs into the lungs every 4 (four) hours as needed. 10.7 g 1   albuterol (PROVENTIL) (2.5 MG/3ML) 0.083% nebulizer solution Take 3 mLs (2.5 mg total) by nebulization every 4 (four) hours as needed for wheezing or shortness of breath. 75 mL 1   albuterol (VENTOLIN HFA) 108 (90 Base) MCG/ACT inhaler Inhale 2 puffs into the lungs every 4 (four) hours as needed  for wheezing or shortness of breath. 18 g 1   alendronate (FOSAMAX) 70 MG tablet Take by mouth.     amLODipine (NORVASC) 5 MG tablet Take by mouth.     ascorbic acid (VITAMIN C) 500 MG tablet Take one every day (Patient not taking: Reported on 02/17/2023)     azelastine (ASTELIN) 0.1 % nasal spray USE ONE SPRAY IN EACH NOSTRIL TWICE DAILY X 1 WEEK.     BD DISP NEEDLES 25G X 5/8" MISC USE AS DIRECTED TO ADMINISTER XOLAIR 3 each 11   Budeson-Glycopyrrol-Formoterol (BREZTRI AEROSPHERE) 160-9-4.8 MCG/ACT AERO Inhale 2 puffs into the lungs in the morning and at bedtime. 10.7 g 5   Calcium Carbonate-Vitamin D (CALCIUM 600+D PO) Take 1 tablet by mouth daily.     cetirizine (ZYRTEC) 10 MG tablet TAKE 1 TABLET BY MOUTH EVERY DAY 90 tablet 1   Cholecalciferol (VITAMIN D-3) 125 MCG (5000 UT) TABS Take 5,000 Units by mouth daily.      Coenzyme Q10 100 MG capsule Take by mouth.     Cyanocobalamin (VITAMIN B-12 PO) Take 3,000 mcg by mouth daily.      EPINEPHrine (EPIPEN  2-PAK) 0.3 mg/0.3 mL IJ SOAJ injection Inject 0.3 mg into the muscle as needed for anaphylaxis. 0.3 mL 1   EPINEPHrine 0.3 mg/0.3 mL IJ SOAJ injection epinephrine 0.3 mg/0.3 mL injection, auto-injector     fexofenadine (ALLEGRA) 180 MG tablet Take 1 tablet (180 mg total) by mouth daily. 90 tablet 1   ipratropium-albuterol (DUONEB) 0.5-2.5 (3) MG/3ML SOLN Take 3 mLs by nebulization every 6 (six) hours as needed. 100 mL 1   JARDIANCE 10 MG TABS tablet TAKE 1 TABLET BY MOUTH EVERY DAY 30 tablet 3   metFORMIN (GLUCOPHAGE) 500 MG tablet TAKE 1 TABLET BY MOUTH TWICE A DAY 180 tablet 3   metoprolol succinate (TOPROL-XL) 50 MG 24 hr tablet TAKE 1 TABLET BY MOUTH EVERY DAY 90 tablet 0   montelukast (SINGULAIR) 10 MG tablet TAKE 1 TABLET BY MOUTH EVERYDAY AT BEDTIME 30 tablet 4   omeprazole (PRILOSEC OTC) 20 MG tablet Take 1 tablet (20 mg total) by mouth daily. 30 tablet 5   rivaroxaban (XARELTO) 10 MG TABS tablet Xarelto 10 mg tablet  TAKE 1 TABLET (10  MG TOTAL) BY MOUTH DAILY WITH BREAKFAST.     rosuvastatin (CRESTOR) 20 MG tablet TAKE 1 TABLET BY MOUTH EVERY DAY 90 tablet 0   triamcinolone (NASACORT) 55 MCG/ACT AERO nasal inhaler Place 2 sprays into the nose daily. 16.5 g 5   Triamcinolone Acetonide (NASACORT AQ NA) Place 2 sprays into the nose as needed.      UNABLE TO FIND Fluzone High-Dose Quad 2020-21 (PF) 240 mcg/0.7 mL IM syringe  PHARMACY ADMINISTERED     valsartan-hydrochlorothiazide (DIOVAN-HCT) 80-12.5 MG tablet TAKE 1 TABLET BY MOUTH EVERY DAY 90 tablet 1   Current Facility-Administered Medications  Medication Dose Route Frequency Provider Last Rate Last Admin   tezepelumab-ekko (TEZSPIRE) 210 MG/1. syringe 210 mg  210 mg Subcutaneous Q28 days Jessica Priest, MD   210 mg at 04/24/23 1550    Allergies:   Aspirin, Ibuprofen, Other, Shellfish allergy, Shellfish-derived products, Bee venom, Iodine, and Lisinopril    Social History:   reports that she has never smoked. She has never used smokeless tobacco. She reports that she does not drink alcohol and does not use drugs.   Family History:  family history is not on file.    ROS:     Review of Systems  Constitutional: Negative.   HENT: Negative.    Eyes: Negative.   Respiratory: Negative.    Cardiovascular:  Positive for chest pain.  Gastrointestinal: Negative.   Genitourinary: Negative.   Musculoskeletal: Negative.   Skin: Negative.   Neurological: Negative.   Endo/Heme/Allergies: Negative.   Psychiatric/Behavioral: Negative.    All other systems reviewed and are negative.     All other systems are reviewed and negative.    PHYSICAL EXAM: VS:  BP 132/62   Pulse 72   Ht 5\' 3"  (1.6 m)   Wt 186 lb (84.4 kg)   SpO2 94%   BMI 32.95 kg/m  , BMI Body mass index is 32.95 kg/m. Last weight:  Wt Readings from Last 3 Encounters:  05/07/23 186 lb (84.4 kg)  04/13/23 187 lb 9.6 oz (85.1 kg)  01/12/23 191 lb (86.6 kg)     Physical Exam    EKG:   Recent  Labs: No results found for requested labs within last 365 days.    Lipid Panel No results found for: "CHOL", "TRIG", "HDL", "CHOLHDL", "VLDL", "LDLCALC", "LDLDIRECT"    Other studies Reviewed: Additional studies/ records that  were reviewed today include:  Review of the above records demonstrates:       No data to display            ASSESSMENT AND PLAN:    ICD-10-CM   1. Gastroesophageal reflux disease, unspecified whether esophagitis present  K21.9     2. Type 2 diabetes mellitus with hyperglycemia, with long-term current use of insulin (HCC)  E11.65    Z79.4     3. Chest pain, non-cardiac  R07.89    Chest pain most likely due to GERD.    4. Abnormal nuclear stress test  R94.39    Mild inferior wall reversible defect with normal EF.  Patient had calcium score of 16 and no significant CAD in the past.    5. Nonrheumatic mitral valve regurgitation  I34.0    Mild mitral regurgitation with normal ejection fraction on echocardiogram       Problem List Items Addressed This Visit       Digestive   GERD (gastroesophageal reflux disease) - Primary     Endocrine   Type 2 diabetes mellitus with hyperglycemia (HCC)   Other Visit Diagnoses     Chest pain, non-cardiac       Chest pain most likely due to GERD.   Abnormal nuclear stress test       Mild inferior wall reversible defect with normal EF.  Patient had calcium score of 16 and no significant CAD in the past.   Nonrheumatic mitral valve regurgitation       Mild mitral regurgitation with normal ejection fraction on echocardiogram          Disposition:   Return in about 3 months (around 08/07/2023).    Total time spent: 30 minutes  Signed,  Adrian Blackwater, MD  05/07/2023 10:14 AM    [facility]

## 2023-05-08 DIAGNOSIS — E119 Type 2 diabetes mellitus without complications: Secondary | ICD-10-CM | POA: Diagnosis not present

## 2023-05-08 DIAGNOSIS — Z0100 Encounter for examination of eyes and vision without abnormal findings: Secondary | ICD-10-CM | POA: Diagnosis not present

## 2023-05-08 DIAGNOSIS — H04123 Dry eye syndrome of bilateral lacrimal glands: Secondary | ICD-10-CM | POA: Diagnosis not present

## 2023-05-08 DIAGNOSIS — H2513 Age-related nuclear cataract, bilateral: Secondary | ICD-10-CM | POA: Diagnosis not present

## 2023-05-08 DIAGNOSIS — H43813 Vitreous degeneration, bilateral: Secondary | ICD-10-CM | POA: Diagnosis not present

## 2023-05-09 LAB — HM DIABETES EYE EXAM

## 2023-05-11 ENCOUNTER — Encounter: Payer: Self-pay | Admitting: Internal Medicine

## 2023-05-25 ENCOUNTER — Encounter: Payer: Self-pay | Admitting: Family

## 2023-05-25 ENCOUNTER — Ambulatory Visit (INDEPENDENT_AMBULATORY_CARE_PROVIDER_SITE_OTHER): Payer: Medicare Other | Admitting: Family

## 2023-05-25 VITALS — BP 130/69 | HR 68 | Ht 63.0 in | Wt 184.2 lb

## 2023-05-25 DIAGNOSIS — E782 Mixed hyperlipidemia: Secondary | ICD-10-CM

## 2023-05-25 DIAGNOSIS — E1165 Type 2 diabetes mellitus with hyperglycemia: Secondary | ICD-10-CM | POA: Diagnosis not present

## 2023-05-25 DIAGNOSIS — E559 Vitamin D deficiency, unspecified: Secondary | ICD-10-CM

## 2023-05-25 DIAGNOSIS — R5383 Other fatigue: Secondary | ICD-10-CM

## 2023-05-25 DIAGNOSIS — I10 Essential (primary) hypertension: Secondary | ICD-10-CM

## 2023-05-25 DIAGNOSIS — Z794 Long term (current) use of insulin: Secondary | ICD-10-CM | POA: Diagnosis not present

## 2023-05-25 DIAGNOSIS — E538 Deficiency of other specified B group vitamins: Secondary | ICD-10-CM | POA: Diagnosis not present

## 2023-05-25 DIAGNOSIS — E039 Hypothyroidism, unspecified: Secondary | ICD-10-CM | POA: Diagnosis not present

## 2023-05-25 LAB — POCT CBG (FASTING - GLUCOSE)-MANUAL ENTRY: Glucose Fasting, POC: 124 mg/dL — AB (ref 70–99)

## 2023-05-25 MED ORDER — FLUCONAZOLE 100 MG PO TABS: 100.0000 mg | ORAL_TABLET | Freq: Every day | ORAL | 6 refills | Status: DC

## 2023-05-25 NOTE — Progress Notes (Signed)
Established Patient Office Visit  Subjective:  Patient ID: Anne Kerr, female    DOB: 06-Jul-1951  Age: 72 y.o. MRN: [id number]  Chief Complaint  Patient presents with   Follow-up    5 mo f/u    Patient is here today for her 5 months follow up.  She has been feeling fairly well since last appointment.   She does have additional concerns to discuss today.  Labs are due today. She needs refills.   I have reviewed her active problem list, medication list, allergies, health maintenance, notes from last encounter, lab results for her appointment today.    No other concerns at this time.   Past Medical History:  Diagnosis Date   Acute non-recurrent maxillary sinusitis 07/25/2015   Arthritis    Asthma    allergy induced   Complication of anesthesia    hard to wake up   Diabetes mellitus without complication (HCC)    GERD (gastroesophageal reflux disease)    High blood pressure    History of ear surgery 05/01/2021   History of kidney stones    h/o   Hyperlipidemia    Leaky heart valve    Leaky heart valve    Pain 07/09/2016   Pain in left knee 04/27/2019   Tachycardia    TIA (transient ischemic attack)    no residual side effects-pt states she didnt even know- a doctor told her this   Uterine cancer (HCC) 1989    Past Surgical History:  Procedure Laterality Date   COLONOSCOPY     KNEE ARTHROSCOPY Right    NASAL SINUS SURGERY     x 4   PARTIAL HYSTERECTOMY     SINOSCOPY     TOTAL KNEE ARTHROPLASTY Left 05/11/2019   Procedure: TOTAL KNEE ARTHROPLASTY;  Surgeon: Lyndle Herrlich, MD;  Location: [facility] ORS;  Service: Orthopedics;  Laterality: Left;    Social History   Socioeconomic History   Marital status: Married    Spouse name: Not on file   Number of children: Not on file   Years of education: Not on file   Highest education level: Not on file  Occupational History   Not on file  Tobacco Use   Smoking status: Never   Smokeless tobacco: Never  Vaping  Use   Vaping status: Never Used  Substance and Sexual Activity   Alcohol use: No    Alcohol/week: 0.0 standard drinks of alcohol   Drug use: No   Sexual activity: Not on file  Other Topics Concern   Not on file  Social History Narrative   Not on file   Social Determinants of Health   Financial Resource Strain: Not on file  Food Insecurity: Not on file  Transportation Needs: Not on file  Physical Activity: Unknown (12/24/2022)   Received from [facility]   PCARE Exercise SDOH    PCare Exercise SDOH: Not on file    PCare Exercise SDOH: Not on file    Weight Bearing Exercises: No  Stress: Not on file  Social Connections: Not on file  Intimate Partner Violence: Not on file    Family History  Problem Relation Age of Onset   Allergic rhinitis Neg Hx    Angioedema Neg Hx    Asthma Neg Hx    Atopy Neg Hx    Eczema Neg Hx    Immunodeficiency Neg Hx    Urticaria Neg Hx     Allergies  Allergen Reactions  Aspirin Other (See Comments)    Wheeze  Wheeze  Wheeze  Wheeze  GI irritation  Wheeze, Wheeze, Wheeze   Ibuprofen Cough, Other (See Comments), Shortness Of Breath and Itching    Wheeze  Wheeze  Wheeze  Wheeze  Bronchial tubes swelled up  Wheeze, Wheeze, Wheeze   Other Other (See Comments) and Swelling    Bee Sting  Asthma Symptoms  Bee Sting  Bee Sting  Asthma Symptoms  Asthma Symptoms   Shellfish Allergy Other (See Comments)    Asthma Symptoms   Shellfish-Derived Products Other (See Comments)    Asthma Symptoms  Swelling of bronchial tubes   Bee Venom Swelling   Iodine Other (See Comments)    ASTHMA SYMPTOMS  ASTHMA SYMPTOMS  ASTHMA SYMPTOMS  ASTHMA SYMPTOMS, ASTHMA SYMPTOMS   Lisinopril     Review of Systems  All other systems reviewed and are negative.      Objective:   BP 130/69   Pulse 68   Ht 5\' 3"  (1.6 m)   Wt 184 lb 3.2 oz (83.6 kg)   SpO2 97%   BMI 32.63 kg/m   Vitals:   05/25/23 0848  BP: 130/69   Pulse: 68  Height: 5\' 3"  (1.6 m)  Weight: 184 lb 3.2 oz (83.6 kg)  SpO2: 97%  BMI (Calculated): 32.64    Physical Exam Vitals and nursing note reviewed.  Constitutional:      Appearance: Normal appearance. She is normal weight.  HENT:     Head: Normocephalic.  Eyes:     Extraocular Movements: Extraocular movements intact.     Conjunctiva/sclera: Conjunctivae normal.     Pupils: Pupils are equal, round, and reactive to light.  Cardiovascular:     Rate and Rhythm: Normal rate.  Pulmonary:     Effort: Pulmonary effort is normal.  Neurological:     General: No focal deficit present.     Mental Status: She is alert and oriented to person, place, and time. Mental status is at baseline.  Psychiatric:        Mood and Affect: Mood normal.        Behavior: Behavior normal.        Thought Content: Thought content normal.        Judgment: Judgment normal.      Results for orders placed or performed in visit on 05/25/23  POCT CBG (Fasting - Glucose)  Result Value Ref Range   Glucose Fasting, POC 124 (A) 70 - 99 mg/dL    Recent Results (from the past 2160 hour(s))  POCT CBG (Fasting - Glucose)     Status: Abnormal   Collection Time: 05/25/23  9:07 AM  Result Value Ref Range   Glucose Fasting, POC 124 (A) 70 - 99 mg/dL       Assessment & Plan:   Problem List Items Addressed This Visit       Active Problems   Type 2 diabetes mellitus with hyperglycemia (HCC) - Primary    Checking labs today. Will call pt. With results  Continue current diabetes POC, as patient has been well controlled on current regimen.  Will adjust meds if needed based on labs.       Relevant Orders   POCT CBG (Fasting - Glucose) (Completed)   Lipid panel   VITAMIN D 25 Hydroxy (Vit-D Deficiency, Fractures)   CMP14+EGFR   TSH   Hemoglobin A1c   CBC with Differential/Platelet   Other Visit Diagnoses     B12 deficiency due  to diet       Checking labs today.  Will continue supplements as  needed.   Relevant Orders   CMP14+EGFR   TSH   Vitamin B12   CBC with Differential/Platelet   Vitamin D deficiency, unspecified       Checking labs today.  Will continue supplements as needed.   Relevant Orders   VITAMIN D 25 Hydroxy (Vit-D Deficiency, Fractures)   CMP14+EGFR   TSH   CBC with Differential/Platelet   Hypothyroidism (acquired)       Checking labs today.  Will continue supplements as needed.   Relevant Orders   CMP14+EGFR   TSH   CBC with Differential/Platelet   Essential hypertension, benign       Patient stable.  Well controlled with current therapy.   Continue current meds.   Relevant Orders   CMP14+EGFR   TSH   CBC with Differential/Platelet   Other fatigue       Checking labs today.  Will continue supplements as needed.   Relevant Orders   CMP14+EGFR   TSH   CBC with Differential/Platelet   Mixed hyperlipidemia       Checking labs today.  Continue current therapy for lipid control. Will modify as needed based on labwork results.   Relevant Orders   Lipid panel   CMP14+EGFR   TSH   CBC with Differential/Platelet       Return in about 3 months (around 08/25/2023) for AWV.   Total time spent: 30 minutes  Miki Kins, FNP  05/25/2023  This document may have been prepared by [facility] Voice Recognition software and as such may include unintentional dictation errors.

## 2023-05-25 NOTE — Assessment & Plan Note (Signed)
Checking labs today. Will call pt. With results  Continue current diabetes POC, as patient has been well controlled on current regimen.  Will adjust meds if needed based on labs.  

## 2023-05-25 NOTE — Patient Instructions (Signed)
1 

## 2023-05-26 ENCOUNTER — Ambulatory Visit (INDEPENDENT_AMBULATORY_CARE_PROVIDER_SITE_OTHER): Payer: Medicare PPO | Admitting: *Deleted

## 2023-05-26 DIAGNOSIS — J4550 Severe persistent asthma, uncomplicated: Secondary | ICD-10-CM | POA: Diagnosis not present

## 2023-05-26 LAB — CMP14+EGFR
ALT: 17 IU/L (ref 0–32)
AST: 26 IU/L (ref 0–40)
Albumin: 4.5 g/dL (ref 3.8–4.8)
Alkaline Phosphatase: 71 IU/L (ref 44–121)
BUN/Creatinine Ratio: 11 — ABNORMAL LOW (ref 12–28)
BUN: 12 mg/dL (ref 8–27)
Bilirubin Total: 0.3 mg/dL (ref 0.0–1.2)
CO2: 23 mmol/L (ref 20–29)
Calcium: 9.7 mg/dL (ref 8.7–10.3)
Chloride: 99 mmol/L (ref 96–106)
Creatinine, Ser: 1.1 mg/dL — ABNORMAL HIGH (ref 0.57–1.00)
Globulin, Total: 2.8 g/dL (ref 1.5–4.5)
Glucose: 112 mg/dL — ABNORMAL HIGH (ref 70–99)
Potassium: 4.3 mmol/L (ref 3.5–5.2)
Sodium: 140 mmol/L (ref 134–144)
Total Protein: 7.3 g/dL (ref 6.0–8.5)
eGFR: 53 mL/min/{1.73_m2} — ABNORMAL LOW (ref >59)

## 2023-05-26 LAB — LIPID PANEL
Chol/HDL Ratio: 1.9 ratio (ref 0.0–4.4)
Cholesterol, Total: 135 mg/dL (ref 100–199)
HDL: 72 mg/dL (ref >39)
LDL Chol Calc (NIH): 47 mg/dL (ref 0–99)
Triglycerides: 86 mg/dL (ref 0–149)
VLDL Cholesterol Cal: 16 mg/dL (ref 5–40)

## 2023-05-26 LAB — HEMOGLOBIN A1C
Est. average glucose Bld gHb Est-mCnc: 140 mg/dL
Hgb A1c MFr Bld: 6.5 % — ABNORMAL HIGH (ref 4.8–5.6)

## 2023-05-26 LAB — VITAMIN B12: Vitamin B-12: 1786 pg/mL — ABNORMAL HIGH (ref 232–1245)

## 2023-05-26 LAB — TSH: TSH: 1.73 u[IU]/mL (ref 0.450–4.500)

## 2023-05-26 LAB — VITAMIN D 25 HYDROXY (VIT D DEFICIENCY, FRACTURES): Vit D, 25-Hydroxy: 35.3 ng/mL (ref 30.0–100.0)

## 2023-06-23 ENCOUNTER — Ambulatory Visit (INDEPENDENT_AMBULATORY_CARE_PROVIDER_SITE_OTHER): Payer: Medicare PPO

## 2023-06-23 DIAGNOSIS — J4550 Severe persistent asthma, uncomplicated: Secondary | ICD-10-CM

## 2023-07-21 ENCOUNTER — Ambulatory Visit: Payer: Medicare PPO | Admitting: *Deleted

## 2023-07-21 DIAGNOSIS — J4550 Severe persistent asthma, uncomplicated: Secondary | ICD-10-CM

## 2023-07-24 ENCOUNTER — Other Ambulatory Visit: Payer: Self-pay | Admitting: Cardiovascular Disease

## 2023-07-24 DIAGNOSIS — I10 Essential (primary) hypertension: Secondary | ICD-10-CM

## 2023-07-31 DIAGNOSIS — Z1231 Encounter for screening mammogram for malignant neoplasm of breast: Secondary | ICD-10-CM | POA: Diagnosis not present

## 2023-08-07 ENCOUNTER — Ambulatory Visit (INDEPENDENT_AMBULATORY_CARE_PROVIDER_SITE_OTHER): Payer: Medicare Other | Admitting: Cardiovascular Disease

## 2023-08-07 ENCOUNTER — Encounter: Payer: Self-pay | Admitting: Cardiovascular Disease

## 2023-08-07 VITALS — BP 102/70 | HR 82 | Ht 63.0 in | Wt 178.8 lb

## 2023-08-07 DIAGNOSIS — I340 Nonrheumatic mitral (valve) insufficiency: Secondary | ICD-10-CM

## 2023-08-07 DIAGNOSIS — E782 Mixed hyperlipidemia: Secondary | ICD-10-CM

## 2023-08-07 DIAGNOSIS — R42 Dizziness and giddiness: Secondary | ICD-10-CM | POA: Diagnosis not present

## 2023-08-07 DIAGNOSIS — I10 Essential (primary) hypertension: Secondary | ICD-10-CM | POA: Diagnosis not present

## 2023-08-07 DIAGNOSIS — R0789 Other chest pain: Secondary | ICD-10-CM | POA: Diagnosis not present

## 2023-08-07 NOTE — Progress Notes (Signed)
Cardiology Office Note   Date:  08/07/2023   ID:  Anne Kerr, DOB 07/25/1951, MRN [id number]  PCP:  Miki Kins, FNP  Cardiologist:  Adrian Blackwater, MD      History of Present Illness: Anne Kerr is a 72 y.o. female who presents for  Chief Complaint  Patient presents with   Follow-up    3 months follow up    Doing ok      Past Medical History:  Diagnosis Date   Acute non-recurrent maxillary sinusitis 07/25/2015   Arthritis    Asthma    allergy induced   Complication of anesthesia    hard to wake up   Diabetes mellitus without complication (HCC)    GERD (gastroesophageal reflux disease)    High blood pressure    History of ear surgery 05/01/2021   History of kidney stones    h/o   Hyperlipidemia    Leaky heart valve    Leaky heart valve    Pain 07/09/2016   Pain in left knee 04/27/2019   Tachycardia    TIA (transient ischemic attack)    no residual side effects-pt states she didnt even know- a doctor told her this   Uterine cancer (HCC) 1989     Past Surgical History:  Procedure Laterality Date   COLONOSCOPY     KNEE ARTHROSCOPY Right    NASAL SINUS SURGERY     x 4   PARTIAL HYSTERECTOMY     SINOSCOPY     TOTAL KNEE ARTHROPLASTY Left 05/11/2019   Procedure: TOTAL KNEE ARTHROPLASTY;  Surgeon: Lyndle Herrlich, MD;  Location: [facility] ORS;  Service: Orthopedics;  Laterality: Left;     Current Outpatient Medications  Medication Sig Dispense Refill   acetaminophen (TYLENOL) 500 MG tablet Take one to two if needed     AIRSUPRA 90-80 MCG/ACT AERO Inhale 2 puffs into the lungs every 4 (four) hours as needed. 10.7 g 1   albuterol (PROVENTIL) (2.5 MG/3ML) 0.083% nebulizer solution Take 3 mLs (2.5 mg total) by nebulization every 4 (four) hours as needed for wheezing or shortness of breath. 75 mL 1   albuterol (VENTOLIN HFA) 108 (90 Base) MCG/ACT inhaler Inhale 2 puffs into the lungs every 4 (four) hours as needed for wheezing or shortness of  breath. 18 g 1   alendronate (FOSAMAX) 70 MG tablet Take by mouth.     amLODipine (NORVASC) 5 MG tablet Take by mouth.     azelastine (ASTELIN) 0.1 % nasal spray USE ONE SPRAY IN EACH NOSTRIL TWICE DAILY X 1 WEEK.     BD DISP NEEDLES 25G X 5/8" MISC USE AS DIRECTED TO ADMINISTER XOLAIR 3 each 11   Budeson-Glycopyrrol-Formoterol (BREZTRI AEROSPHERE) 160-9-4.8 MCG/ACT AERO Inhale 2 puffs into the lungs in the morning and at bedtime. 10.7 g 5   Calcium Carbonate-Vitamin D (CALCIUM 600+D PO) Take 1 tablet by mouth daily.     cetirizine (ZYRTEC) 10 MG tablet TAKE 1 TABLET BY MOUTH EVERY DAY 90 tablet 1   Cholecalciferol (VITAMIN D-3) 125 MCG (5000 UT) TABS Take 5,000 Units by mouth daily.      Coenzyme Q10 100 MG capsule Take by mouth.     Cyanocobalamin (VITAMIN B-12 PO) Take 3,000 mcg by mouth daily.      EPINEPHrine (EPIPEN 2-PAK) 0.3 mg/0.3 mL IJ SOAJ injection Inject 0.3 mg into the muscle as needed for anaphylaxis. 0.3 mL 1   fexofenadine (ALLEGRA) 180 MG tablet Take 1 tablet (  180 mg total) by mouth daily. 90 tablet 1   ipratropium-albuterol (DUONEB) 0.5-2.5 (3) MG/3ML SOLN Take 3 mLs by nebulization every 6 (six) hours as needed. 100 mL 1   metFORMIN (GLUCOPHAGE) 500 MG tablet TAKE 1 TABLET BY MOUTH TWICE A DAY (Patient taking differently: Take 500 mg by mouth 1 day or 1 dose.) 180 tablet 3   metoprolol succinate (TOPROL-XL) 50 MG 24 hr tablet TAKE 1 TABLET BY MOUTH EVERY DAY 90 tablet 0   montelukast (SINGULAIR) 10 MG tablet TAKE 1 TABLET BY MOUTH EVERYDAY AT BEDTIME 30 tablet 4   omeprazole (PRILOSEC OTC) 20 MG tablet Take 1 tablet (20 mg total) by mouth daily. 30 tablet 5   rosuvastatin (CRESTOR) 20 MG tablet TAKE 1 TABLET BY MOUTH EVERY DAY 90 tablet 0   triamcinolone (NASACORT) 55 MCG/ACT AERO nasal inhaler Place 2 sprays into the nose daily. 16.5 g 5   valsartan-hydrochlorothiazide (DIOVAN-HCT) 80-12.5 MG tablet TAKE 1 TABLET BY MOUTH EVERY DAY 90 tablet 1   JARDIANCE 10 MG TABS tablet  TAKE 1 TABLET BY MOUTH EVERY DAY 30 tablet 3   rivaroxaban (XARELTO) 10 MG TABS tablet Xarelto 10 mg tablet  TAKE 1 TABLET (10 MG TOTAL) BY MOUTH DAILY WITH BREAKFAST.     Current Facility-Administered Medications  Medication Dose Route Frequency Provider Last Rate Last Admin   tezepelumab-ekko (TEZSPIRE) 210 MG/1. syringe 210 mg  210 mg Subcutaneous Q28 days Jessica Priest, MD   210 mg at 07/21/23 1111    Allergies:   Aspirin, Ibuprofen, Other, Shellfish allergy, Shellfish-derived products, Bee venom, Iodine, and Lisinopril    Social History:   reports that she has never smoked. She has never used smokeless tobacco. She reports that she does not drink alcohol and does not use drugs.   Family History:  family history is not on file.    ROS:     Review of Systems  Constitutional: Negative.   HENT: Negative.    Eyes: Negative.   Respiratory: Negative.    Gastrointestinal: Negative.   Genitourinary: Negative.   Musculoskeletal: Negative.   Skin: Negative.   Neurological: Negative.   Endo/Heme/Allergies: Negative.   Psychiatric/Behavioral: Negative.    All other systems reviewed and are negative.     All other systems are reviewed and negative.    PHYSICAL EXAM: VS:  BP 102/70   Pulse 82   Ht 5\' 3"  (1.6 m)   Wt 178 lb 12.8 oz (81.1 kg)   SpO2 95%   BMI 31.67 kg/m  , BMI Body mass index is 31.67 kg/m. Last weight:  Wt Readings from Last 3 Encounters:  08/07/23 178 lb 12.8 oz (81.1 kg)  05/25/23 184 lb 3.2 oz (83.6 kg)  05/07/23 186 lb (84.4 kg)     Physical Exam Constitutional:      Appearance: Normal appearance.  Cardiovascular:     Rate and Rhythm: Normal rate and regular rhythm.     Heart sounds: Normal heart sounds.  Pulmonary:     Effort: Pulmonary effort is normal.     Breath sounds: Normal breath sounds.  Musculoskeletal:     Right lower leg: No edema.     Left lower leg: No edema.  Neurological:     Mental Status: She is alert.       EKG:    Recent Labs: 05/25/2023: ALT 17; BUN 12; Creatinine, Ser 1.10; Potassium 4.3; Sodium 140; TSH 1.730    Lipid Panel    Component Value Date/Time  CHOL 135 05/25/2023 0936   TRIG 86 05/25/2023 0936   HDL 72 05/25/2023 0936   CHOLHDL 1.9 05/25/2023 0936   LDLCALC 47 05/25/2023 0936      Other studies Reviewed: Additional studies/ records that were reviewed today include:  Review of the above records demonstrates:       No data to display            ASSESSMENT AND PLAN:    ICD-10-CM   1. Essential hypertension, benign  I10    BP good    2. Chest pain, non-cardiac  R07.89     3. Mixed hyperlipidemia  E78.2     4. Nonrheumatic mitral valve regurgitation  I34.0     5. Other chest pain  R07.89    infrequent. Stress test mild inferior ischaemia, but ffels fine. If chest pain CCTA    6. Dizziness  R42    Resolved       Problem List Items Addressed This Visit   None Visit Diagnoses     Essential hypertension, benign    -  Primary   BP good   Chest pain, non-cardiac       Mixed hyperlipidemia       Nonrheumatic mitral valve regurgitation       Other chest pain       infrequent. Stress test mild inferior ischaemia, but ffels fine. If chest pain CCTA   Dizziness       Resolved          Disposition:   Return in about 3 months (around 11/07/2023).    Total time spent: 30 minutes  Signed,  Adrian Blackwater, MD  08/07/2023 9:43 AM    [facility]

## 2023-08-15 ENCOUNTER — Other Ambulatory Visit: Payer: Self-pay | Admitting: Family

## 2023-08-15 ENCOUNTER — Other Ambulatory Visit: Payer: Self-pay | Admitting: Cardiovascular Disease

## 2023-08-15 ENCOUNTER — Other Ambulatory Visit: Payer: Self-pay | Admitting: Allergy and Immunology

## 2023-08-15 DIAGNOSIS — E785 Hyperlipidemia, unspecified: Secondary | ICD-10-CM

## 2023-08-18 ENCOUNTER — Ambulatory Visit: Payer: Federal, State, Local not specified - PPO

## 2023-08-18 ENCOUNTER — Encounter: Payer: Self-pay | Admitting: Allergy and Immunology

## 2023-08-18 ENCOUNTER — Ambulatory Visit: Payer: Medicare PPO | Admitting: Allergy and Immunology

## 2023-08-18 ENCOUNTER — Other Ambulatory Visit: Payer: Self-pay

## 2023-08-18 VITALS — BP 126/74 | HR 62 | Temp 98.0°F | Resp 18 | Ht 63.0 in | Wt 177.7 lb

## 2023-08-18 DIAGNOSIS — H6993 Unspecified Eustachian tube disorder, bilateral: Secondary | ICD-10-CM | POA: Diagnosis not present

## 2023-08-18 DIAGNOSIS — J3089 Other allergic rhinitis: Secondary | ICD-10-CM | POA: Diagnosis not present

## 2023-08-18 DIAGNOSIS — J324 Chronic pansinusitis: Secondary | ICD-10-CM

## 2023-08-18 DIAGNOSIS — Z8669 Personal history of other diseases of the nervous system and sense organs: Secondary | ICD-10-CM

## 2023-08-18 DIAGNOSIS — K219 Gastro-esophageal reflux disease without esophagitis: Secondary | ICD-10-CM | POA: Diagnosis not present

## 2023-08-18 DIAGNOSIS — J4550 Severe persistent asthma, uncomplicated: Secondary | ICD-10-CM | POA: Diagnosis not present

## 2023-08-18 MED ORDER — EPINEPHRINE 0.3 MG/0.3ML IJ SOAJ: 0.3000 mg | INTRAMUSCULAR | 2 refills | Status: DC | PRN

## 2023-08-18 MED ORDER — MONTELUKAST SODIUM 10 MG PO TABS: 10.0000 mg | ORAL_TABLET | Freq: Every day | ORAL | 1 refills | Status: DC

## 2023-08-18 MED ORDER — TRIAMCINOLONE ACETONIDE 55 MCG/ACT NA AERO: 2.0000 | INHALATION_SPRAY | Freq: Every day | NASAL | 5 refills | Status: DC

## 2023-08-18 MED ORDER — OMEPRAZOLE MAGNESIUM 20 MG PO TBEC: 20.0000 mg | DELAYED_RELEASE_TABLET | Freq: Every day | ORAL | 5 refills | Status: DC

## 2023-08-18 MED ORDER — BREZTRI AEROSPHERE 160-9-4.8 MCG/ACT IN AERO: 2.0000 | INHALATION_SPRAY | Freq: Two times a day (BID) | RESPIRATORY_TRACT | 5 refills | Status: DC

## 2023-08-18 NOTE — Progress Notes (Unsigned)
[name] - [place] - [name] - [name]   Follow-up Note  Referring Provider: Miki Kins, FNP Primary Provider: Miki Kins, FNP Date of Office Visit: 08/18/2023  Subjective:   Anne Kerr (DOB: September 18, 1951) is a 72 y.o. female who [facility] in re-evaluation of the following:  HPI: [name] returns to this clinic in evaluation of asthma, allergic rhinitis, chronic sinusitis, chronic mastoiditis, reflux, food allergy directed against shellfish.  I last saw her in this clinic 17 Feb 2023.  She is really doing well while using anti-TSLP antibody to address her respiratory tract inflammation and she does not need to use any Breztri and she does not need to use any Airsupra and she is doing very well with her upper airways while using some Nasacort and some nasal washes and she has not required a systemic steroid or an antibiotic for any type of airway issue.  And her reflux is under very good control using omeprazole.  However, for the past 3 weeks she has had ear fullness and a little bit more ringing in her years.  She feels some popping and some squishiness in her ears.  She does not consume shellfish.  Allergies as of 08/18/2023       Reactions   Aspirin Other (See Comments)   Wheeze Wheeze  Wheeze  Wheeze GI irritation Wheeze, Wheeze, Wheeze   Ibuprofen Cough, Other (See Comments), Shortness Of Breath, Itching   Wheeze Wheeze  Wheeze  Wheeze Bronchial tubes swelled up Wheeze, Wheeze, Wheeze   Other Other (See Comments), Swelling   Bee Sting Asthma Symptoms Bee Sting  Bee Sting  Asthma Symptoms  Asthma Symptoms   Shellfish Allergy Other (See Comments)   Asthma Symptoms   Shellfish-derived Products Other (See Comments)   Asthma Symptoms Swelling of bronchial tubes   Bee Venom Swelling   Iodine Other (See Comments)   ASTHMA SYMPTOMS ASTHMA SYMPTOMS  ASTHMA SYMPTOMS ASTHMA SYMPTOMS,  ASTHMA SYMPTOMS   Lisinopril         Medication List    acetaminophen 500 MG tablet Commonly known as: TYLENOL Take one to two if needed   Airsupra 90-80 MCG/ACT Aero Generic drug: Albuterol-Budesonide Inhale 2 puffs into the lungs every 4 (four) hours as needed.   albuterol (2.5 MG/3ML) 0.083% nebulizer solution Commonly known as: PROVENTIL Take 3 mLs (2.5 mg total) by nebulization every 4 (four) hours as needed for wheezing or shortness of breath.   albuterol 108 (90 Base) MCG/ACT inhaler Commonly known as: VENTOLIN HFA Inhale 2 puffs into the lungs every 4 (four) hours as needed for wheezing or shortness of breath.   alendronate 70 MG tablet Commonly known as: FOSAMAX Take by mouth.   amLODipine 5 MG tablet Commonly known as: NORVASC Take by mouth.   azelastine 0.1 % nasal spray Commonly known as: ASTELIN USE ONE SPRAY IN EACH NOSTRIL TWICE DAILY X 1 WEEK.   BD Disp Needles 25G X 5/8" Misc Generic drug: NEEDLE (DISP) 25 G USE AS DIRECTED TO ADMINISTER [name] Aerosphere 160-9-4.8 MCG/ACT Aero Generic drug: Budeson-Glycopyrrol-Formoterol Inhale 2 puffs into the lungs in the morning and at bedtime.   CALCIUM 600+D PO Take 1 tablet by mouth daily.   cetirizine 10 MG tablet Commonly known as: ZYRTEC TAKE 1 TABLET BY MOUTH EVERY DAY   Coenzyme Q10 100 MG capsule Take by mouth.   EPINEPHrine 0.3 mg/0.3 mL Soaj injection Commonly known as:  EpiPen 2-Pak Inject 0.3 mg into the muscle as needed for anaphylaxis.   fexofenadine 180 MG tablet Commonly known as: ALLEGRA Take 1 tablet (180 mg total) by mouth daily.   ipratropium-albuterol 0.5-2.5 (3) MG/3ML Soln Commonly known as: DUONEB Take 3 mLs by nebulization every 6 (six) hours as needed.   Jardiance 10 MG Tabs tablet Generic drug: empagliflozin TAKE 1 TABLET BY MOUTH EVERY DAY   metFORMIN 500 MG tablet Commonly known as: GLUCOPHAGE TAKE 1 TABLET BY MOUTH TWICE A DAY What changed: when to  take this   metoprolol succinate 50 MG 24 hr tablet Commonly known as: TOPROL-XL TAKE 1 TABLET BY MOUTH EVERY DAY   montelukast 10 MG tablet Commonly known as: SINGULAIR TAKE 1 TABLET BY MOUTH EVERYDAY AT BEDTIME   omeprazole 20 MG capsule Commonly known as: PRILOSEC TAKE 1 CAPSULE BY MOUTH EVERY DAY   omeprazole 20 MG tablet Commonly known as: PriLOSEC OTC Take 1 tablet (20 mg total) by mouth daily.   rivaroxaban 10 MG Tabs tablet Commonly known as: XARELTO Xarelto 10 mg tablet  TAKE 1 TABLET (10 MG TOTAL) BY MOUTH DAILY WITH BREAKFAST.   rosuvastatin 20 MG tablet Commonly known as: CRESTOR TAKE 1 TABLET BY MOUTH EVERY DAY   triamcinolone 55 MCG/ACT Aero nasal inhaler Commonly known as: NASACORT Place 2 sprays into the nose daily.   valsartan-hydrochlorothiazide 80-12.5 MG tablet Commonly known as: DIOVAN-HCT TAKE 1 TABLET BY MOUTH EVERY DAY   VITAMIN B-12 PO Take 3,000 mcg by mouth daily.   Vitamin D-3 125 MCG (5000 UT) Tabs Take 5,000 Units by mouth daily.    Past Medical History:  Diagnosis Date   Acute non-recurrent maxillary sinusitis 07/25/2015   Arthritis    Asthma    allergy induced   Complication of anesthesia    hard to wake up   Diabetes mellitus without complication (HCC)    GERD (gastroesophageal reflux disease)    High blood pressure    History of ear surgery 05/01/2021   History of kidney stones    h/o   Hyperlipidemia    Leaky heart valve    Leaky heart valve    Pain 07/09/2016   Pain in left knee 04/27/2019   Tachycardia    TIA (transient ischemic attack)    no residual side effects-pt states she didnt even know- a doctor told her this   Uterine cancer (HCC) 1989    Past Surgical History:  Procedure Laterality Date   COLONOSCOPY     KNEE ARTHROSCOPY Right    NASAL SINUS SURGERY     x 4   PARTIAL HYSTERECTOMY     SINOSCOPY     TOTAL KNEE ARTHROPLASTY Left 05/11/2019   Procedure: TOTAL KNEE ARTHROPLASTY;  Surgeon: Lyndle Herrlich, MD;  Location: [facility] OR[facility];  Service: Orthopedics;  Laterality: Left;    Review of systems negative except as noted in HPI / PMHx or noted below:  Review of Systems  Constitutional: Negative.   HENT: Negative.    Eyes: Negative.   Respiratory: Negative.    Cardiovascular: Negative.   Gastrointestinal: Negative.   Genitourinary: Negative.   Musculoskeletal: Negative.   Skin: Negative.   Neurological: Negative.   Endo/Heme/Allergies: Negative.   Psychiatric/Behavioral: Negative.       Objective:   Vitals:   08/18/23 1100  BP: 126/74  Pulse: 62  Resp: 18  Temp: 98 F (36.7 C)  SpO2: 96%   Height: 5\' 3"  (160 cm)  Weight: 177 lb  11.2 oz (80.6 kg)   Physical Exam Constitutional:      Appearance: She is not diaphoretic.  HENT:     Head: Normocephalic.     Right Ear: Ear canal and external ear normal. A middle ear effusion is present. Tympanic membrane is scarred.     Left Ear: Ear canal and external ear normal. A middle ear effusion is present. Tympanic membrane is scarred.     Nose: Nose normal. No mucosal edema or rhinorrhea.     Mouth/Throat:     Pharynx: Uvula midline. No oropharyngeal exudate.  Eyes:     Conjunctiva/sclera: Conjunctivae normal.  Neck:     Thyroid: No thyromegaly.     Trachea: Trachea normal. No tracheal tenderness or tracheal deviation.  Cardiovascular:     Rate and Rhythm: Normal rate and regular rhythm.     Heart sounds: Normal heart sounds, S1 normal and S2 normal. No murmur heard. Pulmonary:     Effort: No respiratory distress.     Breath sounds: Normal breath sounds. No stridor. No wheezing or rales.  Lymphadenopathy:     Head:     Right side of head: No tonsillar adenopathy.     Left side of head: No tonsillar adenopathy.     Cervical: No cervical adenopathy.  Skin:    Findings: No erythema or rash.     Nails: There is no clubbing.  Neurological:     Mental Status: She is alert.     Diagnostics: Spirometry was performed  and demonstrated an FEV1 of 1.39 at 79 % of predicted.   Assessment and Plan:   1. Asthma, severe persistent, well-controlled   2. Other allergic rhinitis   3. Chronic pansinusitis   4. History of mastoiditis   5. Gastroesophageal reflux disease, unspecified whether esophagitis present   6. Dysfunction of both eustachian tubes    1. Continue Tezepelumab injections every 4 weeks   2. Continue Breztri - 2 inhalations 1-2 times per day with spacer   3. Continue Nasacort one spray each nostril 1-2 times per day  4. Continue omeprazole 20 mg 1 time per day  5. If needed:   A.  Nasal saline multiple times per day  B.  AirSupra -2 inhalations every 4-6 hours   C.  Duoneb - nebulize every 4-6 hours D.  Epi-Pen  6. For this recent ear event:   A. Prednisone 10 mg - 1 tablet 1 time per day for 10 days (samples)  7. Return to clinic in 6 months or earlier if needed  Saidie appears to have some fluid in her ears which is not a surprise given her history of chronic mastoiditis and it is a little bit worse than it has been in the past and we will treat her with some omeprazole to hopefully open up her eustachian tubes and drain some of her middle ear.  Her airway issue is actually going quite well while using tezepelumab as her major controller agent and she has been able to taper off her Markus Daft for the most part.  She will continue on some upper airway anti-inflammatory medications as noted above and also continue to use omeprazole for her reflux.  If she does well I will see her back in this clinic in 6 months or earlier if there is a problem.   Laurette Schimke, MD Allergy / Immunology [facility]

## 2023-08-18 NOTE — Patient Instructions (Addendum)
  1. Continue Tezepelumab injections every 4 weeks   2. Continue Breztri - 2 inhalations 1-2 times per day with spacer   3. Continue Nasacort one spray each nostril 1-2 times per day  4. Continue omeprazole 20 mg 1 time per day  5. If needed:   A.  Nasal saline multiple times per day  B.  AirSupra -2 inhalations every 4-6 hours   C.  Duoneb - nebulize every 4-6 hours D.  Epi-Pen  6. For this recent ear event:   A. Prednisone 10 mg - 1 tablet 1 time per day for 10 days (samples)  7. Return to clinic in 6 months or earlier if needed

## 2023-08-19 ENCOUNTER — Encounter: Payer: Self-pay | Admitting: Allergy and Immunology

## 2023-08-25 ENCOUNTER — Ambulatory Visit: Payer: Medicare Other | Admitting: Cardiology

## 2023-08-25 ENCOUNTER — Encounter: Payer: Self-pay | Admitting: Cardiology

## 2023-08-25 ENCOUNTER — Ambulatory Visit (INDEPENDENT_AMBULATORY_CARE_PROVIDER_SITE_OTHER): Payer: Medicare Other | Admitting: Cardiology

## 2023-08-25 VITALS — BP 112/76 | HR 67 | Ht 63.0 in | Wt 177.0 lb

## 2023-08-25 DIAGNOSIS — Z0000 Encounter for general adult medical examination without abnormal findings: Secondary | ICD-10-CM | POA: Diagnosis not present

## 2023-08-25 DIAGNOSIS — E1165 Type 2 diabetes mellitus with hyperglycemia: Secondary | ICD-10-CM | POA: Diagnosis not present

## 2023-08-25 DIAGNOSIS — Z794 Long term (current) use of insulin: Secondary | ICD-10-CM | POA: Diagnosis not present

## 2023-08-25 DIAGNOSIS — I10 Essential (primary) hypertension: Secondary | ICD-10-CM

## 2023-08-25 NOTE — Progress Notes (Signed)
Established Patient Office Visit  Subjective:  Patient ID: Anne Kerr, female    DOB: 1950/11/11  Age: 72 y.o. MRN: [id number]  Chief Complaint  Patient presents with   Annual Exam    AWV    Patient in office for medicare wellness exam.     No other concerns at this time.   Past Medical History:  Diagnosis Date   Acute non-recurrent maxillary sinusitis 07/25/2015   Arthritis    Asthma    allergy induced   Complication of anesthesia    hard to wake up   Diabetes mellitus without complication (HCC)    GERD (gastroesophageal reflux disease)    High blood pressure    History of ear surgery 05/01/2021   History of kidney stones    h/o   Hyperlipidemia    Leaky heart valve    Leaky heart valve    Pain 07/09/2016   Pain in left knee 04/27/2019   Tachycardia    TIA (transient ischemic attack)    no residual side effects-pt states she didnt even know- a doctor told her this   Uterine cancer (HCC) 1989    Past Surgical History:  Procedure Laterality Date   COLONOSCOPY     KNEE ARTHROSCOPY Right    NASAL SINUS SURGERY     x 4   PARTIAL HYSTERECTOMY     SINOSCOPY     TOTAL KNEE ARTHROPLASTY Left 05/11/2019   Procedure: TOTAL KNEE ARTHROPLASTY;  Surgeon: Lyndle Herrlich, MD;  Location: [facility] ORS;  Service: Orthopedics;  Laterality: Left;    Social History   Socioeconomic History   Marital status: Married    Spouse name: Not on file   Number of children: Not on file   Years of education: Not on file   Highest education level: Not on file  Occupational History   Not on file  Tobacco Use   Smoking status: Never   Smokeless tobacco: Never  Vaping Use   Vaping status: Never Used  Substance and Sexual Activity   Alcohol use: No    Alcohol/week: 0.0 standard drinks of alcohol   Drug use: No   Sexual activity: Not on file  Other Topics Concern   Not on file  Social History Narrative   Not on file   Social Determinants of Health   Financial Resource  Strain: Not on file  Food Insecurity: Not on file  Transportation Needs: Not on file  Physical Activity: Low Risk  (06/16/2023)   Received from [facility]   PCARE Exercise SDOH    Exercise: Aerobic    PCare Exercise SDOH: Not on file    Weight Bearing Exercises: No  Stress: Not on file  Social Connections: Not on file  Intimate Partner Violence: Not on file    Family History  Problem Relation Age of Onset   Allergic rhinitis Neg Hx    Angioedema Neg Hx    Asthma Neg Hx    Atopy Neg Hx    Eczema Neg Hx    Immunodeficiency Neg Hx    Urticaria Neg Hx     Allergies  Allergen Reactions   Aspirin Other (See Comments)    Wheeze  Wheeze  Wheeze  Wheeze  GI irritation  Wheeze, Wheeze, Wheeze   Ibuprofen Cough, Other (See Comments), Shortness Of Breath and Itching    Wheeze  Wheeze  Wheeze  Wheeze  Bronchial tubes swelled up  Wheeze, Wheeze, Wheeze   Other Other (See  Comments) and Swelling    Bee Sting  Asthma Symptoms  Bee Sting  Bee Sting  Asthma Symptoms  Asthma Symptoms   Shellfish Allergy Other (See Comments)    Asthma Symptoms   Shellfish-Derived Products Other (See Comments)    Asthma Symptoms  Swelling of bronchial tubes   Bee Venom Swelling   Iodine Other (See Comments)    ASTHMA SYMPTOMS  ASTHMA SYMPTOMS  ASTHMA SYMPTOMS  ASTHMA SYMPTOMS, ASTHMA SYMPTOMS   Lisinopril     Outpatient Medications Prior to Visit  Medication Sig   acetaminophen (TYLENOL) 500 MG tablet Take one to two if needed   AIRSUPRA 90-80 MCG/ACT AERO Inhale 2 puffs into the lungs every 4 (four) hours as needed.   albuterol (PROVENTIL) (2.5 MG/3ML) 0.083% nebulizer solution Take 3 mLs (2.5 mg total) by nebulization every 4 (four) hours as needed for wheezing or shortness of breath.   albuterol (VENTOLIN HFA) 108 (90 Base) MCG/ACT inhaler Inhale 2 puffs into the lungs every 4 (four) hours as needed for wheezing or shortness of breath.   alendronate (FOSAMAX)  70 MG tablet Take by mouth.   amLODipine (NORVASC) 5 MG tablet Take by mouth.   azelastine (ASTELIN) 0.1 % nasal spray USE ONE SPRAY IN EACH NOSTRIL TWICE DAILY X 1 WEEK.   BD DISP NEEDLES 25G X 5/8" MISC USE AS DIRECTED TO ADMINISTER Geoffry Paradise   Budeson-Glycopyrrol-Formoterol (BREZTRI AEROSPHERE) 160-9-4.8 MCG/ACT AERO Inhale 2 puffs into the lungs in the morning and at bedtime.   Calcium Carbonate-Vitamin D (CALCIUM 600+D PO) Take 1 tablet by mouth daily.   cetirizine (ZYRTEC) 10 MG tablet TAKE 1 TABLET BY MOUTH EVERY DAY   Cholecalciferol (VITAMIN D-3) 125 MCG (5000 UT) TABS Take 5,000 Units by mouth daily.    Coenzyme Q10 100 MG capsule Take by mouth.   Cyanocobalamin (VITAMIN B-12 PO) Take 3,000 mcg by mouth daily.    fexofenadine (ALLEGRA) 180 MG tablet Take 1 tablet (180 mg total) by mouth daily.   ipratropium-albuterol (DUONEB) 0.5-2.5 (3) MG/3ML SOLN Take 3 mLs by nebulization every 6 (six) hours as needed.   JARDIANCE 10 MG TABS tablet TAKE 1 TABLET BY MOUTH EVERY DAY   metFORMIN (GLUCOPHAGE) 500 MG tablet TAKE 1 TABLET BY MOUTH TWICE A DAY (Patient taking differently: Take 500 mg by mouth 1 day or 1 dose.)   metoprolol succinate (TOPROL-XL) 50 MG 24 hr tablet TAKE 1 TABLET BY MOUTH EVERY DAY   omeprazole (PRILOSEC OTC) 20 MG tablet Take 1 tablet (20 mg total) by mouth daily.   rivaroxaban (XARELTO) 10 MG TABS tablet Xarelto 10 mg tablet  TAKE 1 TABLET (10 MG TOTAL) BY MOUTH DAILY WITH BREAKFAST.   rosuvastatin (CRESTOR) 20 MG tablet TAKE 1 TABLET BY MOUTH EVERY DAY   triamcinolone (NASACORT) 55 MCG/ACT AERO nasal inhaler Place 2 sprays into the nose daily.   valsartan-hydrochlorothiazide (DIOVAN-HCT) 80-12.5 MG tablet TAKE 1 TABLET BY MOUTH EVERY DAY   EPINEPHrine (EPIPEN 2-PAK) 0.3 mg/0.3 mL IJ SOAJ injection Inject 0.3 mg into the muscle as needed for anaphylaxis. (Patient not taking: Reported on 08/25/2023)   [DISCONTINUED] montelukast (SINGULAIR) 10 MG tablet Take 1 tablet (10 mg  total) by mouth at bedtime. (Patient not taking: Reported on 08/25/2023)   [DISCONTINUED] omeprazole (PRILOSEC) 20 MG capsule TAKE 1 CAPSULE BY MOUTH EVERY DAY (Patient not taking: Reported on 08/25/2023)   Facility-Administered Medications Prior to Visit  Medication Dose Route Frequency Provider   tezepelumab-ekko (TEZSPIRE) 210 MG/1. syringe 210 mg  210 mg Subcutaneous Q28 days Kozlow, Alvira Philips, MD    Review of Systems  Constitutional: Negative.   HENT: Negative.    Eyes: Negative.   Respiratory: Negative.  Negative for shortness of breath.   Cardiovascular: Negative.  Negative for chest pain.  Gastrointestinal: Negative.  Negative for abdominal pain, constipation and diarrhea.  Genitourinary: Negative.   Musculoskeletal:  Negative for joint pain and myalgias.  Skin: Negative.   Neurological: Negative.  Negative for dizziness and headaches.  Endo/Heme/Allergies: Negative.   All other systems reviewed and are negative.      Objective:   BP 112/76   Pulse 67   Ht 5\' 3"  (1.6 m)   Wt 177 lb (80.3 kg)   SpO2 96%   BMI 31.35 kg/m   Vitals:   08/25/23 1327  BP: 112/76  Pulse: 67  Height: 5\' 3"  (1.6 m)  Weight: 177 lb (80.3 kg)  SpO2: 96%  BMI (Calculated): 31.36    Physical Exam Vitals and nursing note reviewed.  Constitutional:      Appearance: Normal appearance. She is normal weight.  HENT:     Head: Normocephalic and atraumatic.     Nose: Nose normal.     Mouth/Throat:     Mouth: Mucous membranes are moist.  Eyes:     Extraocular Movements: Extraocular movements intact.     Conjunctiva/sclera: Conjunctivae normal.     Pupils: Pupils are equal, round, and reactive to light.  Cardiovascular:     Rate and Rhythm: Normal rate and regular rhythm.     Pulses: Normal pulses.     Heart sounds: Normal heart sounds.  Pulmonary:     Effort: Pulmonary effort is normal.     Breath sounds: Normal breath sounds.  Abdominal:     General: Abdomen is flat. Bowel sounds  are normal.     Palpations: Abdomen is soft.  Musculoskeletal:        General: Normal range of motion.     Cervical back: Normal range of motion.  Skin:    General: Skin is warm and dry.  Neurological:     General: No focal deficit present.     Mental Status: She is alert and oriented to person, place, and time.  Psychiatric:        Mood and Affect: Mood normal.        Behavior: Behavior normal.        Thought Content: Thought content normal.        Judgment: Judgment normal.      No results found for any visits on 08/25/23.  No results found for this or any previous visit (from the past 2160 hour(s)).    Assessment & Plan:   Problem List Items Addressed This Visit       Endocrine   Type 2 diabetes mellitus with hyperglycemia (HCC)   Other Visit Diagnoses     Encounter for annual health examination    -  Primary   Essential hypertension, benign           Return in about 3 months (around 11/25/2023) for with Marchelle Folks.   Total time spent: 25 minutes  [employer], NP  08/25/2023   This document may have been prepared by Dragon Voice Recognition software and as such may include unintentional dictation errors.

## 2023-09-09 ENCOUNTER — Other Ambulatory Visit: Payer: Self-pay | Admitting: Family

## 2023-09-09 DIAGNOSIS — I10 Essential (primary) hypertension: Secondary | ICD-10-CM

## 2023-09-15 ENCOUNTER — Ambulatory Visit (INDEPENDENT_AMBULATORY_CARE_PROVIDER_SITE_OTHER): Payer: Medicare PPO | Admitting: *Deleted

## 2023-09-15 DIAGNOSIS — J4550 Severe persistent asthma, uncomplicated: Secondary | ICD-10-CM | POA: Diagnosis not present

## 2023-10-13 ENCOUNTER — Ambulatory Visit (INDEPENDENT_AMBULATORY_CARE_PROVIDER_SITE_OTHER): Payer: Medicare HMO | Admitting: *Deleted

## 2023-10-13 DIAGNOSIS — J4550 Severe persistent asthma, uncomplicated: Secondary | ICD-10-CM

## 2023-11-09 ENCOUNTER — Encounter: Payer: Self-pay | Admitting: Cardiovascular Disease

## 2023-11-09 ENCOUNTER — Ambulatory Visit (INDEPENDENT_AMBULATORY_CARE_PROVIDER_SITE_OTHER): Payer: Medicare HMO | Admitting: Cardiovascular Disease

## 2023-11-09 VITALS — BP 108/70 | HR 67 | Ht 63.0 in | Wt 172.6 lb

## 2023-11-09 DIAGNOSIS — I259 Chronic ischemic heart disease, unspecified: Secondary | ICD-10-CM

## 2023-11-09 DIAGNOSIS — I10 Essential (primary) hypertension: Secondary | ICD-10-CM | POA: Diagnosis not present

## 2023-11-09 DIAGNOSIS — R0602 Shortness of breath: Secondary | ICD-10-CM

## 2023-11-09 DIAGNOSIS — I340 Nonrheumatic mitral (valve) insufficiency: Secondary | ICD-10-CM

## 2023-11-09 DIAGNOSIS — E782 Mixed hyperlipidemia: Secondary | ICD-10-CM | POA: Diagnosis not present

## 2023-11-09 DIAGNOSIS — R0789 Other chest pain: Secondary | ICD-10-CM | POA: Diagnosis not present

## 2023-11-09 NOTE — Progress Notes (Signed)
 Cardiology Office Note   Date:  11/09/2023   ID:  Anne Kerr, DOB [birth year], MRN [id number]  PCP:  Trenda Frisk, FNP  Cardiologist:  Debborah Fairly, MD      History of Present Illness: Anne Kerr is a 73 y.o. female who presents for  Chief Complaint  Patient presents with   Follow-up    3 month follow up    Has SOB and chest paibns  Shortness of Breath This is a chronic problem. The current episode started more than 1 month ago. The problem has been waxing and waning. Associated symptoms include chest pain. The treatment provided moderate relief.      Past Medical History:  Diagnosis Date   Acute non-recurrent maxillary sinusitis 07/25/2015   Arthritis    Asthma    allergy induced   Complication of anesthesia    hard to wake up   Diabetes mellitus without complication (HCC)    GERD (gastroesophageal reflux disease)    High blood pressure    History of ear surgery 05/01/2021   History of kidney stones    h/o   Hyperlipidemia    Leaky heart valve    Leaky heart valve    Pain 07/09/2016   Pain in left knee 04/27/2019   Tachycardia    TIA (transient ischemic attack)    no residual side effects-pt states she didnt even know- a doctor told her this   Uterine cancer (HCC) 1989     Past Surgical History:  Procedure Laterality Date   COLONOSCOPY     KNEE ARTHROSCOPY Right    NASAL SINUS SURGERY     x 4   PARTIAL HYSTERECTOMY     SINOSCOPY     TOTAL KNEE ARTHROPLASTY Left 05/11/2019   Procedure: TOTAL KNEE ARTHROPLASTY;  Surgeon: Jerlyn Moons, MD;  Location: [facility] ORS;  Service: Orthopedics;  Laterality: Left;     Current Outpatient Medications  Medication Sig Dispense Refill   acetaminophen  (TYLENOL ) 500 MG tablet Take one to two if needed     AIRSUPRA  90-80 MCG/ACT AERO Inhale 2 puffs into the lungs every 4 (four) hours as needed. 10.7 g 1   albuterol  (PROVENTIL ) (2.5 MG/3ML) 0.083% nebulizer solution Take 3 mLs (2.5 mg total) by  nebulization every 4 (four) hours as needed for wheezing or shortness of breath. 75 mL 1   albuterol  (VENTOLIN  HFA) 108 (90 Base) MCG/ACT inhaler Inhale 2 puffs into the lungs every 4 (four) hours as needed for wheezing or shortness of breath. 18 g 1   alendronate (FOSAMAX) 70 MG tablet Take by mouth.     amLODipine (NORVASC) 5 MG tablet Take by mouth.     azelastine (ASTELIN) 0.1 % nasal spray USE ONE SPRAY IN EACH NOSTRIL TWICE DAILY X 1 WEEK.     BD DISP NEEDLES 25G X 5/8" MISC USE AS DIRECTED TO ADMINISTER XOLAIR  3 each 11   Budeson-Glycopyrrol-Formoterol  (BREZTRI  AEROSPHERE) 160-9-4.8 MCG/ACT AERO Inhale 2 puffs into the lungs in the morning and at bedtime. 10.7 g 5   Calcium  Carbonate-Vitamin D  (CALCIUM  600+D PO) Take 1 tablet by mouth daily.     cetirizine  (ZYRTEC ) 10 MG tablet TAKE 1 TABLET BY MOUTH EVERY DAY 90 tablet 1   Cholecalciferol  (VITAMIN D -3) 125 MCG (5000 UT) TABS Take 5,000 Units by mouth daily.      Coenzyme Q10 100 MG capsule Take by mouth.     Cyanocobalamin  (VITAMIN B-12 PO) Take 3,000 mcg by mouth  daily.      EPINEPHrine  (EPIPEN  2-PAK) 0.3 mg/0.3 mL IJ SOAJ injection Inject 0.3 mg into the muscle as needed for anaphylaxis. (Patient not taking: Reported on 08/25/2023) 1 each 2   fexofenadine  (ALLEGRA ) 180 MG tablet Take 1 tablet (180 mg total) by mouth daily. 90 tablet 1   ipratropium-albuterol  (DUONEB) 0.5-2.5 (3) MG/3ML SOLN Take 3 mLs by nebulization every 6 (six) hours as needed. 100 mL 1   JARDIANCE  10 MG TABS tablet TAKE 1 TABLET BY MOUTH EVERY DAY 30 tablet 3   metFORMIN  (GLUCOPHAGE ) 500 MG tablet TAKE 1 TABLET BY MOUTH TWICE A DAY (Patient taking differently: Take 500 mg by mouth 1 day or 1 dose.) 180 tablet 3   metoprolol  succinate (TOPROL -XL) 50 MG 24 hr tablet TAKE 1 TABLET BY MOUTH EVERY DAY 90 tablet 0   omeprazole  (PRILOSEC  OTC) 20 MG tablet Take 1 tablet (20 mg total) by mouth daily. 30 tablet 5   rivaroxaban  (XARELTO ) 10 MG TABS tablet Xarelto  10 mg tablet   TAKE 1 TABLET (10 MG TOTAL) BY MOUTH DAILY WITH BREAKFAST.     rosuvastatin  (CRESTOR ) 20 MG tablet TAKE 1 TABLET BY MOUTH EVERY DAY 90 tablet 0   triamcinolone  (NASACORT ) 55 MCG/ACT AERO nasal inhaler Place 2 sprays into the nose daily. 16.5 g 5   valsartan -hydrochlorothiazide  (DIOVAN -HCT) 80-12.5 MG tablet TAKE 1 TABLET BY MOUTH EVERY DAY 90 tablet 1   Current Facility-Administered Medications  Medication Dose Route Frequency Provider Last Rate Last Admin   tezepelumab -ekko (TEZSPIRE ) 210 MG/1. syringe 210 mg  210 mg Subcutaneous Q28 days Kozlow, Eric J, MD   210 mg at 10/13/23 1049    Allergies:   Aspirin, Ibuprofen, Other, Shellfish allergy, Shellfish-derived products, Bee venom, Iodine , and Lisinopril    Social History:   reports that she has never smoked. She has never used smokeless tobacco. She reports that she does not drink alcohol and does not use drugs.   Family History:  family history is not on file.    ROS:     Review of Systems  Constitutional: Negative.   HENT: Negative.    Eyes: Negative.   Respiratory:  Positive for shortness of breath.   Cardiovascular:  Positive for chest pain.  Gastrointestinal: Negative.   Genitourinary: Negative.   Musculoskeletal: Negative.   Skin: Negative.   Neurological: Negative.   Endo/Heme/Allergies: Negative.   Psychiatric/Behavioral: Negative.    All other systems reviewed and are negative.     All other systems are reviewed and negative.    PHYSICAL EXAM: VS:  BP 108/70   Pulse 67   Ht 5\' 3"  (1.6 m)   Wt 172 lb 9.6 oz (78.3 kg)   SpO2 98%   BMI 30.57 kg/m  , BMI Body mass index is 30.57 kg/m. Last weight:  Wt Readings from Last 3 Encounters:  11/09/23 172 lb 9.6 oz (78.3 kg)  08/25/23 177 lb (80.3 kg)  08/18/23 177 lb 11.2 oz (80.6 kg)     Physical Exam Constitutional:      Appearance: Normal appearance.  Cardiovascular:     Rate and Rhythm: Normal rate and regular rhythm.     Heart sounds: Normal heart  sounds.  Pulmonary:     Effort: Pulmonary effort is normal.     Breath sounds: Normal breath sounds.  Musculoskeletal:     Right lower leg: No edema.     Left lower leg: No edema.  Neurological:     Mental Status: She is  alert.       EKG:   Recent Labs: 05/25/2023: ALT 17; BUN 12; Creatinine, Ser 1.10; Potassium 4.3; Sodium 140; TSH 1.730    Lipid Panel    Component Value Date/Time   CHOL 135 05/25/2023 0936   TRIG 86 05/25/2023 0936   HDL 72 05/25/2023 0936   CHOLHDL 1.9 05/25/2023 0936   LDLCALC 47 05/25/2023 0936      Other studies Reviewed: Additional studies/ records that were reviewed today include:  Review of the above records demonstrates:       No data to display            ASSESSMENT AND PLAN:    ICD-10-CM   1. Chest pain due to myocardial ischemia, unspecified ischemic chest pain type  I25.9 CT CORONARY MORPH W/CTA COR W/SCORE W/CA W/CM &/OR WO/CM    Comprehensive metabolic panel   has occasional chest pai, and inferior ischaemia on stress test 11/24, advise CCTa    2. Essential hypertension, benign  I10 CT CORONARY MORPH W/CTA COR W/SCORE W/CA W/CM &/OR WO/CM    Comprehensive metabolic panel    3. Mixed hyperlipidemia  E78.2 CT CORONARY MORPH W/CTA COR W/SCORE W/CA W/CM &/OR WO/CM    Comprehensive metabolic panel    4. Nonrheumatic mitral valve regurgitation  I34.0 CT CORONARY MORPH W/CTA COR W/SCORE W/CA W/CM &/OR WO/CM    Comprehensive metabolic panel    5. SOB (shortness of breath)  R06.02 CT CORONARY MORPH W/CTA COR W/SCORE W/CA W/CM &/OR WO/CM    Comprehensive metabolic panel   SOB, with DOE    6. Other chest pain  R07.89 CT CORONARY MORPH W/CTA COR W/SCORE W/CA W/CM &/OR WO/CM    Comprehensive metabolic panel       Problem List Items Addressed This Visit   None Visit Diagnoses       Chest pain due to myocardial ischemia, unspecified ischemic chest pain type    -  Primary   has occasional chest pai, and inferior ischaemia on  stress test 11/24, advise CCTa   Relevant Orders   CT CORONARY MORPH W/CTA COR W/SCORE W/CA W/CM &/OR WO/CM   Comprehensive metabolic panel     Essential hypertension, benign       Relevant Orders   CT CORONARY MORPH W/CTA COR W/SCORE W/CA W/CM &/OR WO/CM   Comprehensive metabolic panel     Mixed hyperlipidemia       Relevant Orders   CT CORONARY MORPH W/CTA COR W/SCORE W/CA W/CM &/OR WO/CM   Comprehensive metabolic panel     Nonrheumatic mitral valve regurgitation       Relevant Orders   CT CORONARY MORPH W/CTA COR W/SCORE W/CA W/CM &/OR WO/CM   Comprehensive metabolic panel     SOB (shortness of breath)       SOB, with DOE   Relevant Orders   CT CORONARY MORPH W/CTA COR W/SCORE W/CA W/CM &/OR WO/CM   Comprehensive metabolic panel     Other chest pain       Relevant Orders   CT CORONARY MORPH W/CTA COR W/SCORE W/CA W/CM &/OR WO/CM   Comprehensive metabolic panel          Disposition:   Return in about 4 weeks (around 12/07/2023) for CCTA and f/u.    Total time spent: 30 minutes  Signed,  Debborah Fairly, MD  11/09/2023 9:52 AM    [facility]

## 2023-11-10 ENCOUNTER — Ambulatory Visit: Payer: Medicare HMO | Admitting: *Deleted

## 2023-11-10 ENCOUNTER — Ambulatory Visit: Payer: Medicare HMO

## 2023-11-10 DIAGNOSIS — J4550 Severe persistent asthma, uncomplicated: Secondary | ICD-10-CM | POA: Diagnosis not present

## 2023-11-14 ENCOUNTER — Other Ambulatory Visit: Payer: Self-pay | Admitting: Cardiovascular Disease

## 2023-11-14 DIAGNOSIS — I10 Essential (primary) hypertension: Secondary | ICD-10-CM

## 2023-11-17 ENCOUNTER — Other Ambulatory Visit: Payer: Self-pay

## 2023-11-17 DIAGNOSIS — I10 Essential (primary) hypertension: Secondary | ICD-10-CM

## 2023-11-17 DIAGNOSIS — E1165 Type 2 diabetes mellitus with hyperglycemia: Secondary | ICD-10-CM

## 2023-11-17 DIAGNOSIS — E785 Hyperlipidemia, unspecified: Secondary | ICD-10-CM

## 2023-11-18 MED ORDER — VALSARTAN-HYDROCHLOROTHIAZIDE 80-12.5 MG PO TABS: 1.0000 | ORAL_TABLET | Freq: Every day | ORAL | 1 refills | Status: DC

## 2023-11-18 MED ORDER — ROSUVASTATIN CALCIUM 20 MG PO TABS: 20.0000 mg | ORAL_TABLET | Freq: Every day | ORAL | 0 refills | Status: DC

## 2023-11-18 MED ORDER — METFORMIN HCL 500 MG PO TABS: 500.0000 mg | ORAL_TABLET | ORAL | 1 refills | Status: DC

## 2023-11-19 ENCOUNTER — Other Ambulatory Visit: Payer: Self-pay

## 2023-11-19 DIAGNOSIS — I10 Essential (primary) hypertension: Secondary | ICD-10-CM

## 2023-11-20 ENCOUNTER — Other Ambulatory Visit: Payer: Medicare HMO

## 2023-11-20 ENCOUNTER — Other Ambulatory Visit: Payer: Self-pay

## 2023-11-20 DIAGNOSIS — R0789 Other chest pain: Secondary | ICD-10-CM | POA: Diagnosis not present

## 2023-11-20 DIAGNOSIS — I10 Essential (primary) hypertension: Secondary | ICD-10-CM | POA: Diagnosis not present

## 2023-11-20 DIAGNOSIS — E782 Mixed hyperlipidemia: Secondary | ICD-10-CM | POA: Diagnosis not present

## 2023-11-20 DIAGNOSIS — E1165 Type 2 diabetes mellitus with hyperglycemia: Secondary | ICD-10-CM

## 2023-11-20 DIAGNOSIS — R0602 Shortness of breath: Secondary | ICD-10-CM | POA: Diagnosis not present

## 2023-11-20 DIAGNOSIS — I340 Nonrheumatic mitral (valve) insufficiency: Secondary | ICD-10-CM | POA: Diagnosis not present

## 2023-11-20 DIAGNOSIS — I259 Chronic ischemic heart disease, unspecified: Secondary | ICD-10-CM | POA: Diagnosis not present

## 2023-11-20 DIAGNOSIS — E785 Hyperlipidemia, unspecified: Secondary | ICD-10-CM

## 2023-11-20 MED ORDER — METFORMIN HCL 500 MG PO TABS: 500.0000 mg | ORAL_TABLET | ORAL | 1 refills | Status: DC

## 2023-11-20 MED ORDER — ROSUVASTATIN CALCIUM 20 MG PO TABS: 20.0000 mg | ORAL_TABLET | Freq: Every day | ORAL | 0 refills | Status: DC

## 2023-11-20 MED ORDER — VALSARTAN-HYDROCHLOROTHIAZIDE 80-12.5 MG PO TABS: 1.0000 | ORAL_TABLET | Freq: Every day | ORAL | 1 refills | Status: DC

## 2023-11-21 LAB — COMPREHENSIVE METABOLIC PANEL
ALT: 12 [IU]/L (ref 0–32)
AST: 21 [IU]/L (ref 0–40)
Albumin: 4 g/dL (ref 3.8–4.8)
Alkaline Phosphatase: 67 [IU]/L (ref 44–121)
BUN/Creatinine Ratio: 13 (ref 12–28)
BUN: 14 mg/dL (ref 8–27)
Bilirubin Total: <0.2 mg/dL (ref 0.0–1.2)
CO2: 25 mmol/L (ref 20–29)
Calcium: 9.2 mg/dL (ref 8.7–10.3)
Chloride: 102 mmol/L (ref 96–106)
Creatinine, Ser: 1.04 mg/dL — ABNORMAL HIGH (ref 0.57–1.00)
Globulin, Total: 2.6 g/dL (ref 1.5–4.5)
Glucose: 99 mg/dL (ref 70–99)
Potassium: 4.1 mmol/L (ref 3.5–5.2)
Sodium: 142 mmol/L (ref 134–144)
Total Protein: 6.6 g/dL (ref 6.0–8.5)
eGFR: 56 mL/min/{1.73_m2} — ABNORMAL LOW (ref >59)

## 2023-11-23 ENCOUNTER — Other Ambulatory Visit: Payer: Self-pay

## 2023-11-23 DIAGNOSIS — I10 Essential (primary) hypertension: Secondary | ICD-10-CM | POA: Diagnosis not present

## 2023-11-23 DIAGNOSIS — K219 Gastro-esophageal reflux disease without esophagitis: Secondary | ICD-10-CM | POA: Diagnosis not present

## 2023-11-23 DIAGNOSIS — J4550 Severe persistent asthma, uncomplicated: Secondary | ICD-10-CM | POA: Diagnosis not present

## 2023-11-23 DIAGNOSIS — Z809 Family history of malignant neoplasm, unspecified: Secondary | ICD-10-CM | POA: Diagnosis not present

## 2023-11-23 DIAGNOSIS — Z8249 Family history of ischemic heart disease and other diseases of the circulatory system: Secondary | ICD-10-CM | POA: Diagnosis not present

## 2023-11-23 DIAGNOSIS — M1990 Unspecified osteoarthritis, unspecified site: Secondary | ICD-10-CM | POA: Diagnosis not present

## 2023-11-23 DIAGNOSIS — E1136 Type 2 diabetes mellitus with diabetic cataract: Secondary | ICD-10-CM | POA: Diagnosis not present

## 2023-11-23 DIAGNOSIS — E669 Obesity, unspecified: Secondary | ICD-10-CM | POA: Diagnosis not present

## 2023-11-23 DIAGNOSIS — E785 Hyperlipidemia, unspecified: Secondary | ICD-10-CM | POA: Diagnosis not present

## 2023-11-23 DIAGNOSIS — E039 Hypothyroidism, unspecified: Secondary | ICD-10-CM | POA: Diagnosis not present

## 2023-11-23 MED ORDER — METOPROLOL SUCCINATE ER 50 MG PO TB24: 50.0000 mg | ORAL_TABLET | Freq: Every day | ORAL | 0 refills | Status: DC

## 2023-11-24 ENCOUNTER — Other Ambulatory Visit: Payer: Self-pay

## 2023-11-24 ENCOUNTER — Telehealth: Payer: Self-pay

## 2023-11-24 ENCOUNTER — Other Ambulatory Visit: Payer: Self-pay | Admitting: *Deleted

## 2023-11-24 ENCOUNTER — Other Ambulatory Visit (HOSPITAL_COMMUNITY): Payer: Self-pay

## 2023-11-24 ENCOUNTER — Ambulatory Visit: Payer: Medicare HMO

## 2023-11-24 DIAGNOSIS — R0789 Other chest pain: Secondary | ICD-10-CM

## 2023-11-24 DIAGNOSIS — I340 Nonrheumatic mitral (valve) insufficiency: Secondary | ICD-10-CM | POA: Diagnosis not present

## 2023-11-24 DIAGNOSIS — R072 Precordial pain: Secondary | ICD-10-CM | POA: Diagnosis not present

## 2023-11-24 DIAGNOSIS — E782 Mixed hyperlipidemia: Secondary | ICD-10-CM

## 2023-11-24 DIAGNOSIS — I10 Essential (primary) hypertension: Secondary | ICD-10-CM

## 2023-11-24 DIAGNOSIS — E1165 Type 2 diabetes mellitus with hyperglycemia: Secondary | ICD-10-CM

## 2023-11-24 DIAGNOSIS — I259 Chronic ischemic heart disease, unspecified: Secondary | ICD-10-CM

## 2023-11-24 DIAGNOSIS — R0602 Shortness of breath: Secondary | ICD-10-CM

## 2023-11-24 MED ORDER — TEZSPIRE 210 MG/1.91ML ~~LOC~~ SOAJ: 210.0000 mg | SUBCUTANEOUS | 11 refills | Status: DC

## 2023-11-24 MED ORDER — IOHEXOL 350 MG/ML SOLN
100.0000 mL | Freq: Once | INTRAVENOUS | Status: AC | PRN
Start: 2023-11-24 — End: 2023-11-24
  Administered 2023-11-24: 100 mL via INTRAVENOUS

## 2023-11-24 MED ORDER — METFORMIN HCL 500 MG PO TABS
500.0000 mg | ORAL_TABLET | Freq: Every day | ORAL | 1 refills | Status: AC
Start: 2023-11-24 — End: ?

## 2023-11-24 NOTE — Telephone Encounter (Signed)
 Test claim sent to [facility] for possible PAP

## 2023-11-26 ENCOUNTER — Ambulatory Visit: Payer: Medicare HMO | Admitting: Family

## 2023-11-26 ENCOUNTER — Encounter: Payer: Self-pay | Admitting: Family

## 2023-11-26 VITALS — BP 104/72 | HR 102 | Ht 63.0 in | Wt 178.2 lb

## 2023-11-26 DIAGNOSIS — E538 Deficiency of other specified B group vitamins: Secondary | ICD-10-CM

## 2023-11-26 DIAGNOSIS — E782 Mixed hyperlipidemia: Secondary | ICD-10-CM | POA: Diagnosis not present

## 2023-11-26 DIAGNOSIS — E1165 Type 2 diabetes mellitus with hyperglycemia: Secondary | ICD-10-CM | POA: Diagnosis not present

## 2023-11-26 DIAGNOSIS — E039 Hypothyroidism, unspecified: Secondary | ICD-10-CM

## 2023-11-26 DIAGNOSIS — H9192 Unspecified hearing loss, left ear: Secondary | ICD-10-CM

## 2023-11-26 DIAGNOSIS — E559 Vitamin D deficiency, unspecified: Secondary | ICD-10-CM | POA: Diagnosis not present

## 2023-11-26 DIAGNOSIS — Z1159 Encounter for screening for other viral diseases: Secondary | ICD-10-CM

## 2023-11-26 DIAGNOSIS — H9312 Tinnitus, left ear: Secondary | ICD-10-CM | POA: Diagnosis not present

## 2023-11-26 DIAGNOSIS — I10 Essential (primary) hypertension: Secondary | ICD-10-CM | POA: Diagnosis not present

## 2023-11-26 DIAGNOSIS — L602 Onychogryphosis: Secondary | ICD-10-CM

## 2023-11-26 LAB — POCT CBG (FASTING - GLUCOSE)-MANUAL ENTRY: Glucose Fasting, POC: 185 mg/dL — AB (ref 70–99)

## 2023-11-26 LAB — POC CREATINE & ALBUMIN,URINE
Albumin/Creatinine Ratio, Urine, POC: <30
Creatinine, POC: 100 mg/dL
Microalbumin Ur, POC: 30 mg/L

## 2023-12-02 NOTE — Addendum Note (Signed)
 Addended by: Devoria Glassing on: 12/02/2023 01:51 PM   Modules accepted: Orders

## 2023-12-07 ENCOUNTER — Ambulatory Visit: Payer: Medicare HMO | Admitting: Cardiovascular Disease

## 2023-12-07 ENCOUNTER — Encounter: Payer: Self-pay | Admitting: Cardiovascular Disease

## 2023-12-07 VITALS — BP 110/70 | HR 77 | Ht 63.0 in | Wt 172.0 lb

## 2023-12-07 DIAGNOSIS — R0789 Other chest pain: Secondary | ICD-10-CM

## 2023-12-07 DIAGNOSIS — I340 Nonrheumatic mitral (valve) insufficiency: Secondary | ICD-10-CM

## 2023-12-07 DIAGNOSIS — I10 Essential (primary) hypertension: Secondary | ICD-10-CM

## 2023-12-07 DIAGNOSIS — R42 Dizziness and giddiness: Secondary | ICD-10-CM | POA: Diagnosis not present

## 2023-12-07 DIAGNOSIS — E782 Mixed hyperlipidemia: Secondary | ICD-10-CM | POA: Diagnosis not present

## 2023-12-07 DIAGNOSIS — R0602 Shortness of breath: Secondary | ICD-10-CM

## 2023-12-07 MED ORDER — PANTOPRAZOLE SODIUM 40 MG PO TBEC: 40.0000 mg | DELAYED_RELEASE_TABLET | Freq: Every day | ORAL | 1 refills | Status: DC

## 2023-12-07 MED ORDER — SUCRALFATE 1 G PO TABS: 1.0000 g | ORAL_TABLET | Freq: Four times a day (QID) | ORAL | 1 refills | Status: DC

## 2023-12-07 NOTE — Progress Notes (Signed)
 Cardiology Office Note   Date:  12/07/2023   ID:  Anne Kerr, DOB 1951/08/25, MRN [id number]  PCP:  Miki Kins, FNP  Cardiologist:  Adrian Blackwater, MD      History of Present Illness: Anne Kerr is a 73 y.o. female who presents for  Chief Complaint  Patient presents with   Follow-up    4 Weeks CCTA Results    Sick in stomach, chest pain burning, lightheaded, tightness in neck  Chest Pain  This is a new problem. The current episode started in the past 7 days. The onset quality is sudden. The problem occurs 2 to 4 times per day. The problem has been rapidly worsening. The pain is at a severity of 4/10. The pain is moderate. The quality of the pain is described as burning. Associated symptoms include dizziness, malaise/fatigue and shortness of breath.      Past Medical History:  Diagnosis Date   Acute non-recurrent maxillary sinusitis 07/25/2015   Arthritis    Asthma    allergy induced   Complication of anesthesia    hard to wake up   Diabetes mellitus without complication (HCC)    GERD (gastroesophageal reflux disease)    High blood pressure    History of ear surgery 05/01/2021   History of kidney stones    h/o   Hyperlipidemia    Leaky heart valve    Leaky heart valve    Pain 07/09/2016   Pain in left knee 04/27/2019   Tachycardia    TIA (transient ischemic attack)    no residual side effects-pt states she didnt even know- a doctor told her this   Uterine cancer (HCC) 1989     Past Surgical History:  Procedure Laterality Date   COLONOSCOPY     KNEE ARTHROSCOPY Right    NASAL SINUS SURGERY     x 4   PARTIAL HYSTERECTOMY     SINOSCOPY     TOTAL KNEE ARTHROPLASTY Left 05/11/2019   Procedure: TOTAL KNEE ARTHROPLASTY;  Surgeon: Lyndle Herrlich, MD;  Location: [facility] ORS;  Service: Orthopedics;  Laterality: Left;     Current Outpatient Medications  Medication Sig Dispense Refill   pantoprazole (PROTONIX) 40 MG tablet Take 1 tablet (40 mg  total) by mouth daily. 30 tablet 1   sucralfate (CARAFATE) 1 g tablet Take 1 tablet (1 g total) by mouth 4 (four) times daily. 120 tablet 1   acetaminophen (TYLENOL) 500 MG tablet Take one to two if needed     AIRSUPRA 90-80 MCG/ACT AERO Inhale 2 puffs into the lungs every 4 (four) hours as needed. 10.7 g 1   albuterol (PROVENTIL) (2.5 MG/3ML) 0.083% nebulizer solution Take 3 mLs (2.5 mg total) by nebulization every 4 (four) hours as needed for wheezing or shortness of breath. 75 mL 1   albuterol (VENTOLIN HFA) 108 (90 Base) MCG/ACT inhaler Inhale 2 puffs into the lungs every 4 (four) hours as needed for wheezing or shortness of breath. 18 g 1   alendronate (FOSAMAX) 70 MG tablet Take by mouth.     amLODipine (NORVASC) 5 MG tablet Take by mouth.     azelastine (ASTELIN) 0.1 % nasal spray USE ONE SPRAY IN EACH NOSTRIL TWICE DAILY X 1 WEEK.     BD DISP NEEDLES 25G X 5/8" MISC USE AS DIRECTED TO ADMINISTER XOLAIR 3 each 11   Budeson-Glycopyrrol-Formoterol (BREZTRI AEROSPHERE) 160-9-4.8 MCG/ACT AERO Inhale 2 puffs into the lungs in the morning and at  bedtime. 10.7 g 5   Calcium Carbonate-Vitamin D (CALCIUM 600+D PO) Take 1 tablet by mouth daily.     cetirizine (ZYRTEC) 10 MG tablet TAKE 1 TABLET BY MOUTH EVERY DAY 90 tablet 1   Cholecalciferol (VITAMIN D-3) 125 MCG (5000 UT) TABS Take 5,000 Units by mouth daily.      Coenzyme Q10 100 MG capsule Take by mouth.     Cyanocobalamin (VITAMIN B-12 PO) Take 3,000 mcg by mouth daily.      EPINEPHrine (EPIPEN 2-PAK) 0.3 mg/0.3 mL IJ SOAJ injection Inject 0.3 mg into the muscle as needed for anaphylaxis. 1 each 2   fexofenadine (ALLEGRA) 180 MG tablet Take 1 tablet (180 mg total) by mouth daily. 90 tablet 1   ipratropium-albuterol (DUONEB) 0.5-2.5 (3) MG/3ML SOLN Take 3 mLs by nebulization every 6 (six) hours as needed. 100 mL 1   JARDIANCE 10 MG TABS tablet TAKE 1 TABLET BY MOUTH EVERY DAY 30 tablet 3   metFORMIN (GLUCOPHAGE) 500 MG tablet Take 1 tablet (500  mg total) by mouth daily. 90 tablet 1   metoprolol succinate (TOPROL-XL) 50 MG 24 hr tablet Take 1 tablet (50 mg total) by mouth daily. 90 tablet 0   rivaroxaban (XARELTO) 10 MG TABS tablet Xarelto 10 mg tablet  TAKE 1 TABLET (10 MG TOTAL) BY MOUTH DAILY WITH BREAKFAST.     rosuvastatin (CRESTOR) 20 MG tablet Take 1 tablet (20 mg total) by mouth daily. 90 tablet 0   triamcinolone (NASACORT) 55 MCG/ACT AERO nasal inhaler Place 2 sprays into the nose daily. 16.5 g 5   valsartan-hydrochlorothiazide (DIOVAN-HCT) 80-12.5 MG tablet Take 1 tablet by mouth daily. 90 tablet 1   Current Facility-Administered Medications  Medication Dose Route Frequency Provider Last Rate Last Admin   tezepelumab-ekko (TEZSPIRE) 210 MG/1. syringe 210 mg  210 mg Subcutaneous Q28 days Jessica Priest, MD   210 mg at 11/10/23 1439    Allergies:   Aspirin, Ibuprofen, Other, Shellfish allergy, Shellfish-derived products, Bee venom, Iodine, and Lisinopril    Social History:   reports that she has never smoked. She has never used smokeless tobacco. She reports that she does not drink alcohol and does not use drugs.   Family History:  family history is not on file.    ROS:     Review of Systems  Constitutional:  Positive for malaise/fatigue.  HENT: Negative.    Eyes: Negative.   Respiratory:  Positive for shortness of breath.   Cardiovascular:  Positive for chest pain.  Gastrointestinal: Negative.   Genitourinary: Negative.   Musculoskeletal: Negative.   Skin: Negative.   Neurological:  Positive for dizziness.  Endo/Heme/Allergies: Negative.   Psychiatric/Behavioral: Negative.    All other systems reviewed and are negative.     All other systems are reviewed and negative.    PHYSICAL EXAM: VS:  BP 110/70   Pulse 77   Ht 5\' 3"  (1.6 m)   Wt 172 lb (78 kg)   SpO2 96%   BMI 30.47 kg/m  , BMI Body mass index is 30.47 kg/m. Last weight:  Wt Readings from Last 3 Encounters:  12/07/23 172 lb (78 kg)   11/26/23 178 lb 3.2 oz (80.8 kg)  11/09/23 172 lb 9.6 oz (78.3 kg)     Physical Exam Constitutional:      Appearance: Normal appearance.  Cardiovascular:     Rate and Rhythm: Normal rate and regular rhythm.     Heart sounds: Normal heart sounds.  Pulmonary:  Effort: Pulmonary effort is normal.     Breath sounds: Normal breath sounds.  Musculoskeletal:     Right lower leg: No edema.     Left lower leg: No edema.  Neurological:     Mental Status: She is alert.       EKG:   Recent Labs: 05/25/2023: TSH 1.730 11/20/2023: ALT 12; BUN 14; Creatinine, Ser 1.04; Potassium 4.1; Sodium 142    Lipid Panel    Component Value Date/Time   CHOL 135 05/25/2023 0936   TRIG 86 05/25/2023 0936   HDL 72 05/25/2023 0936   CHOLHDL 1.9 05/25/2023 0936   LDLCALC 47 05/25/2023 0936      Other studies Reviewed: Additional studies/ records that were reviewed today include:  Review of the above records demonstrates:       No data to display            ASSESSMENT AND PLAN:    ICD-10-CM   1. Essential hypertension, benign  I10 pantoprazole (PROTONIX) 40 MG tablet    sucralfate (CARAFATE) 1 g tablet    2. Mixed hyperlipidemia  E78.2 pantoprazole (PROTONIX) 40 MG tablet    sucralfate (CARAFATE) 1 g tablet    3. Dizziness  R42 pantoprazole (PROTONIX) 40 MG tablet    sucralfate (CARAFATE) 1 g tablet    4. Other chest pain  R07.89 pantoprazole (PROTONIX) 40 MG tablet    sucralfate (CARAFATE) 1 g tablet   change omeprazole to protinx and add carafate as may be GERD, and f/u 3 days    5. SOB (shortness of breath)  R06.02 pantoprazole (PROTONIX) 40 MG tablet    sucralfate (CARAFATE) 1 g tablet    6. Nonrheumatic mitral valve regurgitation  I34.0 pantoprazole (PROTONIX) 40 MG tablet    sucralfate (CARAFATE) 1 g tablet       Problem List Items Addressed This Visit   None Visit Diagnoses       Essential hypertension, benign    -  Primary   Relevant Medications    pantoprazole (PROTONIX) 40 MG tablet   sucralfate (CARAFATE) 1 g tablet     Mixed hyperlipidemia       Relevant Medications   pantoprazole (PROTONIX) 40 MG tablet   sucralfate (CARAFATE) 1 g tablet     Dizziness       Relevant Medications   pantoprazole (PROTONIX) 40 MG tablet   sucralfate (CARAFATE) 1 g tablet     Other chest pain       change omeprazole to protinx and add carafate as may be GERD, and f/u 3 days   Relevant Medications   pantoprazole (PROTONIX) 40 MG tablet   sucralfate (CARAFATE) 1 g tablet     SOB (shortness of breath)       Relevant Medications   pantoprazole (PROTONIX) 40 MG tablet   sucralfate (CARAFATE) 1 g tablet     Nonrheumatic mitral valve regurgitation       Relevant Medications   pantoprazole (PROTONIX) 40 MG tablet   sucralfate (CARAFATE) 1 g tablet          Disposition:   Return in about 3 days (around 12/10/2023) for f/u 3 days.    Total time spent: 30 minutes  Signed,  Adrian Blackwater, MD  12/07/2023 11:35 AM    [facility]

## 2023-12-08 ENCOUNTER — Ambulatory Visit: Payer: Medicare HMO

## 2023-12-08 ENCOUNTER — Ambulatory Visit (INDEPENDENT_AMBULATORY_CARE_PROVIDER_SITE_OTHER)

## 2023-12-08 ENCOUNTER — Ambulatory Visit (INDEPENDENT_AMBULATORY_CARE_PROVIDER_SITE_OTHER): Payer: Medicare HMO | Admitting: Podiatry

## 2023-12-08 DIAGNOSIS — M79674 Pain in right toe(s): Secondary | ICD-10-CM | POA: Diagnosis not present

## 2023-12-08 DIAGNOSIS — M2011 Hallux valgus (acquired), right foot: Secondary | ICD-10-CM

## 2023-12-08 DIAGNOSIS — M79675 Pain in left toe(s): Secondary | ICD-10-CM

## 2023-12-08 DIAGNOSIS — B351 Tinea unguium: Secondary | ICD-10-CM | POA: Diagnosis not present

## 2023-12-08 MED ORDER — TERBINAFINE HCL 250 MG PO TABS: 250.0000 mg | ORAL_TABLET | Freq: Every day | ORAL | 0 refills | Status: DC

## 2023-12-08 NOTE — Progress Notes (Signed)
 Chief Complaint  Patient presents with   Foot Pain    "I have this bunion.  It's pushing over my toe and causing it to lift up.  It hurts." N - painful bunion L - right  D - 4 years O - gradually worse C - aching pain A - wear heels, closed toe shoes T - buy wider shoes, was using the toe separator that you all gave me   Nail Problem    "My toenails are sore." N - painful toenails L - hallux right and 2nd left D - 4 years O - gradually worse C - sore, Diabetic, big toenail fell off and grew back A - walking T -wear larger pair of shoes when it pushes up, I tried to cut them.    Subjective: 73 y.o. female presents today as a new patient for evaluation of pain and tenderness associated to a chronic bunion to the right foot.  Patient states that she was seen here in the office several years ago and x-rays were taken.  At that time bunion surgery was recommended however she did not pursue this option at the time.  He continues to progress of the get worse and the only alleviation she has is when she does not wear shoes or when she wears wide loosefitting shoes.  Patient also requesting nail debridement today.  She says that her nails are painful and tender and she is unable to trim her own toenails  Past Medical History:  Diagnosis Date   Acute non-recurrent maxillary sinusitis 07/25/2015   Arthritis    Asthma    allergy induced   Complication of anesthesia    hard to wake up   Diabetes mellitus without complication (HCC)    GERD (gastroesophageal reflux disease)    High blood pressure    History of ear surgery 05/01/2021   History of kidney stones    h/o   Hyperlipidemia    Leaky heart valve    Leaky heart valve    Pain 07/09/2016   Pain in left knee 04/27/2019   Tachycardia    TIA (transient ischemic attack)    no residual side effects-pt states she didnt even know- a doctor told her this   Uterine cancer (HCC) 1989    Past Surgical History:  Procedure Laterality  Date   COLONOSCOPY     KNEE ARTHROSCOPY Right    NASAL SINUS SURGERY     x 4   PARTIAL HYSTERECTOMY     SINOSCOPY     TOTAL KNEE ARTHROPLASTY Left 05/11/2019   Procedure: TOTAL KNEE ARTHROPLASTY;  Surgeon: Lyndle Herrlich, MD;  Location: [facility] ORS;  Service: Orthopedics;  Laterality: Left;    Allergies  Allergen Reactions   Aspirin Other (See Comments)    Wheeze  Wheeze  Wheeze  Wheeze  GI irritation  Wheeze, Wheeze, Wheeze   Ibuprofen Cough, Other (See Comments), Shortness Of Breath and Itching    Wheeze  Wheeze  Wheeze  Wheeze  Bronchial tubes swelled up  Wheeze, Wheeze, Wheeze   Other Other (See Comments) and Swelling    Bee Sting  Asthma Symptoms  Bee Sting  Bee Sting  Asthma Symptoms  Asthma Symptoms   Shellfish Allergy Other (See Comments)    Asthma Symptoms   Shellfish-Derived Products Other (See Comments)    Asthma Symptoms  Swelling of bronchial tubes   Bee Venom Swelling   Iodine Other (See Comments)    ASTHMA SYMPTOMS  ASTHMA SYMPTOMS  ASTHMA SYMPTOMS  ASTHMA SYMPTOMS, ASTHMA SYMPTOMS   Lisinopril      Objective: Physical Exam General: The patient is alert and oriented x3 in no acute distress.  Dermatology: Hyperkeratotic dystrophic nails noted 1-5 bilateral  Vascular: Palpable pedal pulses bilaterally. No edema or erythema noted. Capillary refill within normal limits.  Neurological: Light touch and protective threshold grossly intact bilaterally.   Musculoskeletal Exam: Clinical evidence of bunion deformity noted to the respective foot. There is moderate pain on palpation range of motion of the first MPJ. Lateral deviation of the hallux noted consistent with hallux abductovalgus.  Radiographic Exam RT foot 12/08/2023: Increased intermetatarsal angle noted between the first and second metatarsals with hallux abductus angle.  Consistent with hallux valgus deformity.  Hammertoe contracture deformity also noted to the lesser digits.  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  Hallux valgus right 2.  Hammertoe contracture second digit right 3.  Pain due to onychomycosis of toenails both 4.  Diabetes mellitus; uncomplicated, controlled   Plan of Care:  -Patient was evaluated. X-Rays reviewed. -Today we discussed conservative and surgical management of bunion deformity.  Currently the patient is having cardiac issues that she is dealing with and for now we will continue to pursue conservative management of bunion deformity -Recommend wide fitting shoes that do not irritate or constrict the toebox area or rub the bunion -Mechanical debridement of nails 1-5 bilateral's performed today noted with evidence of bleeding -Return to clinic as needed   Felecia Shelling, DPM [facility]  Dr. Felecia Shelling, DPM    [address] [place], [place] [address]                Office [phone]  Fax [phone]

## 2023-12-10 ENCOUNTER — Ambulatory Visit: Admitting: Cardiovascular Disease

## 2023-12-10 ENCOUNTER — Encounter: Payer: Self-pay | Admitting: Cardiovascular Disease

## 2023-12-10 VITALS — BP 104/60 | HR 74 | Ht 63.0 in | Wt 171.0 lb

## 2023-12-10 DIAGNOSIS — E1165 Type 2 diabetes mellitus with hyperglycemia: Secondary | ICD-10-CM

## 2023-12-10 DIAGNOSIS — I10 Essential (primary) hypertension: Secondary | ICD-10-CM

## 2023-12-10 DIAGNOSIS — I340 Nonrheumatic mitral (valve) insufficiency: Secondary | ICD-10-CM

## 2023-12-10 DIAGNOSIS — R0789 Other chest pain: Secondary | ICD-10-CM | POA: Diagnosis not present

## 2023-12-10 DIAGNOSIS — E782 Mixed hyperlipidemia: Secondary | ICD-10-CM | POA: Diagnosis not present

## 2023-12-10 NOTE — Progress Notes (Signed)
 Cardiology Office Note   Date:  12/10/2023   ID:  CONNI KNIGHTON, DOB 1950/12/05, MRN [id number]  PCP:  Miki Kins, FNP  Cardiologist:  Adrian Blackwater, MD      History of Present Illness: Anne Kerr is a 73 y.o. female who presents for  Chief Complaint  Patient presents with   Follow-up    3 days follow up    Patient came for follow-up after having episode Monday with chest pain described as burning sensation and belching.  She was prescribed Protonix 40 twice daily and Carafate 1 g 4 times daily.  She has not started that yet as it has not arrived at her house.  However been taking Maalox 3 times a day in addition to Protonix once a day.  Her chest pain has resolved.      Past Medical History:  Diagnosis Date   Acute non-recurrent maxillary sinusitis 07/25/2015   Arthritis    Asthma    allergy induced   Complication of anesthesia    hard to wake up   Diabetes mellitus without complication (HCC)    GERD (gastroesophageal reflux disease)    High blood pressure    History of ear surgery 05/01/2021   History of kidney stones    h/o   Hyperlipidemia    Leaky heart valve    Leaky heart valve    Pain 07/09/2016   Pain in left knee 04/27/2019   Tachycardia    TIA (transient ischemic attack)    no residual side effects-pt states she didnt even know- a doctor told her this   Uterine cancer (HCC) 1989     Past Surgical History:  Procedure Laterality Date   COLONOSCOPY     KNEE ARTHROSCOPY Right    NASAL SINUS SURGERY     x 4   PARTIAL HYSTERECTOMY     SINOSCOPY     TOTAL KNEE ARTHROPLASTY Left 05/11/2019   Procedure: TOTAL KNEE ARTHROPLASTY;  Surgeon: Lyndle Herrlich, MD;  Location: [facility] ORS;  Service: Orthopedics;  Laterality: Left;     Current Outpatient Medications  Medication Sig Dispense Refill   acetaminophen (TYLENOL) 500 MG tablet Take one to two if needed     AIRSUPRA 90-80 MCG/ACT AERO Inhale 2 puffs into the lungs every 4 (four) hours  as needed. 10.7 g 1   albuterol (PROVENTIL) (2.5 MG/3ML) 0.083% nebulizer solution Take 3 mLs (2.5 mg total) by nebulization every 4 (four) hours as needed for wheezing or shortness of breath. 75 mL 1   albuterol (VENTOLIN HFA) 108 (90 Base) MCG/ACT inhaler Inhale 2 puffs into the lungs every 4 (four) hours as needed for wheezing or shortness of breath. 18 g 1   alendronate (FOSAMAX) 70 MG tablet Take by mouth.     amLODipine (NORVASC) 5 MG tablet Take by mouth.     azelastine (ASTELIN) 0.1 % nasal spray USE ONE SPRAY IN EACH NOSTRIL TWICE DAILY X 1 WEEK.     BD DISP NEEDLES 25G X 5/8" MISC USE AS DIRECTED TO ADMINISTER XOLAIR 3 each 11   Budeson-Glycopyrrol-Formoterol (BREZTRI AEROSPHERE) 160-9-4.8 MCG/ACT AERO Inhale 2 puffs into the lungs in the morning and at bedtime. 10.7 g 5   Calcium Carbonate-Vitamin D (CALCIUM 600+D PO) Take 1 tablet by mouth daily.     cetirizine (ZYRTEC) 10 MG tablet TAKE 1 TABLET BY MOUTH EVERY DAY 90 tablet 1   Cholecalciferol (VITAMIN D-3) 125 MCG (5000 UT) TABS Take 5,000 Units  by mouth daily.      Coenzyme Q10 100 MG capsule Take by mouth.     Cyanocobalamin (VITAMIN B-12 PO) Take 3,000 mcg by mouth daily.      EPINEPHrine (EPIPEN 2-PAK) 0.3 mg/0.3 mL IJ SOAJ injection Inject 0.3 mg into the muscle as needed for anaphylaxis. 1 each 2   fexofenadine (ALLEGRA) 180 MG tablet Take 1 tablet (180 mg total) by mouth daily. 90 tablet 1   ipratropium-albuterol (DUONEB) 0.5-2.5 (3) MG/3ML SOLN Take 3 mLs by nebulization every 6 (six) hours as needed. 100 mL 1   JARDIANCE 10 MG TABS tablet TAKE 1 TABLET BY MOUTH EVERY DAY 30 tablet 3   metFORMIN (GLUCOPHAGE) 500 MG tablet Take 1 tablet (500 mg total) by mouth daily. 90 tablet 1   metoprolol succinate (TOPROL-XL) 50 MG 24 hr tablet Take 1 tablet (50 mg total) by mouth daily. 90 tablet 0   pantoprazole (PROTONIX) 40 MG tablet Take 1 tablet (40 mg total) by mouth daily. 30 tablet 1   rivaroxaban (XARELTO) 10 MG TABS tablet  Xarelto 10 mg tablet  TAKE 1 TABLET (10 MG TOTAL) BY MOUTH DAILY WITH BREAKFAST.     rosuvastatin (CRESTOR) 20 MG tablet Take 1 tablet (20 mg total) by mouth daily. 90 tablet 0   sucralfate (CARAFATE) 1 g tablet Take 1 tablet (1 g total) by mouth 4 (four) times daily. 120 tablet 1   terbinafine (LAMISIL) 250 MG tablet Take 1 tablet (250 mg total) by mouth daily. 90 tablet 0   triamcinolone (NASACORT) 55 MCG/ACT AERO nasal inhaler Place 2 sprays into the nose daily. 16.5 g 5   valsartan-hydrochlorothiazide (DIOVAN-HCT) 80-12.5 MG tablet Take 1 tablet by mouth daily. 90 tablet 1   Current Facility-Administered Medications  Medication Dose Route Frequency Provider Last Rate Last Admin   tezepelumab-ekko (TEZSPIRE) 210 MG/1. syringe 210 mg  210 mg Subcutaneous Q28 days Jessica Priest, MD   210 mg at 11/10/23 1439    Allergies:   Aspirin, Ibuprofen, Other, Shellfish allergy, Shellfish-derived products, Bee venom, Iodine, and Lisinopril    Social History:   reports that she has never smoked. She has never used smokeless tobacco. She reports that she does not drink alcohol and does not use drugs.   Family History:  family history is not on file.    ROS:     Review of Systems  Constitutional: Negative.   HENT: Negative.    Eyes: Negative.   Respiratory: Negative.    Gastrointestinal: Negative.   Genitourinary: Negative.   Musculoskeletal: Negative.   Skin: Negative.   Neurological: Negative.   Endo/Heme/Allergies: Negative.   Psychiatric/Behavioral: Negative.    All other systems reviewed and are negative.     All other systems are reviewed and negative.    PHYSICAL EXAM: VS:  BP 104/60   Pulse 74   Ht 5\' 3"  (1.6 m)   Wt 171 lb (77.6 kg)   SpO2 93%   BMI 30.29 kg/m  , BMI Body mass index is 30.29 kg/m. Last weight:  Wt Readings from Last 3 Encounters:  12/10/23 171 lb (77.6 kg)  12/07/23 172 lb (78 kg)  11/26/23 178 lb 3.2 oz (80.8 kg)     Physical Exam     EKG:   Recent Labs: 05/25/2023: TSH 1.730 11/20/2023: ALT 12; BUN 14; Creatinine, Ser 1.04; Potassium 4.1; Sodium 142    Lipid Panel    Component Value Date/Time   CHOL 135 05/25/2023 0936   TRIG  86 05/25/2023 0936   HDL 72 05/25/2023 0936   CHOLHDL 1.9 05/25/2023 0936   LDLCALC 47 05/25/2023 0936      Other studies Reviewed: Additional studies/ records that were reviewed today include:  Review of the above records demonstrates:       No data to display            ASSESSMENT AND PLAN:    ICD-10-CM   1. Mixed hyperlipidemia  E78.2     2. Essential hypertension, benign  I10     3. Type 2 diabetes mellitus with hyperglycemia, without long-term current use of insulin (HCC)  E11.65     4. Nonrheumatic mitral valve regurgitation  I34.0     5. Chest pain, non-cardiac  R07.89    Calcium score on CTA coronaries was 35 with no significant coronary artery disease.  Chest pain due to GERD, advise Protonix 40 twice daily/Carafate.       Problem List Items Addressed This Visit       Endocrine   Type 2 diabetes mellitus with hyperglycemia (HCC)   Other Visit Diagnoses       Mixed hyperlipidemia    -  Primary     Essential hypertension, benign         Nonrheumatic mitral valve regurgitation         Chest pain, non-cardiac       Calcium score on CTA coronaries was 35 with no significant coronary artery disease.  Chest pain due to GERD, advise Protonix 40 twice daily/Carafate.          Disposition:   Return in about 3 months (around 03/11/2024).    Total time spent: 30 minutes  Signed,  Adrian Blackwater, MD  12/10/2023 11:38 AM    [facility]

## 2023-12-14 ENCOUNTER — Ambulatory Visit: Admitting: *Deleted

## 2023-12-14 DIAGNOSIS — J4550 Severe persistent asthma, uncomplicated: Secondary | ICD-10-CM | POA: Diagnosis not present

## 2023-12-26 DIAGNOSIS — E559 Vitamin D deficiency, unspecified: Secondary | ICD-10-CM | POA: Insufficient documentation

## 2023-12-26 DIAGNOSIS — I10 Essential (primary) hypertension: Secondary | ICD-10-CM | POA: Insufficient documentation

## 2023-12-26 DIAGNOSIS — E782 Mixed hyperlipidemia: Secondary | ICD-10-CM | POA: Insufficient documentation

## 2023-12-26 NOTE — Assessment & Plan Note (Signed)
 Blood pressure well controlled with current medications.  Continue current therapy.  Will reassess at follow up.

## 2023-12-26 NOTE — Assessment & Plan Note (Addendum)
 Will continue supplements as needed. Recheck at follow up.

## 2023-12-26 NOTE — Assessment & Plan Note (Signed)
 Continue current therapy for lipid control. Will modify as needed based on labwork results.

## 2023-12-26 NOTE — Progress Notes (Signed)
 Established Patient Office Visit  Subjective:  Patient ID: Anne Kerr, female    DOB: [birth year]  Age: 73 y.o. MRN: [id number]  Chief Complaint  Patient presents with   Follow-up    3 month follow up    Patient is here today for her 3 months follow up.  She has been feeling fairly well since last appointment.   She does have additional concerns to discuss today.  She has been having some loss of hearing and some ringing in her ear, asks if we can get her set up with ENT.  Also needs a referral to podiatry, as her nails have become unmanageable at home.   Labs are due today. She needs refills.   I have reviewed her active problem list, medication list, allergies, health maintenance, notes from last encounter, lab results for her appointment today.      No other concerns at this time.   Past Medical History:  Diagnosis Date   Acute non-recurrent maxillary sinusitis 07/25/2015   Arthritis    Asthma    allergy induced   Complication of anesthesia    hard to wake up   Diabetes mellitus without complication (HCC)    GERD (gastroesophageal reflux disease)    High blood pressure    History of ear surgery 05/01/2021   History of kidney stones    h/o   Hyperlipidemia    Leaky heart valve    Leaky heart valve    Pain 07/09/2016   Pain in left knee 04/27/2019   Tachycardia    TIA (transient ischemic attack)    no residual side effects-pt states she didnt even know- a doctor told her this   Uterine cancer (HCC) 1989    Past Surgical History:  Procedure Laterality Date   COLONOSCOPY     KNEE ARTHROSCOPY Right    NASAL SINUS SURGERY     x 4   PARTIAL HYSTERECTOMY     SINOSCOPY     TOTAL KNEE ARTHROPLASTY Left 05/11/2019   Procedure: TOTAL KNEE ARTHROPLASTY;  Surgeon: Lyndle Herrlich, MD;  Location: [facility];  Service: Orthopedics;  Laterality: Left;    Social History   Socioeconomic History   Marital status: Married    Spouse name: Not on file   Number of  children: Not on file   Years of education: Not on file   Highest education level: Not on file  Occupational History   Not on file  Tobacco Use   Smoking status: Never   Smokeless tobacco: Never  Vaping Use   Vaping status: Never Used  Substance and Sexual Activity   Alcohol use: No    Alcohol/week: 0.0 standard drinks of alcohol   Drug use: No   Sexual activity: Not on file  Other Topics Concern   Not on file  Social History Narrative   Not on file   Social Drivers of Health   Financial Resource Strain: Not on file  Food Insecurity: Not on file  Transportation Needs: Not on file  Physical Activity: Low Risk  (06/16/2023)   Received from [facility]   PCARE Exercise SDOH    Exercise: Aerobic    PCare Exercise SDOH: Not on file    Weight Bearing Exercises: No  Stress: Not on file  Social Connections: Not on file  Intimate Partner Violence: Not on file    Family History  Problem Relation Age of Onset   Allergic rhinitis Neg Hx    Angioedema  Neg Hx    Asthma Neg Hx    Atopy Neg Hx    Eczema Neg Hx    Immunodeficiency Neg Hx    Urticaria Neg Hx     Allergies  Allergen Reactions   Aspirin Other (See Comments)    Wheeze  Wheeze  Wheeze  Wheeze  GI irritation  Wheeze, Wheeze, Wheeze   Ibuprofen Cough, Other (See Comments), Shortness Of Breath and Itching    Wheeze  Wheeze  Wheeze  Wheeze  Bronchial tubes swelled up  Wheeze, Wheeze, Wheeze   Other Other (See Comments) and Swelling    Bee Sting  Asthma Symptoms  Bee Sting  Bee Sting  Asthma Symptoms  Asthma Symptoms   Shellfish Allergy Other (See Comments)    Asthma Symptoms   Shellfish-Derived Products Other (See Comments)    Asthma Symptoms  Swelling of bronchial tubes   Bee Venom Swelling   Iodine Other (See Comments)    ASTHMA SYMPTOMS  ASTHMA SYMPTOMS  ASTHMA SYMPTOMS  ASTHMA SYMPTOMS, ASTHMA SYMPTOMS   Lisinopril     Review of Systems  HENT:  Positive for  hearing loss and tinnitus.   All other systems reviewed and are negative.      Objective:   BP 104/72   Pulse (!) 102   Ht 5\' 3"  (1.6 m)   Wt 178 lb 3.2 oz (80.8 kg)   SpO2 96%   BMI 31.57 kg/m   Vitals:   11/26/23 0954  BP: 104/72  Pulse: (!) 102  Height: 5\' 3"  (1.6 m)  Weight: 178 lb 3.2 oz (80.8 kg)  SpO2: 96%  BMI (Calculated): 31.57    Physical Exam Vitals and nursing note reviewed.  Constitutional:      Appearance: Normal appearance. She is normal weight.  HENT:     Head: Normocephalic and atraumatic.     Right Ear: Tympanic membrane, ear canal and external ear normal.     Left Ear: Tympanic membrane, ear canal and external ear normal.     Nose: Nose normal.  Eyes:     Extraocular Movements: Extraocular movements intact.     Conjunctiva/sclera: Conjunctivae normal.     Pupils: Pupils are equal, round, and reactive to light.  Cardiovascular:     Rate and Rhythm: Normal rate and regular rhythm.     Pulses: Normal pulses.     Heart sounds: Normal heart sounds.  Pulmonary:     Effort: Pulmonary effort is normal.     Breath sounds: Normal breath sounds.  Musculoskeletal:        General: Normal range of motion.     Cervical back: Normal range of motion.  Neurological:     General: No focal deficit present.     Mental Status: She is alert and oriented to person, place, and time. Mental status is at baseline.  Psychiatric:        Mood and Affect: Mood normal.        Behavior: Behavior normal.        Thought Content: Thought content normal.        Judgment: Judgment normal.      Results for orders placed or performed in visit on 11/26/23  POCT CBG (Fasting - Glucose)  Result Value Ref Range   Glucose Fasting, POC 185 (A) 70 - 99 mg/dL  POC CREATINE & ALBUMIN,URINE  Result Value Ref Range   Microalbumin Ur, POC 30 mg/L   Creatinine, POC 100 mg/dL   Albumin/Creatinine Ratio, Urine, POC <  30     Recent Results (from the past 2160 hours)  Comprehensive  metabolic panel     Status: Abnormal   Collection Time: 11/20/23  9:01 AM  Result Value Ref Range   Glucose 99 70 - 99 mg/dL   BUN 14 8 - 27 mg/dL   Creatinine, Ser 4.78 (H) 0.57 - 1.00 mg/dL   eGFR 56 (L) >29 FA/OZH/0.86   BUN/Creatinine Ratio 13 12 - 28   Sodium 142 134 - 144 mmol/L   Potassium 4.1 3.5 - 5.2 mmol/L   Chloride 102 96 - 106 mmol/L   CO2 25 20 - 29 mmol/L   Calcium 9.2 8.7 - 10.3 mg/dL   Total Protein 6.6 6.0 - 8.5 g/dL   Albumin 4.0 3.8 - 4.8 g/dL   Globulin, Total 2.6 1.5 - 4.5 g/dL   Bilirubin Total <5.7 0.0 - 1.2 mg/dL   Alkaline Phosphatase 67 44 - 121 IU/L   AST 21 0 - 40 IU/L   ALT 12 0 - 32 IU/L  POCT CBG (Fasting - Glucose)     Status: Abnormal   Collection Time: 11/26/23  9:58 AM  Result Value Ref Range   Glucose Fasting, POC 185 (A) 70 - 99 mg/dL  POC CREATINE & ALBUMIN,URINE     Status: None   Collection Time: 11/26/23 10:33 AM  Result Value Ref Range   Microalbumin Ur, POC 30 mg/L   Creatinine, POC 100 mg/dL   Albumin/Creatinine Ratio, Urine, POC <30        Assessment & Plan:   Problem List Items Addressed This Visit       Cardiovascular and Mediastinum   Essential hypertension, benign   Blood pressure well controlled with current medications.  Continue current therapy.  Will reassess at follow up.      Relevant Orders   CBC with Diff     Endocrine   Type 2 diabetes mellitus with hyperglycemia (HCC) - Primary   Checking labs today. Will call pt. With results  Continue current diabetes POC, as patient has been well controlled on current regimen.  Will adjust meds if needed based on labs.        Relevant Orders   POCT CBG (Fasting - Glucose) (Completed)   CBC with Diff   Hemoglobin A1c   POC CREATINE & ALBUMIN,URINE (Completed)     Other   Vitamin D deficiency, unspecified   Will continue supplements as needed. Recheck at follow up.      Relevant Orders   CBC with Diff   Vitamin D (25 hydroxy)   Mixed hyperlipidemia    Continue current therapy for lipid control. Will modify as needed based on labwork results.      Relevant Orders   CBC with Diff   Lipid Profile   Other Visit Diagnoses       B12 deficiency due to diet       Checking labs today.  Will continue supplements as needed.   Relevant Orders   CBC with Diff   Vitamin B12     Hypothyroidism (acquired)       Relevant Orders   CBC with Diff   TSH+T4F+T3Free     Need for hepatitis C screening test       Test ordered in office today. Will call with results.   Relevant Orders   Hepatitis C Ab reflex to Quant PCR     Hypertrophy of nail       Sending referral to podiatry.  Will defer to them for further treatment decisions   Relevant Orders   Ambulatory referral to Podiatry     Tinnitus of left ear       sending referral to ENT.  will defer to them for further treatment decisions   Relevant Orders   Ambulatory referral to ENT     Hearing loss of left ear, unspecified hearing loss type       sending referral to ENT.  will defer to them for further treatment decisions   Relevant Orders   Ambulatory referral to ENT       Return in about 4 months (around 03/25/2024) for F/U.   Total time spent: 20 minutes  Miki Kins, FNP  11/26/2023   This document may have been prepared by [facility] Voice Recognition software and as such may include unintentional dictation errors.

## 2023-12-26 NOTE — Assessment & Plan Note (Signed)
 Checking labs today. Will call pt. With results  Continue current diabetes POC, as patient has been well controlled on current regimen.  Will adjust meds if needed based on labs.

## 2023-12-28 ENCOUNTER — Telehealth: Payer: Self-pay

## 2023-12-28 ENCOUNTER — Other Ambulatory Visit (HOSPITAL_COMMUNITY): Payer: Self-pay

## 2023-12-28 MED ORDER — TEZSPIRE 210 MG/1.91ML ~~LOC~~ SOAJ: 210.0000 mg | SUBCUTANEOUS | 11 refills | Status: DC

## 2023-12-28 NOTE — Addendum Note (Signed)
 Addended by: Devoria Glassing on: 12/28/2023 11:40 AM   Modules accepted: Orders

## 2023-12-29 ENCOUNTER — Other Ambulatory Visit: Payer: Self-pay

## 2023-12-29 MED ORDER — EMPAGLIFLOZIN 10 MG PO TABS: 10.0000 mg | ORAL_TABLET | Freq: Every day | ORAL | 3 refills | Status: DC

## 2024-01-01 NOTE — Addendum Note (Signed)
 Addended by: Devoria Glassing on: 01/01/2024 09:15 AM   Modules accepted: Orders

## 2024-01-11 ENCOUNTER — Ambulatory Visit

## 2024-01-11 DIAGNOSIS — J4550 Severe persistent asthma, uncomplicated: Secondary | ICD-10-CM | POA: Diagnosis not present

## 2024-01-12 ENCOUNTER — Other Ambulatory Visit: Payer: Self-pay | Admitting: Allergy and Immunology

## 2024-01-15 ENCOUNTER — Other Ambulatory Visit: Payer: Self-pay | Admitting: Family

## 2024-01-26 ENCOUNTER — Other Ambulatory Visit: Payer: Self-pay

## 2024-01-28 MED ORDER — TERBINAFINE HCL 250 MG PO TABS: 250.0000 mg | ORAL_TABLET | Freq: Every day | ORAL | 0 refills | Status: DC

## 2024-01-30 ENCOUNTER — Other Ambulatory Visit: Payer: Self-pay | Admitting: Cardiovascular Disease

## 2024-01-30 DIAGNOSIS — R0789 Other chest pain: Secondary | ICD-10-CM

## 2024-01-30 DIAGNOSIS — R42 Dizziness and giddiness: Secondary | ICD-10-CM

## 2024-01-30 DIAGNOSIS — I340 Nonrheumatic mitral (valve) insufficiency: Secondary | ICD-10-CM

## 2024-01-30 DIAGNOSIS — R0602 Shortness of breath: Secondary | ICD-10-CM

## 2024-01-30 DIAGNOSIS — I10 Essential (primary) hypertension: Secondary | ICD-10-CM

## 2024-01-30 DIAGNOSIS — E782 Mixed hyperlipidemia: Secondary | ICD-10-CM

## 2024-02-09 ENCOUNTER — Telehealth: Payer: Self-pay | Admitting: *Deleted

## 2024-02-09 ENCOUNTER — Ambulatory Visit (INDEPENDENT_AMBULATORY_CARE_PROVIDER_SITE_OTHER): Payer: Federal, State, Local not specified - PPO | Admitting: Allergy and Immunology

## 2024-02-09 ENCOUNTER — Other Ambulatory Visit: Payer: Self-pay | Admitting: *Deleted

## 2024-02-09 ENCOUNTER — Other Ambulatory Visit: Payer: Self-pay | Admitting: Allergy and Immunology

## 2024-02-09 ENCOUNTER — Ambulatory Visit

## 2024-02-09 VITALS — BP 110/64 | HR 80 | Temp 98.3°F | Resp 14 | Ht 62.75 in | Wt 175.1 lb

## 2024-02-09 DIAGNOSIS — J4550 Severe persistent asthma, uncomplicated: Secondary | ICD-10-CM | POA: Diagnosis not present

## 2024-02-09 DIAGNOSIS — I10 Essential (primary) hypertension: Secondary | ICD-10-CM

## 2024-02-09 DIAGNOSIS — K219 Gastro-esophageal reflux disease without esophagitis: Secondary | ICD-10-CM

## 2024-02-09 DIAGNOSIS — T7800XA Anaphylactic reaction due to unspecified food, initial encounter: Secondary | ICD-10-CM

## 2024-02-09 DIAGNOSIS — T7800XD Anaphylactic reaction due to unspecified food, subsequent encounter: Secondary | ICD-10-CM

## 2024-02-09 DIAGNOSIS — R0789 Other chest pain: Secondary | ICD-10-CM

## 2024-02-09 DIAGNOSIS — J3089 Other allergic rhinitis: Secondary | ICD-10-CM | POA: Diagnosis not present

## 2024-02-09 DIAGNOSIS — R0602 Shortness of breath: Secondary | ICD-10-CM

## 2024-02-09 DIAGNOSIS — E782 Mixed hyperlipidemia: Secondary | ICD-10-CM

## 2024-02-09 DIAGNOSIS — R42 Dizziness and giddiness: Secondary | ICD-10-CM

## 2024-02-09 DIAGNOSIS — I340 Nonrheumatic mitral (valve) insufficiency: Secondary | ICD-10-CM

## 2024-02-09 MED ORDER — IPRATROPIUM-ALBUTEROL 0.5-2.5 (3) MG/3ML IN SOLN: 3.0000 mL | Freq: Four times a day (QID) | RESPIRATORY_TRACT | 1 refills | Status: AC | PRN

## 2024-02-09 MED ORDER — EPINEPHRINE 0.3 MG/0.3ML IJ SOAJ: 0.3000 mg | INTRAMUSCULAR | 1 refills | Status: AC | PRN

## 2024-02-09 MED ORDER — TEZSPIRE 210 MG/1.91ML ~~LOC~~ SOAJ: 210.0000 mg | SUBCUTANEOUS | 3 refills | Status: DC

## 2024-02-09 MED ORDER — AIRSUPRA 90-80 MCG/ACT IN AERO: 2.0000 | INHALATION_SPRAY | RESPIRATORY_TRACT | 1 refills | Status: DC | PRN

## 2024-02-09 MED ORDER — NEBULIZER MASK ADULT MISC: 1.0000 | 1 refills | Status: DC

## 2024-02-09 MED ORDER — BREZTRI AEROSPHERE 160-9-4.8 MCG/ACT IN AERO: 2.0000 | INHALATION_SPRAY | Freq: Two times a day (BID) | RESPIRATORY_TRACT | 1 refills | Status: AC

## 2024-02-09 MED ORDER — TRIAMCINOLONE ACETONIDE 55 MCG/ACT NA AERO: 2.0000 | INHALATION_SPRAY | Freq: Every day | NASAL | 1 refills | Status: AC

## 2024-02-09 MED ORDER — PANTOPRAZOLE SODIUM 40 MG PO TBEC: 40.0000 mg | DELAYED_RELEASE_TABLET | Freq: Every morning | ORAL | 1 refills | Status: AC

## 2024-02-09 NOTE — Telephone Encounter (Signed)
-----   Message from Nurse Barrett Borders sent at 01/11/2024 11:08 AM EDT ----- Patient has new insurance. Anne Kerr is scanning it in now.

## 2024-02-09 NOTE — Progress Notes (Unsigned)
 [place] - [place] - [name] - [place]   Follow-up Note  Referring Provider: Trenda Frisk, FNP Primary Provider: Trenda Frisk, FNP Date of Office Visit: 02/09/2024  Subjective:   Anne Kerr (DOB: [birth year]) is a 73 y.o. female who [facility] in re-evaluation of the following:  HPI: Anne Kerr returns to this clinic in evaluation of asthma, allergic rhinitis, chronic sinusitis, chronic mastoiditis, reflux, and food allergy directed against shellfish.  I last saw her in this clinic 18 August 2023.  She has really done well with her airway since I have last seen her in this clinic.  She can exert herself with no problem and rarely uses the short acting bronchodilator and has not required a systemic steroid or antibiotic for any type of airway issue while she continues on anti-TSL P antibody.  She also continues to use low-dose nasal steroid and an inhaled steroid in the form of Nasacort  and Breztri .  She is only using these agents 1 time per day.  Her reflux apparently flared and gave rise to some chest pain and back pain and she saw her cardiologist and it sounds as though she was placed on Protonix  to replace her omeprazole  and was taking some Maalox.  Fortunately, that issue appears to resolved.  She does not consume any shellfish.  Allergies as of 02/09/2024       Reactions   Aspirin Other (See Comments)   Wheeze Wheeze  Wheeze  Wheeze GI irritation Wheeze, Wheeze, Wheeze   Ibuprofen Cough, Other (See Comments), Shortness Of Breath, Itching   Wheeze Wheeze  Wheeze  Wheeze Bronchial tubes swelled up Wheeze, Wheeze, Wheeze   Other Other (See Comments), Swelling   Bee Sting Asthma Symptoms Bee Sting  Bee Sting  Asthma Symptoms  Asthma Symptoms   Shellfish Allergy Other (See Comments)   Asthma Symptoms   Shellfish-derived Products Other (See Comments)   Asthma Symptoms Swelling of bronchial  tubes   Bee Venom Swelling   Iodine  Other (See Comments)   ASTHMA SYMPTOMS ASTHMA SYMPTOMS  ASTHMA SYMPTOMS ASTHMA SYMPTOMS, ASTHMA SYMPTOMS   Lisinopril         Medication List    Airsupra  90-80 MCG/ACT Aero Generic drug: Albuterol -Budesonide  Inhale 2 puffs into the lungs every 4 (four) hours as needed.   albuterol  (2.5 MG/3ML) 0.083% nebulizer solution Commonly known as: PROVENTIL  Take 3 mLs (2.5 mg total) by nebulization every 4 (four) hours as needed for wheezing or shortness of breath.   alendronate 70 MG tablet Commonly known as: FOSAMAX Take by mouth.   amLODipine 5 MG tablet Commonly known as: NORVASC Take by mouth.   BD Disp Needles 25G X 5/8" Misc Generic drug: NEEDLE (DISP) 25 G USE AS DIRECTED TO ADMINISTER XOLAIR    Breztri  Aerosphere 160-9-4.8 MCG/ACT Aero inhaler Generic drug: budeson-glycopyrrolate -formoterol  Inhale 2 puffs into the lungs in the morning and at bedtime.   CALCIUM  600+D PO Take 1 tablet by mouth daily.   cetirizine  10 MG tablet Commonly known as: ZYRTEC  TAKE 1 TABLET BY MOUTH EVERY DAY   Coenzyme Q10 100 MG capsule Take by mouth.   EPINEPHrine  0.3 mg/0.3 mL Soaj injection Commonly known as: EpiPen  2-Pak Inject 0.3 mg into the muscle as needed for anaphylaxis.   fexofenadine  180 MG tablet Commonly known as: ALLEGRA  Take 1 tablet (180 mg total) by mouth daily.   ipratropium-albuterol  0.5-2.5 (3) MG/3ML Soln Commonly known as: DUONEB Take 3 mLs by  nebulization every 6 (six) hours as needed.   Jardiance  10 MG Tabs tablet Generic drug: empagliflozin  TAKE 1 TABLET BY MOUTH EVERY DAY   metFORMIN  500 MG tablet Commonly known as: GLUCOPHAGE  Take 1 tablet (500 mg total) by mouth daily.   metoprolol  succinate 50 MG 24 hr tablet Commonly known as: TOPROL -XL Take 1 tablet (50 mg total) by mouth daily.   pantoprazole  40 MG tablet Commonly known as: PROTONIX  TAKE 1 TABLET EVERY DAY   rivaroxaban  10 MG Tabs tablet Commonly  known as: XARELTO  Xarelto  10 mg tablet  TAKE 1 TABLET (10 MG TOTAL) BY MOUTH DAILY WITH BREAKFAST.   rosuvastatin  20 MG tablet Commonly known as: CRESTOR  Take 1 tablet (20 mg total) by mouth daily.   sucralfate  1 g tablet Commonly known as: CARAFATE  TAKE 1 TABLET FOUR TIMES DAILY   terbinafine  250 MG tablet Commonly known as: LAMISIL  Take 1 tablet (250 mg total) by mouth daily.   Tezspire  210 MG/1. Soaj Generic drug: Tezepelumab -ekko Inject 210 mg into the skin every 28 (twenty-eight) days. Started by: CMA Tammy V   triamcinolone  55 MCG/ACT Aero nasal inhaler Commonly known as: NASACORT  Place 2 sprays into the nose daily.   valsartan -hydrochlorothiazide  80-12.5 MG tablet Commonly known as: DIOVAN -HCT Take 1 tablet by mouth daily.   VITAMIN B-12 PO Take 3,000 mcg by mouth daily.   Vitamin D -3 125 MCG (5000 UT) Tabs Take 5,000 Units by mouth daily.    Past Medical History:  Diagnosis Date   Acute non-recurrent maxillary sinusitis 07/25/2015   Arthritis    Asthma    allergy induced   Complication of anesthesia    hard to wake up   Diabetes mellitus without complication (HCC)    GERD (gastroesophageal reflux disease)    High blood pressure    History of ear surgery 05/01/2021   History of kidney stones    h/o   Hyperlipidemia    Leaky heart valve    Leaky heart valve    Pain 07/09/2016   Pain in left knee 04/27/2019   Tachycardia    TIA (transient ischemic attack)    no residual side effects-pt states she didnt even know- a doctor told her this   Uterine cancer (HCC) 1989    Past Surgical History:  Procedure Laterality Date   COLONOSCOPY     KNEE ARTHROSCOPY Right    NASAL SINUS SURGERY     x 4   PARTIAL HYSTERECTOMY     SINOSCOPY     TOTAL KNEE ARTHROPLASTY Left 05/11/2019   Procedure: TOTAL KNEE ARTHROPLASTY;  Surgeon: Jerlyn Moons, MD;  Location: [facility];  Service: Orthopedics;  Laterality: Left;    Review of systems negative except as  noted in HPI / PMHx or noted below:  Review of Systems  Constitutional: Negative.   HENT: Negative.    Eyes: Negative.   Respiratory: Negative.    Cardiovascular: Negative.   Gastrointestinal: Negative.   Genitourinary: Negative.   Musculoskeletal: Negative.   Skin: Negative.   Neurological: Negative.   Endo/Heme/Allergies: Negative.   Psychiatric/Behavioral: Negative.       Objective:   Vitals:   02/09/24 1047  BP: 110/64  Pulse: 80  Resp: 14  Temp: 98.3 F (36.8 C)  SpO2: 96%   Height: 5' 2.75" (159.4 cm)  Weight: 175 lb 1.6 oz (79.4 kg)   Physical Exam Constitutional:      Appearance: She is not diaphoretic.  HENT:     Head: Normocephalic.  Right Ear: Ear canal and external ear normal. Tympanic membrane is scarred.     Left Ear: Ear canal and external ear normal. Tympanic membrane is scarred.     Nose: Nose normal. No mucosal edema or rhinorrhea.     Mouth/Throat:     Pharynx: Uvula midline. No oropharyngeal exudate.  Eyes:     Conjunctiva/sclera: Conjunctivae normal.  Neck:     Thyroid : No thyromegaly.     Trachea: Trachea normal. No tracheal tenderness or tracheal deviation.  Cardiovascular:     Rate and Rhythm: Normal rate and regular rhythm.     Heart sounds: Normal heart sounds, S1 normal and S2 normal. No murmur heard. Pulmonary:     Effort: No respiratory distress.     Breath sounds: Normal breath sounds. No stridor. No wheezing or rales.  Lymphadenopathy:     Head:     Right side of head: No tonsillar adenopathy.     Left side of head: No tonsillar adenopathy.     Cervical: No cervical adenopathy.  Skin:    Findings: No erythema or rash.     Nails: There is no clubbing.  Neurological:     Mental Status: She is alert.     Diagnostics: Spirometry was performed and demonstrated an FEV1 of 1.48 at 88 % of predicted.  Assessment and Plan:   1. Asthma, severe persistent, well-controlled   2. Other allergic rhinitis   3. Gastroesophageal  reflux disease, unspecified whether esophagitis present   4. Allergy with anaphylaxis due to food    1. Continue Tezepelumab  injections every 4 weeks   2. Continue Breztri  - 2 inhalations 1-2 times per day with spacer   3. Continue Nasacort  one spray each nostril 1-2 times per day  4. Continue pantoprazole  40 mg 1 time per day  5. If needed:   A.  Nasal saline multiple times per day  B.  AirSupra  -2 inhalations every 4-6 hours   C.  Duoneb - nebulize every 4-6 hours D.  Epi-Pen  6. Influenza = Tamiflu. Covid = Paxlovid  7. Return to clinic in 6 months or earlier if needed  Anne Kerr appears to be doing very well while utilizing a collection of anti-inflammatory agents for her airway which includes the use of anti-TSL P antibody.  She will maintain this anti-inflammatory therapy and she can vary the dose of Breztri  and Nasacort  depending on how she is doing at any given point in time.  Currently she is only using these medications 1 time per day.  And she will remain on therapy for reflux currently using Protonix  once a day.  She will remain away from consumption of shellfish.  I will see her back in this clinic in 6 months or earlier if there is a problem.            Schuyler Custard, MD Allergy / Immunology [facility]

## 2024-02-09 NOTE — Telephone Encounter (Signed)
 Called patient and advised rx to [facility] by mail but unsure if they carry same. Different answer every time I call to check to see if they carry medication. Patient to let me know if she hears from them. In meantime we have still been trying to push her PAP through [employer] with no luck

## 2024-02-09 NOTE — Patient Instructions (Addendum)
  1. Continue Tezepelumab  injections every 4 weeks   2. Continue Breztri  - 2 inhalations 1-2 times per day with spacer   3. Continue Nasacort  one spray each nostril 1-2 times per day  4. Continue pantoprazole  40 mg 1 time per day  5. If needed:   A.  Nasal saline multiple times per day  B.  AirSupra  -2 inhalations every 4-6 hours   C.  Duoneb - nebulize every 4-6 hours D.  Epi-Pen  6. Influenza = Tamiflu. Covid = Paxlovid  7. Return to clinic in 6 months or earlier if needed

## 2024-02-23 ENCOUNTER — Other Ambulatory Visit: Payer: Self-pay | Admitting: Family

## 2024-02-23 DIAGNOSIS — I10 Essential (primary) hypertension: Secondary | ICD-10-CM

## 2024-02-26 NOTE — Telephone Encounter (Signed)
 Called patient/no answer. Left message for call back to office. [facility] not [facility].

## 2024-03-08 ENCOUNTER — Ambulatory Visit

## 2024-03-08 DIAGNOSIS — J4550 Severe persistent asthma, uncomplicated: Secondary | ICD-10-CM | POA: Diagnosis not present

## 2024-03-11 ENCOUNTER — Ambulatory Visit: Admitting: Cardiovascular Disease

## 2024-03-11 ENCOUNTER — Other Ambulatory Visit: Payer: Self-pay | Admitting: Cardiovascular Disease

## 2024-03-14 ENCOUNTER — Encounter: Payer: Self-pay | Admitting: Cardiovascular Disease

## 2024-03-14 ENCOUNTER — Ambulatory Visit (INDEPENDENT_AMBULATORY_CARE_PROVIDER_SITE_OTHER): Admitting: Cardiovascular Disease

## 2024-03-14 VITALS — BP 118/62 | HR 64 | Ht 62.0 in | Wt 173.8 lb

## 2024-03-14 DIAGNOSIS — R0789 Other chest pain: Secondary | ICD-10-CM

## 2024-03-14 DIAGNOSIS — E782 Mixed hyperlipidemia: Secondary | ICD-10-CM

## 2024-03-14 DIAGNOSIS — I10 Essential (primary) hypertension: Secondary | ICD-10-CM

## 2024-03-14 DIAGNOSIS — E1165 Type 2 diabetes mellitus with hyperglycemia: Secondary | ICD-10-CM | POA: Diagnosis not present

## 2024-03-14 NOTE — Progress Notes (Signed)
 Cardiology Office Note   Date:  03/14/2024   ID:  [name], DOB 11-22-1950, MRN [id number]  PCP:  Trenda Frisk, FNP  Cardiologist:  Debborah Fairly, MD      History of Present Illness: Anne Kerr is a 73 y.o. female who presents for  Chief Complaint  Patient presents with   Follow-up    3 month follow up    Doing well, no chest pain      Past Medical History:  Diagnosis Date   Acute non-recurrent maxillary sinusitis 07/25/2015   Arthritis    Asthma    allergy induced   Complication of anesthesia    hard to wake up   Diabetes mellitus without complication (HCC)    GERD (gastroesophageal reflux disease)    High blood pressure    History of ear surgery 05/01/2021   History of kidney stones    h/o   Hyperlipidemia    Leaky heart valve    Leaky heart valve    Pain 07/09/2016   Pain in left knee 04/27/2019   Tachycardia    TIA (transient ischemic attack)    no residual side effects-pt states she didnt even know- a doctor told her this   Uterine cancer (HCC) 1989     Past Surgical History:  Procedure Laterality Date   COLONOSCOPY     KNEE ARTHROSCOPY Right    NASAL SINUS SURGERY     x 4   PARTIAL HYSTERECTOMY     SINOSCOPY     TOTAL KNEE ARTHROPLASTY Left 05/11/2019   Procedure: TOTAL KNEE ARTHROPLASTY;  Surgeon: Jerlyn Moons, MD;  Location: [facility] ORS;  Service: Orthopedics;  Laterality: Left;     Current Outpatient Medications  Medication Sig Dispense Refill   AIRSUPRA  90-80 MCG/ACT AERO Inhale 2 puffs into the lungs every 4 (four) hours as needed. 10.7 g 1   albuterol  (PROVENTIL ) (2.5 MG/3ML) 0.083% nebulizer solution Take 3 mLs (2.5 mg total) by nebulization every 4 (four) hours as needed for wheezing or shortness of breath. 75 mL 1   alendronate (FOSAMAX) 70 MG tablet Take by mouth.     amLODipine (NORVASC) 5 MG tablet Take by mouth.     BD DISP NEEDLES 25G X 5/8 MISC USE AS DIRECTED TO ADMINISTER XOLAIR  3 each 11    budeson-glycopyrrolate -formoterol  (BREZTRI  AEROSPHERE) 160-9-4.8 MCG/ACT AERO inhaler Inhale 2 puffs into the lungs in the morning and at bedtime. 32.1 g 1   Calcium  Carbonate-Vitamin D  (CALCIUM  600+D PO) Take 1 tablet by mouth daily.     cetirizine  (ZYRTEC ) 10 MG tablet TAKE 1 TABLET BY MOUTH EVERY DAY 90 tablet 1   Cholecalciferol  (VITAMIN D -3) 125 MCG (5000 UT) TABS Take 5,000 Units by mouth daily.      Coenzyme Q10 100 MG capsule Take by mouth.     Cyanocobalamin  (VITAMIN B-12 PO) Take 3,000 mcg by mouth daily.      EPINEPHrine  (EPIPEN  2-PAK) 0.3 mg/0.3 mL IJ SOAJ injection Inject 0.3 mg into the muscle as needed for anaphylaxis. 2 each 1   fexofenadine  (ALLEGRA ) 180 MG tablet Take 1 tablet (180 mg total) by mouth daily. 90 tablet 1   ipratropium-albuterol  (DUONEB) 0.5-2.5 (3) MG/3ML SOLN Take 3 mLs by nebulization every 6 (six) hours as needed. 100 mL 1   JARDIANCE  10 MG TABS tablet TAKE 1 TABLET EVERY DAY 90 tablet 3   metFORMIN  (GLUCOPHAGE ) 500 MG tablet Take 1 tablet (500 mg total) by mouth daily. 90  tablet 1   metoprolol  succinate (TOPROL -XL) 50 MG 24 hr tablet TAKE 1 TABLET EVERY DAY 90 tablet 3   Nebulizers (COMP AIR COMPRESSOR NEBULIZER) MISC 1 DEVICE BY DOES NOT APPLY ROUTE AS DIRECTED. 1 each 1   pantoprazole  (PROTONIX ) 40 MG tablet Take 1 tablet (40 mg total) by mouth every morning. 90 tablet 1   rivaroxaban  (XARELTO ) 10 MG TABS tablet Xarelto  10 mg tablet  TAKE 1 TABLET (10 MG TOTAL) BY MOUTH DAILY WITH BREAKFAST.     rosuvastatin  (CRESTOR ) 20 MG tablet Take 1 tablet (20 mg total) by mouth daily. 90 tablet 0   sucralfate  (CARAFATE ) 1 g tablet TAKE 1 TABLET FOUR TIMES DAILY 240 tablet 5   terbinafine  (LAMISIL ) 250 MG tablet Take 1 tablet (250 mg total) by mouth daily. 90 tablet 0   Tezepelumab -ekko (TEZSPIRE ) 210 MG/1. SOAJ Inject 210 mg into the skin every 28 (twenty-eight) days. 5.73 mL 3   triamcinolone  (NASACORT ) 55 MCG/ACT AERO nasal inhaler Place 2 sprays into the nose  daily. 48 g 1   valsartan -hydrochlorothiazide  (DIOVAN -HCT) 80-12.5 MG tablet Take 1 tablet by mouth daily. 90 tablet 1   Current Facility-Administered Medications  Medication Dose Route Frequency Provider Last Rate Last Admin   tezepelumab -ekko (TEZSPIRE ) 210 MG/1. syringe 210 mg  210 mg Subcutaneous Q28 days Kozlow, Eric J, MD   210 mg at 03/08/24 1055    Allergies:   Aspirin, Ibuprofen, Other, Shellfish allergy, Shellfish-derived products, Bee venom, Iodine , and Lisinopril    Social History:   reports that she has never smoked. She has never used smokeless tobacco. She reports that she does not drink alcohol and does not use drugs.   Family History:  family history is not on file.    ROS:     Review of Systems  Constitutional: Negative.   HENT: Negative.    Eyes: Negative.   Respiratory: Negative.    Gastrointestinal: Negative.   Genitourinary: Negative.   Musculoskeletal: Negative.   Skin: Negative.   Neurological: Negative.   Endo/Heme/Allergies: Negative.   Psychiatric/Behavioral: Negative.    All other systems reviewed and are negative.     All other systems are reviewed and negative.    PHYSICAL EXAM: VS:  BP 118/62   Pulse 64   Ht 5' 2 (1.575 m)   Wt 173 lb 12.8 oz (78.8 kg)   SpO2 95%   BMI 31.79 kg/m  , BMI Body mass index is 31.79 kg/m. Last weight:  Wt Readings from Last 3 Encounters:  03/14/24 173 lb 12.8 oz (78.8 kg)  02/09/24 175 lb 1.6 oz (79.4 kg)  12/10/23 171 lb (77.6 kg)     Physical Exam Constitutional:      Appearance: Normal appearance.   Cardiovascular:     Rate and Rhythm: Normal rate and regular rhythm.     Heart sounds: Normal heart sounds.  Pulmonary:     Effort: Pulmonary effort is normal.     Breath sounds: Normal breath sounds.   Musculoskeletal:     Right lower leg: No edema.     Left lower leg: No edema.   Neurological:     Mental Status: She is alert.       EKG:   Recent Labs: 05/25/2023: TSH  1.730 11/20/2023: ALT 12; BUN 14; Creatinine, Ser 1.04; Potassium 4.1; Sodium 142    Lipid Panel    Component Value Date/Time   CHOL 135 05/25/2023 0936   TRIG 86 05/25/2023 0936   HDL 72 05/25/2023  0936   CHOLHDL 1.9 05/25/2023 0936   LDLCALC 47 05/25/2023 0936      Other studies Reviewed: Additional studies/ records that were reviewed today include:  Review of the above records demonstrates:       No data to display            ASSESSMENT AND PLAN:    ICD-10-CM   1. Other chest pain  R07.89    No longer has chest pain    2. Mixed hyperlipidemia  E78.2     3. Type 2 diabetes mellitus with hyperglycemia, without long-term current use of insulin (HCC)  E11.65     4. Essential hypertension, benign  I10        Problem List Items Addressed This Visit       Cardiovascular and Mediastinum   Essential hypertension, benign     Endocrine   Type 2 diabetes mellitus with hyperglycemia (HCC)     Other   Mixed hyperlipidemia   Other Visit Diagnoses       Other chest pain    -  Primary   No longer has chest pain          Disposition:   Return in about 3 months (around 06/14/2024).    Total time spent: 30 minutes  Signed,  Debborah Fairly, MD  03/14/2024 10:39 AM    [facility]

## 2024-03-25 ENCOUNTER — Encounter: Payer: Self-pay | Admitting: Family

## 2024-03-25 ENCOUNTER — Ambulatory Visit: Payer: Medicare HMO | Admitting: Family

## 2024-03-25 VITALS — BP 104/78 | HR 49 | Ht 62.0 in | Wt 173.6 lb

## 2024-03-25 DIAGNOSIS — E559 Vitamin D deficiency, unspecified: Secondary | ICD-10-CM | POA: Diagnosis not present

## 2024-03-25 DIAGNOSIS — Z794 Long term (current) use of insulin: Secondary | ICD-10-CM

## 2024-03-25 DIAGNOSIS — I10 Essential (primary) hypertension: Secondary | ICD-10-CM | POA: Diagnosis not present

## 2024-03-25 DIAGNOSIS — E1165 Type 2 diabetes mellitus with hyperglycemia: Secondary | ICD-10-CM | POA: Diagnosis not present

## 2024-03-25 DIAGNOSIS — E782 Mixed hyperlipidemia: Secondary | ICD-10-CM

## 2024-03-25 DIAGNOSIS — R001 Bradycardia, unspecified: Secondary | ICD-10-CM

## 2024-03-25 NOTE — Progress Notes (Signed)
 Established Patient Office Visit  Subjective:  Patient ID: Anne Kerr, female    DOB: [birth year]  Age: 73 y.o. MRN: [id number]  Chief Complaint  Patient presents with   Follow-up    4 month follow up    Patient is here today for her 4 months follow up.  She has been feeling poorly since last appointment.   She does have additional concerns to discuss today.  She has been having palpitations.  Also been having some dizziness, is concerned about her heart.  Her heart rate is quite low today.   Labs are not due today. She needs refills.   I have reviewed her active problem list, medication list, allergies, health maintenance, notes from last encounter, lab results for her appointment today.      No other concerns at this time.   Past Medical History:  Diagnosis Date   Acute non-recurrent maxillary sinusitis 07/25/2015   Arthritis    Asthma    allergy induced   Complication of anesthesia    hard to wake up   Diabetes mellitus without complication (HCC)    GERD (gastroesophageal reflux disease)    High blood pressure    History of ear surgery 05/01/2021   History of kidney stones    h/o   Hyperlipidemia    Leaky heart valve    Leaky heart valve    Pain 07/09/2016   Pain in left knee 04/27/2019   Tachycardia    TIA (transient ischemic attack)    no residual side effects-pt states she didnt even know- a doctor told her this   Uterine cancer (HCC) 1989    Past Surgical History:  Procedure Laterality Date   COLONOSCOPY     KNEE ARTHROSCOPY Right    NASAL SINUS SURGERY     x 4   PARTIAL HYSTERECTOMY     SINOSCOPY     TOTAL KNEE ARTHROPLASTY Left 05/11/2019   Procedure: TOTAL KNEE ARTHROPLASTY;  Surgeon: Leora Lynwood JONELLE, MD;  Location: [facility] OR[facility];  Service: Orthopedics;  Laterality: Left;    Social History   Socioeconomic History   Marital status: Married    Spouse name: Not on file   Number of children: Not on file   Years of education: Not on file    Highest education level: Not on file  Occupational History   Not on file  Tobacco Use   Smoking status: Never   Smokeless tobacco: Never  Vaping Use   Vaping status: Never Used  Substance and Sexual Activity   Alcohol use: No    Alcohol/week: 0.0 standard drinks of alcohol   Drug use: No   Sexual activity: Not on file  Other Topics Concern   Not on file  Social History Narrative   Not on file   Social Drivers of Health   Financial Resource Strain: Not on file  Food Insecurity: Not on file  Transportation Needs: Not on file  Physical Activity: Low Risk  (06/16/2023)   Received from [facility]   PCARE Exercise SDOH    Exercise: Aerobic    PCare Exercise SDOH: Not on file    Weight Bearing Exercises: No  Stress: Not on file  Social Connections: Not on file  Intimate Partner Violence: Not on file    Family History  Problem Relation Age of Onset   Allergic rhinitis Neg Hx    Angioedema Neg Hx    Asthma Neg Hx    Atopy Neg Hx  Eczema Neg Hx    Immunodeficiency Neg Hx    Urticaria Neg Hx     Allergies  Allergen Reactions   Aspirin Other (See Comments)    Wheeze  Wheeze  Wheeze  Wheeze  GI irritation  Wheeze, Wheeze, Wheeze   Ibuprofen Cough, Other (See Comments), Shortness Of Breath and Itching    Wheeze  Wheeze  Wheeze  Wheeze  Bronchial tubes swelled up  Wheeze, Wheeze, Wheeze   Other Other (See Comments) and Swelling    Bee Sting  Asthma Symptoms  Bee Sting  Bee Sting  Asthma Symptoms  Asthma Symptoms   Shellfish Allergy Other (See Comments)    Asthma Symptoms   Shellfish-Derived Products Other (See Comments)    Asthma Symptoms  Swelling of bronchial tubes   Bee Venom Swelling   Iodine  Other (See Comments)    ASTHMA SYMPTOMS  ASTHMA SYMPTOMS  ASTHMA SYMPTOMS  ASTHMA SYMPTOMS, ASTHMA SYMPTOMS   Lisinopril     Review of Systems  Cardiovascular:  Positive for palpitations.  Neurological:  Positive for  dizziness.  All other systems reviewed and are negative.      Objective:   BP 104/78   Pulse (!) 49   Ht 5' 2 (1.575 m)   Wt 173 lb 9.6 oz (78.7 kg)   SpO2 99%   BMI 31.75 kg/m   Vitals:   03/25/24 0904  BP: 104/78  Pulse: (!) 49  Height: 5' 2 (1.575 m)  Weight: 173 lb 9.6 oz (78.7 kg)  SpO2: 99%  BMI (Calculated): 31.74    Physical Exam Vitals and nursing note reviewed.  Constitutional:      General: She is not in acute distress.    Appearance: Normal appearance. She is normal weight. She is ill-appearing. She is not toxic-appearing.  HENT:     Head: Normocephalic.  Eyes:     Extraocular Movements: Extraocular movements intact.     Conjunctiva/sclera: Conjunctivae normal.     Pupils: Pupils are equal, round, and reactive to light.  Cardiovascular:     Rate and Rhythm: Normal rate.  Pulmonary:     Effort: Pulmonary effort is normal.  Neurological:     General: No focal deficit present.     Mental Status: She is alert and oriented to person, place, and time. Mental status is at baseline.  Psychiatric:        Mood and Affect: Mood normal.        Behavior: Behavior normal.        Thought Content: Thought content normal.        Judgment: Judgment normal.      Results for orders placed or performed in visit on 03/25/24  HM DIABETES EYE EXAM  Result Value Ref Range   HM Diabetic Eye Exam No Retinopathy No Retinopathy    No results found for this or any previous visit (from the past 2160 hours).     Assessment & Plan Type 2 diabetes mellitus with hyperglycemia, with long-term current use of insulin (HCC) Continue current diabetes POC, as patient has been well controlled on current regimen.  Will adjust meds if needed based on labs.   Bradycardia EKG In office today shows no changes.  I have asked pt to half her metoprolol  for right now, until she sees Dr. Fernand again.   Essential hypertension, benign Blood pressure well controlled with current  medications.  Continue current therapy.  Will reassess at follow up.   Mixed hyperlipidemia Continue current therapy for  lipid control. Will modify as needed based on labwork results.   Vitamin D  deficiency, unspecified Will continue supplements as needed.    Given that patient does not currently feel well, will defer labs to follow up.     Return in about 4 months (around 07/25/2024) for F/U.   Total time spent: 30 minutes  ALAN CHRISTELLA ARRANT, FNP  03/25/2024   This document may have been prepared by [facility] Voice Recognition software and as such may include unintentional dictation errors.

## 2024-03-31 ENCOUNTER — Ambulatory Visit: Admitting: Cardiovascular Disease

## 2024-03-31 ENCOUNTER — Encounter: Payer: Self-pay | Admitting: Cardiovascular Disease

## 2024-03-31 VITALS — BP 110/68 | HR 68 | Ht 63.0 in | Wt 171.2 lb

## 2024-03-31 DIAGNOSIS — R001 Bradycardia, unspecified: Secondary | ICD-10-CM

## 2024-03-31 DIAGNOSIS — E782 Mixed hyperlipidemia: Secondary | ICD-10-CM

## 2024-03-31 DIAGNOSIS — R0789 Other chest pain: Secondary | ICD-10-CM

## 2024-03-31 DIAGNOSIS — E1165 Type 2 diabetes mellitus with hyperglycemia: Secondary | ICD-10-CM

## 2024-03-31 DIAGNOSIS — Z794 Long term (current) use of insulin: Secondary | ICD-10-CM

## 2024-03-31 DIAGNOSIS — I10 Essential (primary) hypertension: Secondary | ICD-10-CM

## 2024-03-31 LAB — POCT CBG (FASTING - GLUCOSE)-MANUAL ENTRY: Glucose Fasting, POC: 100 mg/dL — AB (ref 70–99)

## 2024-03-31 MED ORDER — VALSARTAN 40 MG PO TABS: 40.0000 mg | ORAL_TABLET | Freq: Every day | ORAL | 11 refills | Status: DC

## 2024-03-31 MED ORDER — METOPROLOL SUCCINATE ER 25 MG PO TB24: 12.5000 mg | ORAL_TABLET | Freq: Every day | ORAL | 11 refills | Status: DC

## 2024-03-31 MED ORDER — RIVAROXABAN 10 MG PO TABS: 10.0000 mg | ORAL_TABLET | Freq: Every day | ORAL | 2 refills | Status: DC

## 2024-03-31 NOTE — Progress Notes (Signed)
 Cardiology Office Note   Date:  03/31/2024   ID:  Anne Kerr, DOB 01-03-1951, MRN [id number]  PCP:  Orlean Alan HERO, FNP  Cardiologist:  Denyse Bathe, MD      History of Present Illness: Anne Kerr is a 73 y.o. female who presents for  Chief Complaint  Patient presents with   Acute Visit    Low heart rate/headaches    Has chest pain, and feels dizzy, as heart rate is flutuating. Has headaches and blurred vision.      Past Medical History:  Diagnosis Date   Acute non-recurrent maxillary sinusitis 07/25/2015   Arthritis    Asthma    allergy induced   Complication of anesthesia    hard to wake up   Diabetes mellitus without complication (HCC)    GERD (gastroesophageal reflux disease)    High blood pressure    History of ear surgery 05/01/2021   History of kidney stones    h/o   Hyperlipidemia    Leaky heart valve    Leaky heart valve    Pain 07/09/2016   Pain in left knee 04/27/2019   Tachycardia    TIA (transient ischemic attack)    no residual side effects-pt states she didnt even know- a doctor told her this   Uterine cancer (HCC) 1989     Past Surgical History:  Procedure Laterality Date   COLONOSCOPY     KNEE ARTHROSCOPY Right    NASAL SINUS SURGERY     x 4   PARTIAL HYSTERECTOMY     SINOSCOPY     TOTAL KNEE ARTHROPLASTY Left 05/11/2019   Procedure: TOTAL KNEE ARTHROPLASTY;  Surgeon: Leora Lynwood JONELLE, MD;  Location: [facility] OR[facility];  Service: Orthopedics;  Laterality: Left;     Current Outpatient Medications  Medication Sig Dispense Refill   budeson-glycopyrrolate -formoterol  (BREZTRI  AEROSPHERE) 160-9-4.8 MCG/ACT AERO inhaler Inhale 2 puffs into the lungs in the morning and at bedtime. 32.1 g 1   cetirizine  (ZYRTEC ) 10 MG tablet TAKE 1 TABLET BY MOUTH EVERY DAY 90 tablet 1   Cholecalciferol  (VITAMIN D -3) 125 MCG (5000 UT) TABS Take 5,000 Units by mouth daily.      Cyanocobalamin  (VITAMIN B-12 PO) Take 3,000 mcg by mouth daily.       EPINEPHrine  (EPIPEN  2-PAK) 0.3 mg/0.3 mL IJ SOAJ injection Inject 0.3 mg into the muscle as needed for anaphylaxis. 2 each 1   ipratropium-albuterol  (DUONEB) 0.5-2.5 (3) MG/3ML SOLN Take 3 mLs by nebulization every 6 (six) hours as needed. 100 mL 1   JARDIANCE  10 MG TABS tablet TAKE 1 TABLET EVERY DAY 90 tablet 3   metFORMIN  (GLUCOPHAGE ) 500 MG tablet Take 1 tablet (500 mg total) by mouth daily. 90 tablet 1   metoprolol  succinate (TOPROL  XL) 25 MG 24 hr tablet Take 0.5 tablets (12.5 mg total) by mouth daily. 15 tablet 11   Nebulizers (COMP AIR COMPRESSOR NEBULIZER) MISC 1 DEVICE BY DOES NOT APPLY ROUTE AS DIRECTED. 1 each 1   pantoprazole  (PROTONIX ) 40 MG tablet Take 1 tablet (40 mg total) by mouth every morning. 90 tablet 1   rosuvastatin  (CRESTOR ) 20 MG tablet Take 1 tablet (20 mg total) by mouth daily. 90 tablet 0   sucralfate  (CARAFATE ) 1 g tablet TAKE 1 TABLET FOUR TIMES DAILY 240 tablet 5   Tezepelumab -ekko (TEZSPIRE ) 210 MG/1. SOAJ Inject 210 mg into the skin every 28 (twenty-eight) days. 5.73 mL 3   triamcinolone  (NASACORT ) 55 MCG/ACT AERO nasal inhaler Place 2  sprays into the nose daily. 48 g 1   AIRSUPRA  90-80 MCG/ACT AERO Inhale 2 puffs into the lungs every 4 (four) hours as needed. 10.7 g 1   albuterol  (PROVENTIL ) (2.5 MG/3ML) 0.083% nebulizer solution Take 3 mLs (2.5 mg total) by nebulization every 4 (four) hours as needed for wheezing or shortness of breath. 75 mL 1   Coenzyme Q10 100 MG capsule Take by mouth.     rivaroxaban  (XARELTO ) 10 MG TABS tablet Take 1 tablet (10 mg total) by mouth daily. 30 tablet 2   valsartan  (DIOVAN ) 40 MG tablet Take 1 tablet (40 mg total) by mouth daily. 30 tablet 11   No current facility-administered medications for this visit.    Allergies:   Aspirin, Ibuprofen, Other, Shellfish allergy, Shellfish-derived products, Bee venom, Iodine , and Lisinopril    Social History:   reports that she has never smoked. She has never used smokeless tobacco.  She reports that she does not drink alcohol and does not use drugs.   Family History:  family history is not on file.    ROS:     Review of Systems  Constitutional: Negative.   HENT: Negative.    Eyes: Negative.   Respiratory: Negative.    Gastrointestinal: Negative.   Genitourinary: Negative.   Musculoskeletal: Negative.   Skin: Negative.   Neurological: Negative.   Endo/Heme/Allergies: Negative.   Psychiatric/Behavioral: Negative.    All other systems reviewed and are negative.     All other systems are reviewed and negative.    PHYSICAL EXAM: VS:  BP 110/68   Pulse 68   Ht 5' 3 (1.6 m)   Wt 171 lb 3.2 oz (77.7 kg)   SpO2 97%   BMI 30.33 kg/m  , BMI Body mass index is 30.33 kg/m. Last weight:  Wt Readings from Last 3 Encounters:  03/31/24 171 lb 3.2 oz (77.7 kg)  03/25/24 173 lb 9.6 oz (78.7 kg)  03/14/24 173 lb 12.8 oz (78.8 kg)     Physical Exam Constitutional:      Appearance: Normal appearance.  Cardiovascular:     Rate and Rhythm: Normal rate and regular rhythm.     Heart sounds: Normal heart sounds.  Pulmonary:     Effort: Pulmonary effort is normal.     Breath sounds: Normal breath sounds.  Musculoskeletal:     Right lower leg: No edema.     Left lower leg: No edema.  Neurological:     Mental Status: She is alert.       EKG:   Recent Labs: 05/25/2023: TSH 1.730 11/20/2023: ALT 12; BUN 14; Creatinine, Ser 1.04; Potassium 4.1; Sodium 142    Lipid Panel    Component Value Date/Time   CHOL 135 05/25/2023 0936   TRIG 86 05/25/2023 0936   HDL 72 05/25/2023 0936   CHOLHDL 1.9 05/25/2023 0936   LDLCALC 47 05/25/2023 0936      Other studies Reviewed: Additional studies/ records that were reviewed today include:  Review of the above records demonstrates:       No data to display            ASSESSMENT AND PLAN:    ICD-10-CM   1. Type 2 diabetes mellitus with hyperglycemia, with long-term current use of insulin (HCC)  E11.65  POCT CBG (Fasting - Glucose)   Z79.4 rivaroxaban  (XARELTO ) 10 MG TABS tablet    metoprolol  succinate (TOPROL  XL) 25 MG 24 hr tablet    valsartan  (DIOVAN ) 40 MG tablet  2. Mixed hyperlipidemia  E78.2 rivaroxaban  (XARELTO ) 10 MG TABS tablet    metoprolol  succinate (TOPROL  XL) 25 MG 24 hr tablet    valsartan  (DIOVAN ) 40 MG tablet    3. Bradycardia  R00.1 rivaroxaban  (XARELTO ) 10 MG TABS tablet    metoprolol  succinate (TOPROL  XL) 25 MG 24 hr tablet    valsartan  (DIOVAN ) 40 MG tablet   Patient is feeling lightheaded and dizzy and may have bradycardia causing it.  Change metoprolol  to 12.5 once a day.    4. Essential hypertension, benign  I10 rivaroxaban  (XARELTO ) 10 MG TABS tablet    metoprolol  succinate (TOPROL  XL) 25 MG 24 hr tablet    valsartan  (DIOVAN ) 40 MG tablet   Blood pressure appears to be low and may be because of her lightheadedness and blurring of vision.  Will change Diovan  to 40 mg once a day.    5. Chest pain, non-cardiac  R07.89 rivaroxaban  (XARELTO ) 10 MG TABS tablet    metoprolol  succinate (TOPROL  XL) 25 MG 24 hr tablet    valsartan  (DIOVAN ) 40 MG tablet       Problem List Items Addressed This Visit       Cardiovascular and Mediastinum   Essential hypertension, benign   Relevant Medications   rivaroxaban  (XARELTO ) 10 MG TABS tablet   metoprolol  succinate (TOPROL  XL) 25 MG 24 hr tablet   valsartan  (DIOVAN ) 40 MG tablet     Endocrine   Type 2 diabetes mellitus with hyperglycemia (HCC) - Primary   Relevant Medications   rivaroxaban  (XARELTO ) 10 MG TABS tablet   metoprolol  succinate (TOPROL  XL) 25 MG 24 hr tablet   valsartan  (DIOVAN ) 40 MG tablet   Other Relevant Orders   POCT CBG (Fasting - Glucose) (Completed)     Other   Mixed hyperlipidemia   Relevant Medications   rivaroxaban  (XARELTO ) 10 MG TABS tablet   metoprolol  succinate (TOPROL  XL) 25 MG 24 hr tablet   valsartan  (DIOVAN ) 40 MG tablet   Other Visit Diagnoses       Bradycardia       Patient  is feeling lightheaded and dizzy and may have bradycardia causing it.  Change metoprolol  to 12.5 once a day.   Relevant Medications   rivaroxaban  (XARELTO ) 10 MG TABS tablet   metoprolol  succinate (TOPROL  XL) 25 MG 24 hr tablet   valsartan  (DIOVAN ) 40 MG tablet     Chest pain, non-cardiac       Relevant Medications   rivaroxaban  (XARELTO ) 10 MG TABS tablet   metoprolol  succinate (TOPROL  XL) 25 MG 24 hr tablet   valsartan  (DIOVAN ) 40 MG tablet          Disposition:   Return in about 1 week (around 04/07/2024).    Total time spent: 35 minutes  Signed,  Denyse Bathe, MD  03/31/2024 11:56 AM    [facility]

## 2024-04-03 ENCOUNTER — Encounter: Payer: Self-pay | Admitting: Family

## 2024-04-03 NOTE — Assessment & Plan Note (Signed)
 Blood pressure well controlled with current medications.  Continue current therapy.  Will reassess at follow up.

## 2024-04-03 NOTE — Assessment & Plan Note (Signed)
 Will continue supplements as needed.

## 2024-04-03 NOTE — Assessment & Plan Note (Signed)
 Continue current therapy for lipid control. Will modify as needed based on labwork results.

## 2024-04-03 NOTE — Assessment & Plan Note (Signed)
 Continue current diabetes POC, as patient has been well controlled on current regimen.  Will adjust meds if needed based on labs.

## 2024-04-07 ENCOUNTER — Other Ambulatory Visit: Payer: Self-pay

## 2024-04-07 DIAGNOSIS — E1165 Type 2 diabetes mellitus with hyperglycemia: Secondary | ICD-10-CM

## 2024-04-07 DIAGNOSIS — I10 Essential (primary) hypertension: Secondary | ICD-10-CM

## 2024-04-07 DIAGNOSIS — R001 Bradycardia, unspecified: Secondary | ICD-10-CM

## 2024-04-07 DIAGNOSIS — R0789 Other chest pain: Secondary | ICD-10-CM

## 2024-04-07 DIAGNOSIS — E782 Mixed hyperlipidemia: Secondary | ICD-10-CM

## 2024-04-11 ENCOUNTER — Encounter: Payer: Self-pay | Admitting: Cardiovascular Disease

## 2024-04-11 ENCOUNTER — Ambulatory Visit (INDEPENDENT_AMBULATORY_CARE_PROVIDER_SITE_OTHER): Admitting: Cardiovascular Disease

## 2024-04-11 ENCOUNTER — Other Ambulatory Visit: Payer: Self-pay

## 2024-04-11 VITALS — BP 115/70 | HR 65 | Ht 63.0 in | Wt 174.2 lb

## 2024-04-11 DIAGNOSIS — I10 Essential (primary) hypertension: Secondary | ICD-10-CM

## 2024-04-11 DIAGNOSIS — E782 Mixed hyperlipidemia: Secondary | ICD-10-CM | POA: Diagnosis not present

## 2024-04-11 DIAGNOSIS — E1165 Type 2 diabetes mellitus with hyperglycemia: Secondary | ICD-10-CM

## 2024-04-11 DIAGNOSIS — R0789 Other chest pain: Secondary | ICD-10-CM | POA: Diagnosis not present

## 2024-04-11 DIAGNOSIS — R001 Bradycardia, unspecified: Secondary | ICD-10-CM

## 2024-04-11 NOTE — Progress Notes (Signed)
 Cardiology Office Note   Date:  04/11/2024   ID:  Anne Kerr, DOB 1951-02-07, MRN [id number]  PCP:  Orlean Alan HERO, FNP  Cardiologist:  Denyse Bathe, MD      History of Present Illness: Anne Kerr is a 73 y.o. female who presents for  Chief Complaint  Patient presents with   Follow-up    1 week    Feeling much better.      Past Medical History:  Diagnosis Date   Acute non-recurrent maxillary sinusitis 07/25/2015   Arthritis    Asthma    allergy induced   Complication of anesthesia    hard to wake up   Diabetes mellitus without complication (HCC)    GERD (gastroesophageal reflux disease)    High blood pressure    History of ear surgery 05/01/2021   History of kidney stones    h/o   Hyperlipidemia    Leaky heart valve    Leaky heart valve    Pain 07/09/2016   Pain in left knee 04/27/2019   Tachycardia    TIA (transient ischemic attack)    no residual side effects-pt states she didnt even know- a doctor told her this   Uterine cancer (HCC) 1989     Past Surgical History:  Procedure Laterality Date   COLONOSCOPY     KNEE ARTHROSCOPY Right    NASAL SINUS SURGERY     x 4   PARTIAL HYSTERECTOMY     SINOSCOPY     TOTAL KNEE ARTHROPLASTY Left 05/11/2019   Procedure: TOTAL KNEE ARTHROPLASTY;  Surgeon: Leora Lynwood JONELLE, MD;  Location: [facility] ORS;  Service: Orthopedics;  Laterality: Left;     Current Outpatient Medications  Medication Sig Dispense Refill   AIRSUPRA  90-80 MCG/ACT AERO Inhale 2 puffs into the lungs every 4 (four) hours as needed. 10.7 g 1   albuterol  (PROVENTIL ) (2.5 MG/3ML) 0.083% nebulizer solution Take 3 mLs (2.5 mg total) by nebulization every 4 (four) hours as needed for wheezing or shortness of breath. 75 mL 1   budeson-glycopyrrolate -formoterol  (BREZTRI  AEROSPHERE) 160-9-4.8 MCG/ACT AERO inhaler Inhale 2 puffs into the lungs in the morning and at bedtime. 32.1 g 1   cetirizine  (ZYRTEC ) 10 MG tablet TAKE 1 TABLET BY MOUTH  EVERY DAY 90 tablet 1   Cholecalciferol  (VITAMIN D -3) 125 MCG (5000 UT) TABS Take 5,000 Units by mouth daily.      Coenzyme Q10 100 MG capsule Take by mouth.     Cyanocobalamin  (VITAMIN B-12 PO) Take 3,000 mcg by mouth daily.      EPINEPHrine  (EPIPEN  2-PAK) 0.3 mg/0.3 mL IJ SOAJ injection Inject 0.3 mg into the muscle as needed for anaphylaxis. 2 each 1   ipratropium-albuterol  (DUONEB) 0.5-2.5 (3) MG/3ML SOLN Take 3 mLs by nebulization every 6 (six) hours as needed. 100 mL 1   JARDIANCE  10 MG TABS tablet TAKE 1 TABLET EVERY DAY 90 tablet 3   metFORMIN  (GLUCOPHAGE ) 500 MG tablet Take 1 tablet (500 mg total) by mouth daily. 90 tablet 1   metoprolol  succinate (TOPROL  XL) 25 MG 24 hr tablet Take 0.5 tablets (12.5 mg total) by mouth daily. 15 tablet 11   Nebulizers (COMP AIR COMPRESSOR NEBULIZER) MISC 1 DEVICE BY DOES NOT APPLY ROUTE AS DIRECTED. 1 each 1   pantoprazole  (PROTONIX ) 40 MG tablet Take 1 tablet (40 mg total) by mouth every morning. 90 tablet 1   rivaroxaban  (XARELTO ) 10 MG TABS tablet Take 1 tablet (10 mg total) by mouth  daily. 30 tablet 2   rosuvastatin  (CRESTOR ) 20 MG tablet Take 1 tablet (20 mg total) by mouth daily. 90 tablet 0   sucralfate  (CARAFATE ) 1 g tablet TAKE 1 TABLET FOUR TIMES DAILY 240 tablet 5   Tezepelumab -ekko (TEZSPIRE ) 210 MG/1. SOAJ Inject 210 mg into the skin every 28 (twenty-eight) days. 5.73 mL 3   triamcinolone  (NASACORT ) 55 MCG/ACT AERO nasal inhaler Place 2 sprays into the nose daily. 48 g 1   valsartan  (DIOVAN ) 40 MG tablet Take 1 tablet (40 mg total) by mouth daily. 30 tablet 11   No current facility-administered medications for this visit.    Allergies:   Aspirin, Ibuprofen, Other, Shellfish allergy, Shellfish-derived products, Bee venom, Iodine , and Lisinopril    Social History:   reports that she has never smoked. She has never used smokeless tobacco. She reports that she does not drink alcohol and does not use drugs.   Family History:  family  history is not on file.    ROS:     Review of Systems  Constitutional: Negative.   HENT: Negative.    Eyes: Negative.   Respiratory: Negative.    Gastrointestinal: Negative.   Genitourinary: Negative.   Musculoskeletal: Negative.   Skin: Negative.   Neurological: Negative.   Endo/Heme/Allergies: Negative.   Psychiatric/Behavioral: Negative.    All other systems reviewed and are negative.     All other systems are reviewed and negative.    PHYSICAL EXAM: VS:  BP 115/70   Pulse 65   Ht 5' 3 (1.6 m)   Wt 174 lb 3.2 oz (79 kg)   SpO2 97%   BMI 30.86 kg/m  , BMI Body mass index is 30.86 kg/m. Last weight:  Wt Readings from Last 3 Encounters:  04/11/24 174 lb 3.2 oz (79 kg)  03/31/24 171 lb 3.2 oz (77.7 kg)  03/25/24 173 lb 9.6 oz (78.7 kg)     Physical Exam Constitutional:      Appearance: Normal appearance.  Cardiovascular:     Rate and Rhythm: Normal rate and regular rhythm.     Heart sounds: Normal heart sounds.  Pulmonary:     Effort: Pulmonary effort is normal.     Breath sounds: Normal breath sounds.  Musculoskeletal:     Right lower leg: No edema.     Left lower leg: No edema.  Neurological:     Mental Status: She is alert.       EKG:   Recent Labs: 05/25/2023: TSH 1.730 11/20/2023: ALT 12; BUN 14; Creatinine, Ser 1.04; Potassium 4.1; Sodium 142    Lipid Panel    Component Value Date/Time   CHOL 135 05/25/2023 0936   TRIG 86 05/25/2023 0936   HDL 72 05/25/2023 0936   CHOLHDL 1.9 05/25/2023 0936   LDLCALC 47 05/25/2023 0936      Other studies Reviewed: Additional studies/ records that were reviewed today include:  Review of the above records demonstrates:       No data to display            ASSESSMENT AND PLAN:    ICD-10-CM   1. Mixed hyperlipidemia  E78.2     2. Essential hypertension, benign  I10    Change diovan  to 40 helped, BP much better, as had hyoptension before as cause of dizziness.    3. Bradycardia  R00.1     HR much better 65, with metooprolol 12.5 daaily, no lightheaded ness or blurring vision.    4. Chest pain, non-cardiac  R07.89        Problem List Items Addressed This Visit       Cardiovascular and Mediastinum   Essential hypertension, benign     Other   Mixed hyperlipidemia - Primary   Other Visit Diagnoses       Bradycardia       HR much better 65, with metooprolol 12.5 daaily, no lightheaded ness or blurring vision.     Chest pain, non-cardiac              Disposition:   Return in about 3 months (around 07/12/2024).    Total time spent: 30 minutes  Signed,  Denyse Bathe, MD  04/11/2024 1:44 PM    [facility]

## 2024-04-11 NOTE — Telephone Encounter (Signed)
 Pt's mail order pharmacy faxed requesting refills please advise

## 2024-04-12 MED ORDER — RIVAROXABAN 10 MG PO TABS: 10.0000 mg | ORAL_TABLET | Freq: Every day | ORAL | 2 refills | Status: DC

## 2024-04-12 MED ORDER — METOPROLOL SUCCINATE ER 25 MG PO TB24: 12.5000 mg | ORAL_TABLET | Freq: Every day | ORAL | 11 refills | Status: AC

## 2024-04-12 MED ORDER — VALSARTAN 40 MG PO TABS
40.0000 mg | ORAL_TABLET | Freq: Every day | ORAL | 11 refills | Status: AC
Start: 2024-04-12 — End: 2025-04-12

## 2024-04-14 ENCOUNTER — Other Ambulatory Visit: Payer: Self-pay | Admitting: Family

## 2024-04-14 DIAGNOSIS — E785 Hyperlipidemia, unspecified: Secondary | ICD-10-CM

## 2024-04-15 ENCOUNTER — Other Ambulatory Visit (HOSPITAL_COMMUNITY): Payer: Self-pay

## 2024-04-15 MED ORDER — TEZSPIRE 210 MG/1.91ML ~~LOC~~ SOAJ
210.0000 mg | SUBCUTANEOUS | 11 refills | Status: AC
  Filled 2024-04-21: qty 1.91, 28d supply, fill #0
  Filled 2024-05-16: qty 1.91, 28d supply, fill #1
  Filled 2024-06-14: qty 1.91, 28d supply, fill #2
  Filled 2024-07-22: qty 1.91, 28d supply, fill #3
  Filled 2024-08-22: qty 1.91, 28d supply, fill #4
  Filled 2024-09-09 – 2024-09-28 (×2): qty 1.91, 28d supply, fill #5
  Filled 2024-10-26 – 2024-11-02 (×3): qty 1.91, 28d supply, fill #6

## 2024-04-15 NOTE — Addendum Note (Signed)
 Addended by: OTHA MADELIN HERO on: 04/15/2024 11:56 AM   Modules accepted: Orders

## 2024-04-17 ENCOUNTER — Other Ambulatory Visit: Payer: Self-pay | Admitting: Family

## 2024-04-17 DIAGNOSIS — I10 Essential (primary) hypertension: Secondary | ICD-10-CM

## 2024-04-18 ENCOUNTER — Telehealth: Payer: Self-pay | Admitting: *Deleted

## 2024-04-18 NOTE — Telephone Encounter (Signed)
 Advised patient of  approval and submit to [facility] for Tezspire  with $0 copay. Will reach out once delivery set to make appt

## 2024-04-21 ENCOUNTER — Other Ambulatory Visit: Payer: Self-pay

## 2024-04-21 NOTE — Progress Notes (Signed)
 Specialty Pharmacy Initial Fill Coordination Note  Anne Kerr is a 73 y.o. female contacted today regarding initial fill of specialty medication(s) Tezepelumab -ekko (Tezspire )   Patient requested Courier to Provider Office   Delivery date: 04/26/24   Verified address: [address] [place] [address]   Medication will be filled on 07/28.   Patient is aware of $0.00 copayment.

## 2024-04-21 NOTE — Progress Notes (Signed)
 Specialty Pharmacy Initiation Note   Anne Kerr is a 73 y.o. female who will be followed by the specialty pharmacy service for RxSp Asthma/COPD    Review of administration, indication, effectiveness, safety, potential side effects, storage/disposable, and missed dose instructions occurred today for patient's specialty medication(s) Tezepelumab -ekko (Tezspire )     Patient/Caregiver did not have any additional questions or concerns.   Patient's therapy is appropriate to: Initiate    Goals Addressed             This Visit's Progress    Reduce disease symptoms including coughing and shortness of breath       Patient is initiating therapy. Patient will maintain adherence        We will continue with annual follow-up, and the patient was encouraged to reach out with any questions or concerns in the meantime.  Silvano LOISE Dolly Specialty Pharmacist

## 2024-04-29 ENCOUNTER — Ambulatory Visit

## 2024-04-29 DIAGNOSIS — J4550 Severe persistent asthma, uncomplicated: Secondary | ICD-10-CM | POA: Diagnosis not present

## 2024-04-29 MED ORDER — TEZEPELUMAB-EKKO 210 MG/1.91ML ~~LOC~~ SOSY
210.0000 mg | PREFILLED_SYRINGE | SUBCUTANEOUS | Status: AC
  Administered 2024-04-29 – 2024-10-10 (×6): 210 mg via SUBCUTANEOUS

## 2024-05-03 ENCOUNTER — Other Ambulatory Visit: Payer: Self-pay | Admitting: Family

## 2024-05-06 ENCOUNTER — Other Ambulatory Visit: Payer: Self-pay

## 2024-05-10 ENCOUNTER — Encounter: Payer: Self-pay | Admitting: Internal Medicine

## 2024-05-10 DIAGNOSIS — H43813 Vitreous degeneration, bilateral: Secondary | ICD-10-CM | POA: Diagnosis not present

## 2024-05-10 DIAGNOSIS — E119 Type 2 diabetes mellitus without complications: Secondary | ICD-10-CM | POA: Diagnosis not present

## 2024-05-10 DIAGNOSIS — Z0100 Encounter for examination of eyes and vision without abnormal findings: Secondary | ICD-10-CM | POA: Diagnosis not present

## 2024-05-10 DIAGNOSIS — H2513 Age-related nuclear cataract, bilateral: Secondary | ICD-10-CM | POA: Diagnosis not present

## 2024-05-10 DIAGNOSIS — H04123 Dry eye syndrome of bilateral lacrimal glands: Secondary | ICD-10-CM | POA: Diagnosis not present

## 2024-05-12 ENCOUNTER — Other Ambulatory Visit: Payer: Self-pay | Admitting: Allergy and Immunology

## 2024-05-16 ENCOUNTER — Other Ambulatory Visit: Payer: Self-pay

## 2024-05-16 ENCOUNTER — Other Ambulatory Visit: Payer: Self-pay | Admitting: Pharmacy Technician

## 2024-05-16 NOTE — Progress Notes (Signed)
 Specialty Pharmacy Refill Coordination Note  ABEER DESKINS is a 73 y.o. female assessed today regarding refills of clinic administered specialty medication(s) Tezepelumab -ekko (TEZSPIRE )   Clinic requested Courier to Provider Office   Delivery date: 05/19/24   Verified address: [facility] [address] [place], [place], [place] [address]   Medication will be filled on 05/18/24. Appt inj on 8/29. $0.00 co-pay.

## 2024-05-17 ENCOUNTER — Other Ambulatory Visit: Payer: Self-pay

## 2024-05-18 ENCOUNTER — Other Ambulatory Visit: Payer: Self-pay

## 2024-05-27 ENCOUNTER — Ambulatory Visit (INDEPENDENT_AMBULATORY_CARE_PROVIDER_SITE_OTHER)

## 2024-05-27 DIAGNOSIS — J4550 Severe persistent asthma, uncomplicated: Secondary | ICD-10-CM

## 2024-06-08 ENCOUNTER — Other Ambulatory Visit: Payer: Self-pay

## 2024-06-14 ENCOUNTER — Other Ambulatory Visit: Payer: Self-pay

## 2024-06-14 ENCOUNTER — Ambulatory Visit: Admitting: Cardiovascular Disease

## 2024-06-14 ENCOUNTER — Other Ambulatory Visit: Payer: Self-pay | Admitting: Pharmacy Technician

## 2024-06-14 NOTE — Progress Notes (Signed)
 Specialty Pharmacy Refill Coordination Note  Anne Kerr is a 73 y.o. female assessed today regarding refills of clinic administered specialty medication(s) Tezepelumab -ekko (TEZSPIRE )   Clinic requested Courier to Provider Office   Delivery date: 06/20/24   Verified address: [facility] [address]   Medication will be filled on 06/17/24.  Injection Appointment 06/24/24

## 2024-06-16 ENCOUNTER — Other Ambulatory Visit: Payer: Self-pay

## 2024-06-24 ENCOUNTER — Ambulatory Visit

## 2024-07-07 ENCOUNTER — Ambulatory Visit

## 2024-07-07 DIAGNOSIS — J4550 Severe persistent asthma, uncomplicated: Secondary | ICD-10-CM | POA: Diagnosis not present

## 2024-07-12 ENCOUNTER — Encounter: Payer: Self-pay | Admitting: Cardiovascular Disease

## 2024-07-12 ENCOUNTER — Ambulatory Visit (INDEPENDENT_AMBULATORY_CARE_PROVIDER_SITE_OTHER): Admitting: Cardiovascular Disease

## 2024-07-12 VITALS — BP 112/68 | HR 76 | Ht 63.0 in | Wt 173.0 lb

## 2024-07-12 DIAGNOSIS — R42 Dizziness and giddiness: Secondary | ICD-10-CM | POA: Diagnosis not present

## 2024-07-12 DIAGNOSIS — I5033 Acute on chronic diastolic (congestive) heart failure: Secondary | ICD-10-CM

## 2024-07-12 DIAGNOSIS — R001 Bradycardia, unspecified: Secondary | ICD-10-CM

## 2024-07-12 DIAGNOSIS — I10 Essential (primary) hypertension: Secondary | ICD-10-CM | POA: Diagnosis not present

## 2024-07-12 DIAGNOSIS — I340 Nonrheumatic mitral (valve) insufficiency: Secondary | ICD-10-CM | POA: Diagnosis not present

## 2024-07-12 DIAGNOSIS — E782 Mixed hyperlipidemia: Secondary | ICD-10-CM | POA: Diagnosis not present

## 2024-07-12 NOTE — Progress Notes (Signed)
 Cardiology Office Note   Date:  07/12/2024   ID:  Anne Kerr, DOB 08-27-1951, MRN [id number]  PCP:  Orlean Alan HERO, FNP  Cardiologist:  Denyse Bathe, MD      History of Present Illness: Anne Kerr is a 73 y.o. female who presents for  Chief Complaint  Patient presents with   Follow-up    3 month follow up    Feeling good.      Past Medical History:  Diagnosis Date   Acute non-recurrent maxillary sinusitis 07/25/2015   Arthritis    Asthma    allergy induced   Complication of anesthesia    hard to wake up   Diabetes mellitus without complication (HCC)    GERD (gastroesophageal reflux disease)    High blood pressure    History of ear surgery 05/01/2021   History of kidney stones    h/o   Hyperlipidemia    Leaky heart valve    Leaky heart valve    Pain 07/09/2016   Pain in left knee 04/27/2019   Tachycardia    TIA (transient ischemic attack)    no residual side effects-pt states she didnt even know- a doctor told her this   Uterine cancer (HCC) 1989     Past Surgical History:  Procedure Laterality Date   COLONOSCOPY     KNEE ARTHROSCOPY Right    NASAL SINUS SURGERY     x 4   PARTIAL HYSTERECTOMY     SINOSCOPY     TOTAL KNEE ARTHROPLASTY Left 05/11/2019   Procedure: TOTAL KNEE ARTHROPLASTY;  Surgeon: Leora Lynwood JONELLE, MD;  Location: [facility] ORS;  Service: Orthopedics;  Laterality: Left;     Current Outpatient Medications  Medication Sig Dispense Refill   albuterol  (PROVENTIL ) (2.5 MG/3ML) 0.083% nebulizer solution Take 3 mLs (2.5 mg total) by nebulization every 4 (four) hours as needed for wheezing or shortness of breath. 75 mL 1   albuterol  (VENTOLIN  HFA) 108 (90 Base) MCG/ACT inhaler Inhale 2 puffs into the lungs every 4 (four) hours as needed for wheezing or shortness of breath (Use with Flovent  44 during asthma flare ups.). 18 g 1   budeson-glycopyrrolate -formoterol  (BREZTRI  AEROSPHERE) 160-9-4.8 MCG/ACT AERO inhaler Inhale 2 puffs into  the lungs in the morning and at bedtime. 32.1 g 1   cetirizine  (ZYRTEC ) 10 MG tablet TAKE 1 TABLET BY MOUTH EVERY DAY 90 tablet 1   Cholecalciferol  (VITAMIN D -3) 125 MCG (5000 UT) TABS Take 5,000 Units by mouth daily.      Coenzyme Q10 100 MG capsule Take by mouth.     Cyanocobalamin  (VITAMIN B-12 PO) Take 3,000 mcg by mouth daily.      EPINEPHrine  (EPIPEN  2-PAK) 0.3 mg/0.3 mL IJ SOAJ injection Inject 0.3 mg into the muscle as needed for anaphylaxis. 2 each 1   ipratropium-albuterol  (DUONEB) 0.5-2.5 (3) MG/3ML SOLN Take 3 mLs by nebulization every 6 (six) hours as needed. 100 mL 1   JARDIANCE  10 MG TABS tablet TAKE 1 TABLET EVERY DAY 90 tablet 3   metFORMIN  (GLUCOPHAGE ) 500 MG tablet Take 1 tablet (500 mg total) by mouth daily. 90 tablet 1   metoprolol  succinate (TOPROL  XL) 25 MG 24 hr tablet Take 0.5 tablets (12.5 mg total) by mouth daily. 15 tablet 11   Nebulizers (COMP AIR COMPRESSOR NEBULIZER) MISC 1 DEVICE BY DOES NOT APPLY ROUTE AS DIRECTED. 1 each 1   pantoprazole  (PROTONIX ) 40 MG tablet Take 1 tablet (40 mg total) by mouth every morning.  90 tablet 1   rivaroxaban  (XARELTO ) 10 MG TABS tablet Take 1 tablet (10 mg total) by mouth daily. 30 tablet 2   rosuvastatin  (CRESTOR ) 20 MG tablet TAKE 1 TABLET EVERY DAY 90 tablet 3   sucralfate  (CARAFATE ) 1 g tablet TAKE 1 TABLET FOUR TIMES DAILY 240 tablet 5   Tezepelumab -ekko (TEZSPIRE ) 210 MG/1. SOAJ Inject 210 mg into the skin every 28 (twenty-eight) days. 1.91 mL 11   triamcinolone  (NASACORT ) 55 MCG/ACT AERO nasal inhaler Place 2 sprays into the nose daily. 48 g 1   valsartan  (DIOVAN ) 40 MG tablet Take 1 tablet (40 mg total) by mouth daily. 30 tablet 11   Current Facility-Administered Medications  Medication Dose Route Frequency Provider Last Rate Last Admin   tezepelumab -ekko (TEZSPIRE ) 210 MG/1. syringe 210 mg  210 mg Subcutaneous Q28 days Jeneal Danita Macintosh, MD   210 mg at 07/07/24 1108    Allergies:   Aspirin, Ibuprofen,  Other, Shellfish allergy, Shellfish protein-containing drug products, Bee venom, Iodine , and Lisinopril    Social History:   reports that she has never smoked. She has never used smokeless tobacco. She reports that she does not drink alcohol and does not use drugs.   Family History:  family history is not on file.    ROS:     Review of Systems  Constitutional: Negative.   HENT: Negative.    Eyes: Negative.   Respiratory: Negative.    Gastrointestinal: Negative.   Genitourinary: Negative.   Musculoskeletal: Negative.   Skin: Negative.   Neurological: Negative.   Endo/Heme/Allergies: Negative.   Psychiatric/Behavioral: Negative.    All other systems reviewed and are negative.     All other systems are reviewed and negative.    PHYSICAL EXAM: VS:  BP 112/68   Pulse 76   Ht 5' 3 (1.6 m)   Wt 173 lb (78.5 kg)   SpO2 98%   BMI 30.65 kg/m  , BMI Body mass index is 30.65 kg/m. Last weight:  Wt Readings from Last 3 Encounters:  07/12/24 173 lb (78.5 kg)  04/11/24 174 lb 3.2 oz (79 kg)  03/31/24 171 lb 3.2 oz (77.7 kg)     Physical Exam Constitutional:      Appearance: Normal appearance.  Cardiovascular:     Rate and Rhythm: Normal rate and regular rhythm.     Heart sounds: Normal heart sounds.  Pulmonary:     Effort: Pulmonary effort is normal.     Breath sounds: Normal breath sounds.  Musculoskeletal:     Right lower leg: No edema.     Left lower leg: No edema.  Neurological:     Mental Status: She is alert.       EKG:   Recent Labs: 11/20/2023: ALT 12; BUN 14; Creatinine, Ser 1.04; Potassium 4.1; Sodium 142    Lipid Panel    Component Value Date/Time   CHOL 135 05/25/2023 0936   TRIG 86 05/25/2023 0936   HDL 72 05/25/2023 0936   CHOLHDL 1.9 05/25/2023 0936   LDLCALC 47 05/25/2023 0936      Other studies Reviewed: Additional studies/ records that were reviewed today include:  Review of the above records demonstrates:       No data to  display            ASSESSMENT AND PLAN:    ICD-10-CM   1. Essential hypertension, benign  I10     2. Mixed hyperlipidemia  E78.2     3. Dizziness  R42  4. Nonrheumatic mitral valve regurgitation  I34.0     5. Bradycardia  R00.1    ON 12.5 metoprolol , HR better and no dizziness.    6. CHF (congestive heart failure), NYHA class III, acute on chronic, diastolic (HCC)  I50.33    Diastolic dysfunction, on jaurdiance.       Problem List Items Addressed This Visit       Cardiovascular and Mediastinum   Essential hypertension, benign - Primary     Other   Mixed hyperlipidemia   Other Visit Diagnoses       Dizziness         Nonrheumatic mitral valve regurgitation         Bradycardia       ON 12.5 metoprolol , HR better and no dizziness.     CHF (congestive heart failure), NYHA class III, acute on chronic, diastolic (HCC)       Diastolic dysfunction, on jaurdiance.          Disposition:   Return in about 3 months (around 10/12/2024).    Total time spent: 30 minutes  Signed,  Denyse Bathe, MD  07/12/2024 9:42 AM    [facility]

## 2024-07-22 ENCOUNTER — Other Ambulatory Visit: Payer: Self-pay

## 2024-07-22 NOTE — Progress Notes (Signed)
 Specialty Pharmacy Refill Coordination Note  Anne Kerr is a 73 y.o. female assessed today regarding refills of clinic administered specialty medication(s) Tezepelumab -ekko (TEZSPIRE )   Clinic requested Courier to Provider Office   Delivery date: 07/26/24   Verified address: [facility] [address]   Medication will be filled on: 07/25/24

## 2024-07-25 ENCOUNTER — Encounter: Payer: Self-pay | Admitting: Family

## 2024-07-25 ENCOUNTER — Ambulatory Visit (INDEPENDENT_AMBULATORY_CARE_PROVIDER_SITE_OTHER): Admitting: Family

## 2024-07-25 VITALS — BP 122/78 | HR 61 | Ht 63.0 in | Wt 177.6 lb

## 2024-07-25 DIAGNOSIS — H9312 Tinnitus, left ear: Secondary | ICD-10-CM

## 2024-07-25 DIAGNOSIS — I10 Essential (primary) hypertension: Secondary | ICD-10-CM

## 2024-07-25 DIAGNOSIS — Z23 Encounter for immunization: Secondary | ICD-10-CM | POA: Diagnosis not present

## 2024-07-25 DIAGNOSIS — H9192 Unspecified hearing loss, left ear: Secondary | ICD-10-CM | POA: Diagnosis not present

## 2024-07-25 DIAGNOSIS — R5383 Other fatigue: Secondary | ICD-10-CM | POA: Diagnosis not present

## 2024-07-25 DIAGNOSIS — E538 Deficiency of other specified B group vitamins: Secondary | ICD-10-CM

## 2024-07-25 DIAGNOSIS — Z794 Long term (current) use of insulin: Secondary | ICD-10-CM

## 2024-07-25 DIAGNOSIS — E1165 Type 2 diabetes mellitus with hyperglycemia: Secondary | ICD-10-CM

## 2024-07-25 DIAGNOSIS — E559 Vitamin D deficiency, unspecified: Secondary | ICD-10-CM | POA: Diagnosis not present

## 2024-07-25 DIAGNOSIS — E782 Mixed hyperlipidemia: Secondary | ICD-10-CM

## 2024-07-25 NOTE — Patient Instructions (Signed)
 [facility] phone number:   [phone]   Eyedrops: Zaditor

## 2024-07-25 NOTE — Progress Notes (Signed)
 Established Patient Office Visit  Subjective:  Patient ID: Anne Kerr, female    DOB: 06/21/1951  Age: 73 y.o. MRN: [id number]  Chief Complaint  Patient presents with   Follow-up    4 month follow up    Patient is here today for her 4 months follow up.  She has been feeling fairly well since last appointment.   She does have additional concerns to discuss today.  Anne Kerr presents for routine follow-up and reports recent weight fluctuations, having lost several pounds but subsequently regained the weight after consuming homemade cake with morning coffee. She describes experiencing sneezing episodes this morning and reports ongoing allergy symptoms including eye itching and redness. She mentions previous ear concerns with fluid accumulation affecting hearing and requests follow-up for ear examination, as a prior referral to Dr. Blair from February appears to have not been processed successfully. Anne Kerr has completed recent eye examination and received influenza vaccination during today's visit. She discusses family health concerns and mentions brother's lung nodules requiring radiation treatment with subsequent spread. Her husband has bone cancer and receives injections, with frequency reduced from monthly to every six months over time. Labs are due today.  She needs refills.   I have reviewed her active problem list, medication list, allergies, health maintenance, notes from last encounter, lab results for her appointment today.      No other concerns at this time.   Past Medical History:  Diagnosis Date   Acute non-recurrent maxillary sinusitis 07/25/2015   Arthritis    Asthma    allergy induced   Complication of anesthesia    hard to wake up   Diabetes mellitus without complication (HCC)    GERD (gastroesophageal reflux disease)    High blood pressure    History of ear surgery 05/01/2021   History of kidney stones    h/o   Hyperlipidemia    Leaky heart valve     Leaky heart valve    Pain 07/09/2016   Pain in left knee 04/27/2019   Tachycardia    TIA (transient ischemic attack)    no residual side effects-Anne Kerr states she didnt even know- a doctor told her this   Uterine cancer (HCC) 1989    Past Surgical History:  Procedure Laterality Date   COLONOSCOPY     KNEE ARTHROSCOPY Right    NASAL SINUS SURGERY     x 4   PARTIAL HYSTERECTOMY     SINOSCOPY     TOTAL KNEE ARTHROPLASTY Left 05/11/2019   Procedure: TOTAL KNEE ARTHROPLASTY;  Surgeon: Leora Lynwood JONELLE, MD;  Location: [facility] ORS;  Service: Orthopedics;  Laterality: Left;    Social History   Socioeconomic History   Marital status: Married    Spouse name: Not on file   Number of children: Not on file   Years of education: Not on file   Highest education level: Not on file  Occupational History   Not on file  Tobacco Use   Smoking status: Never   Smokeless tobacco: Never  Vaping Use   Vaping status: Never Used  Substance and Sexual Activity   Alcohol use: No    Alcohol/week: 0.0 standard drinks of alcohol   Drug use: No   Sexual activity: Not on file  Other Topics Concern   Not on file  Social History Narrative   Not on file   Social Drivers of Health   Financial Resource Strain: Not on file  Food Insecurity: Not on file  Transportation Needs:  Not on file  Physical Activity: Low Risk (06/16/2023)   Received from [facility]   PCARE Exercise SDOH    Exercise: Aerobic    PCare Exercise SDOH: Not on file    Weight Bearing Exercises: No  Stress: Not on file  Social Connections: Not on file  Intimate Partner Violence: Not on file    Family History  Problem Relation Age of Onset   Allergic rhinitis Neg Hx    Angioedema Neg Hx    Asthma Neg Hx    Atopy Neg Hx    Eczema Neg Hx    Immunodeficiency Neg Hx    Urticaria Neg Hx     Allergies  Allergen Reactions   Aspirin Other (See Comments)    Wheeze  Wheeze  Wheeze  Wheeze  GI irritation  Wheeze,  Wheeze, Wheeze   Ibuprofen Cough, Other (See Comments), Shortness Of Breath and Itching    Wheeze  Wheeze  Wheeze  Wheeze  Bronchial tubes swelled up  Wheeze, Wheeze, Wheeze   Other Other (See Comments) and Swelling    Bee Sting  Asthma Symptoms  Bee Sting  Bee Sting  Asthma Symptoms  Asthma Symptoms   Shellfish Allergy Other (See Comments)    Asthma Symptoms   Shellfish Protein-Containing Drug Products Other (See Comments)    Asthma Symptoms  Swelling of bronchial tubes   Bee Venom Swelling   Iodine  Other (See Comments)    ASTHMA SYMPTOMS  ASTHMA SYMPTOMS  ASTHMA SYMPTOMS  ASTHMA SYMPTOMS, ASTHMA SYMPTOMS   Lisinopril     Review of Systems  All other systems reviewed and are negative.      Objective:   BP 122/78   Pulse 61   Ht 5' 3 (1.6 m)   Wt 177 lb 9.6 oz (80.6 kg)   SpO2 99%   BMI 31.46 kg/m   Vitals:   07/25/24 0908  BP: 122/78  Pulse: 61  Height: 5' 3 (1.6 m)  Weight: 177 lb 9.6 oz (80.6 kg)  SpO2: 99%  BMI (Calculated): 31.47    Physical Exam Vitals and nursing note reviewed.  Constitutional:      Appearance: Normal appearance. She is normal weight.  HENT:     Head: Normocephalic.  Eyes:     Extraocular Movements: Extraocular movements intact.     Conjunctiva/sclera: Conjunctivae normal.     Pupils: Pupils are equal, round, and reactive to light.  Cardiovascular:     Rate and Rhythm: Normal rate.  Pulmonary:     Effort: Pulmonary effort is normal.  Neurological:     General: No focal deficit present.     Mental Status: She is alert and oriented to person, place, and time. Mental status is at baseline.  Psychiatric:        Mood and Affect: Mood normal.        Behavior: Behavior normal.        Thought Content: Thought content normal.      No results found for any visits on 07/25/24.  No results found for this or any previous visit (from the past 2160 hours).     Assessment & Plan Flu vaccine need Flu Vaccine given  in office today.  Anne Kerr encouraged to manage side effects with supportive care.   Essential hypertension, benign Blood pressure well controlled with current medications.  Continue current therapy.  Will reassess at follow up.   - CBC w/Diff - CMP w/eGFR  Mixed hyperlipidemia Checking labs today.  Continue  current therapy for lipid control. Will modify as needed based on labwork results.   -CMP w/eGFR -Lipid Panel  Tinnitus of left ear Hearing loss of left ear, unspecified hearing loss type Resending referral to ENT.  Will defer to them for further treatment changes.  Reassess at follow up.  Vitamin D  deficiency, unspecified B12 deficiency due to diet Other fatigue Checking labs today.  Will continue supplements as needed.   - Vitamin D  - Vitamin B12 - TSH  Type 2 diabetes mellitus with hyperglycemia, with long-term current use of insulin (HCC) Checking labs today. Will call Anne Kerr. With results  Continue current diabetes POC, as patient has been well controlled on current regimen.  Will adjust meds if needed based on labs.   -CBC w/Diff -CMP w/eGFR -Hemoglobin A1C     Return in about 1 month (around 08/25/2024) for AWV.   Total time spent: 20 minutes  ALAN CHRISTELLA ARRANT, FNP  07/25/2024   This document may have been prepared by [facility] Voice Recognition software and as such may include unintentional dictation errors.

## 2024-07-25 NOTE — Assessment & Plan Note (Signed)
 Checking labs today.  Will continue supplements as needed.   - Vitamin D  - Vitamin B12 - TSH

## 2024-07-25 NOTE — Assessment & Plan Note (Signed)
 Blood pressure well controlled with current medications.  Continue current therapy.  Will reassess at follow up.   - CBC w/Diff - CMP w/eGFR

## 2024-07-25 NOTE — Assessment & Plan Note (Signed)
 Checking labs today.  Continue current therapy for lipid control. Will modify as needed based on labwork results.   -CMP w/eGFR -Lipid Panel

## 2024-07-25 NOTE — Assessment & Plan Note (Signed)
 Checking labs today. Will call pt. With results  Continue current diabetes POC, as patient has been well controlled on current regimen.  Will adjust meds if needed based on labs.   -CBC w/Diff -CMP w/eGFR -Hemoglobin A1C

## 2024-07-28 ENCOUNTER — Other Ambulatory Visit: Payer: Self-pay

## 2024-08-01 ENCOUNTER — Encounter: Payer: Self-pay | Admitting: Family Medicine

## 2024-08-01 ENCOUNTER — Ambulatory Visit (INDEPENDENT_AMBULATORY_CARE_PROVIDER_SITE_OTHER): Admitting: Family Medicine

## 2024-08-01 ENCOUNTER — Other Ambulatory Visit: Payer: Self-pay | Admitting: Cardiovascular Disease

## 2024-08-01 ENCOUNTER — Other Ambulatory Visit: Payer: Self-pay

## 2024-08-01 ENCOUNTER — Ambulatory Visit

## 2024-08-01 VITALS — BP 118/80 | HR 69 | Temp 97.3°F

## 2024-08-01 DIAGNOSIS — I10 Essential (primary) hypertension: Secondary | ICD-10-CM

## 2024-08-01 DIAGNOSIS — T7800XD Anaphylactic reaction due to unspecified food, subsequent encounter: Secondary | ICD-10-CM

## 2024-08-01 DIAGNOSIS — R001 Bradycardia, unspecified: Secondary | ICD-10-CM

## 2024-08-01 DIAGNOSIS — T7800XA Anaphylactic reaction due to unspecified food, initial encounter: Secondary | ICD-10-CM

## 2024-08-01 DIAGNOSIS — K219 Gastro-esophageal reflux disease without esophagitis: Secondary | ICD-10-CM

## 2024-08-01 DIAGNOSIS — J4550 Severe persistent asthma, uncomplicated: Secondary | ICD-10-CM

## 2024-08-01 DIAGNOSIS — R0789 Other chest pain: Secondary | ICD-10-CM

## 2024-08-01 DIAGNOSIS — E782 Mixed hyperlipidemia: Secondary | ICD-10-CM

## 2024-08-01 DIAGNOSIS — E1165 Type 2 diabetes mellitus with hyperglycemia: Secondary | ICD-10-CM

## 2024-08-01 MED ORDER — CROMOLYN SODIUM 4 % OP SOLN: 2.0000 [drp] | Freq: Four times a day (QID) | OPHTHALMIC | 5 refills | Status: AC | PRN

## 2024-08-01 NOTE — Progress Notes (Signed)
 [address] [place] [address] Dept: [phone]  FOLLOW UP NOTE  Patient ID: Anne Kerr, female    DOB: 23-Aug-1951  Age: 73 y.o. MRN: [id number] Date of Office Visit: 08/01/2024  Assessment  Chief Complaint: Follow-up (Asthma no concerns /Allergies watery eyes red puffy itchy mucous in corners of eyes.), Nasal Congestion, and Sinus Problem  HPI Anne Kerr is a 73 year old female who presents to the clinic for a follow-up visit.  She was last seen in this clinic on 02/09/2024 by Dr. Kozlow for evaluation of asthma on Tezspire  injections, allergic rhinitis, reflux, and food allergy to shellfish.   Discussed the use of AI scribe software for clinical note transcription with the patient, who gave verbal consent to proceed. History of Present Illness  Anne Kerr is a 73 year old female with allergies and asthma who presents with intermittent eye swelling and drainage.  Asthma is reported as well-controlled with no shortness of breath, cough, or wheeze with activity or rest.  She reports that she uses Airsupra  about 2-3 times a month with relief of symptoms.  She reports that she mainly uses Airsupra  after exposure to fragrances or strong cleaners.  She reports that she mainly uses DuoNeb by nebulizer during illness and she is not currently using her Breztri  inhaler.  She continues Tezspire  injections once every 4 weeks with no large or local reactions.  She reports a significant decrease in her symptoms of asthma while continuing on Tezspire  injections.  Allergic rhinitis is reported as moderately well-controlled with occasional postnasal drainage as the main symptom.  She denies rhinorrhea, nasal congestion, or sneezing.  She continues cetirizine  10 mg once a day and occasionally uses azelastine.  She frequently uses nasal saline rinses. Her last environmental allergy skin testing in December 2011 was positive to indoor mold, outdoor mold, and dust mite.  She experiences  intermittent eye swelling and drainage, described as sometimes yellow and thick, occurring four to five days a week. Symptoms worsen during this time of year due to allergies. She feels a sensation of a foreign body in her eye, leading to rubbing, which causes bumps and swelling around the eye. She is not currently using any allergy eye drops or lubricating eyedrops.  Reflux is reported as well-controlled with no current symptoms including heartburn or vomiting.  She continues pantoprazole  daily with relief of symptoms.    She continues to avoid shellfish with no accidental ingestion or EpiPen  use since her last visit to this clinic.  Her EpiPen  set is up-to-date.  Her current medications are listed in the chart.   Drug Allergies:  Allergies  Allergen Reactions   Aspirin Other (See Comments)    Wheeze  Wheeze  Wheeze  Wheeze  GI irritation  Wheeze, Wheeze, Wheeze   Ibuprofen Cough, Other (See Comments), Shortness Of Breath and Itching    Wheeze  Wheeze  Wheeze  Wheeze  Bronchial tubes swelled up  Wheeze, Wheeze, Wheeze   Other Other (See Comments) and Swelling    Bee Sting  Asthma Symptoms  Bee Sting  Bee Sting  Asthma Symptoms  Asthma Symptoms   Shellfish Allergy Other (See Comments)    Asthma Symptoms   Shellfish Protein-Containing Drug Products Other (See Comments)    Asthma Symptoms  Swelling of bronchial tubes   Bee Venom Swelling   Iodine  Other (See Comments)    ASTHMA SYMPTOMS  ASTHMA SYMPTOMS  ASTHMA SYMPTOMS  ASTHMA SYMPTOMS, ASTHMA SYMPTOMS   Lisinopril  Physical Exam: BP 118/80   Pulse 69   Temp (!) 97.3 F (36.3 C)   SpO2 97%    Physical Exam Vitals reviewed.  Constitutional:      Appearance: Normal appearance.  HENT:     Head: Normocephalic and atraumatic.     Right Ear: Tympanic membrane normal.     Left Ear: Tympanic membrane normal.     Nose: Nose normal.     Mouth/Throat:     Comments: Pharynx slightly erythematous with no  exudate Eyes:     Conjunctiva/sclera: Conjunctivae normal.  Cardiovascular:     Rate and Rhythm: Normal rate and regular rhythm.     Heart sounds: Normal heart sounds. No murmur heard. Pulmonary:     Effort: Pulmonary effort is normal.     Breath sounds: Normal breath sounds.     Comments: Lungs clear to auscultation Musculoskeletal:        General: Normal range of motion.     Cervical back: Normal range of motion and neck supple.  Skin:    General: Skin is warm and dry.  Neurological:     Mental Status: She is alert and oriented to person, place, and time.  Psychiatric:        Mood and Affect: Mood normal.        Behavior: Behavior normal.        Thought Content: Thought content normal.        Judgment: Judgment normal.     Diagnostics: FVC 2.24 which is 104% of predicted value, FEV1 1.46 which is 87% of predicted value.  Spirometry indicates normal ventilatory function.  Assessment and Plan: 1. Severe persistent asthma without complication (HCC)   2. Gastroesophageal reflux disease, unspecified whether esophagitis present   3. Allergy with anaphylaxis due to food     Meds ordered this encounter  Medications   cromolyn (OPTICROM) 4 % ophthalmic solution    Sig: Place 2 drops into both eyes 4 (four) times daily as needed.    Dispense:  10 mL    Refill:  5    Patient Instructions   1. Continue Tezepelumab  injections every 4 weeks   2.  For asthma flare begin Breztri  - 2 inhalations 1-2 times per day with spacer for 1 to 2 weeks or until cough and wheeze free oh  3. Continue Nasacort  one spray each nostril 1-2 times per day  4. Continue pantoprazole  40 mg 1 time per day  5. If needed:   A.  Nasal saline multiple times per day  B.  AirSupra  -2 inhalations every 4-6 hours   C.  Duoneb - nebulize every 4-6 hours D.  Epi-Pen E.  Cromolyn eyedrops 1 to 2 drops in each eye up to 4 times a day if needed.  If not effective, then consider an over the counter eye drop.  Some options include Pataday one drop in each eye once a day as needed for red, itchy eyes OR Zaditor one drop in each eye twice a day as needed for red itchy eyes. Avoid eye drops that say red eye relief as they may contain medications that dry out your eyes.   6. Influenza = Tamiflu. Covid = Paxlovid  7. Return to clinic in 6 months or earlier if needed   Return in about 6 months (around 01/29/2025), or if symptoms worsen or fail to improve.    Thank you for the opportunity to care for this patient.  Please do not hesitate to contact  me with questions.  Arlean Mutter, FNP [facility] 

## 2024-08-01 NOTE — Patient Instructions (Addendum)
  1. Continue Tezepelumab  injections every 4 weeks   2.  For asthma flare begin Breztri  - 2 inhalations 1-2 times per day with spacer for 1 to 2 weeks or until cough and wheeze free oh  3. Continue Nasacort  one spray each nostril 1-2 times per day  4. Continue pantoprazole  40 mg 1 time per day  5. If needed:   A.  Nasal saline multiple times per day  B.  AirSupra  -2 inhalations every 4-6 hours   C.  Duoneb - nebulize every 4-6 hours D.  Epi-Pen E.  Cromolyn eyedrops 1 to 2 drops in each eye up to 4 times a day if needed.  If not effective, then consider an over the counter eye drop. Some options include Pataday one drop in each eye once a day as needed for red, itchy eyes OR Zaditor one drop in each eye twice a day as needed for red itchy eyes. Avoid eye drops that say red eye relief as they may contain medications that dry out your eyes.   6. Influenza = Tamiflu. Covid = Paxlovid  7. Return to clinic in 6 months or earlier if needed

## 2024-08-05 DIAGNOSIS — Z1231 Encounter for screening mammogram for malignant neoplasm of breast: Secondary | ICD-10-CM | POA: Diagnosis not present

## 2024-08-10 ENCOUNTER — Encounter: Payer: Self-pay | Admitting: Family

## 2024-08-15 ENCOUNTER — Other Ambulatory Visit: Payer: Self-pay

## 2024-08-16 DIAGNOSIS — J320 Chronic maxillary sinusitis: Secondary | ICD-10-CM | POA: Diagnosis not present

## 2024-08-16 DIAGNOSIS — H90A31 Mixed conductive and sensorineural hearing loss, unilateral, right ear with restricted hearing on the contralateral side: Secondary | ICD-10-CM | POA: Diagnosis not present

## 2024-08-16 DIAGNOSIS — H90A32 Mixed conductive and sensorineural hearing loss, unilateral, left ear with restricted hearing on the contralateral side: Secondary | ICD-10-CM | POA: Diagnosis not present

## 2024-08-17 ENCOUNTER — Other Ambulatory Visit: Payer: Self-pay | Admitting: Otolaryngology

## 2024-08-17 DIAGNOSIS — H90A31 Mixed conductive and sensorineural hearing loss, unilateral, right ear with restricted hearing on the contralateral side: Secondary | ICD-10-CM

## 2024-08-18 ENCOUNTER — Encounter: Payer: Self-pay | Admitting: Otolaryngology

## 2024-08-22 ENCOUNTER — Other Ambulatory Visit: Payer: Self-pay

## 2024-08-22 NOTE — Progress Notes (Signed)
 Specialty Pharmacy Refill Coordination Note  Anne Kerr is a 73 y.o. female assessed today regarding refills of clinic administered specialty medication(s) Tezepelumab -ekko (TEZSPIRE )   Clinic requested Courier to Provider Office   Delivery date: 08/23/24   Verified address: [facility] [address]   Medication will be filled on: 08/22/24   Copay: $0.00 Appointment: 12.1.25

## 2024-08-24 ENCOUNTER — Ambulatory Visit
Admission: RE | Admit: 2024-08-24 | Discharge: 2024-08-24 | Disposition: A | Discharge status: Home / Self Care | Source: Ambulatory Visit | Attending: Otolaryngology | Admitting: Otolaryngology

## 2024-08-24 DIAGNOSIS — H90A31 Mixed conductive and sensorineural hearing loss, unilateral, right ear with restricted hearing on the contralateral side: Secondary | ICD-10-CM

## 2024-08-24 DIAGNOSIS — H9071 Mixed conductive and sensorineural hearing loss, unilateral, right ear, with unrestricted hearing on the contralateral side: Secondary | ICD-10-CM | POA: Diagnosis not present

## 2024-08-29 ENCOUNTER — Encounter: Payer: Self-pay | Admitting: Family

## 2024-08-29 ENCOUNTER — Ambulatory Visit: Admitting: Family

## 2024-08-29 ENCOUNTER — Ambulatory Visit

## 2024-08-29 VITALS — BP 108/80 | HR 58 | Ht 63.0 in | Wt 177.0 lb

## 2024-08-29 DIAGNOSIS — E559 Vitamin D deficiency, unspecified: Secondary | ICD-10-CM

## 2024-08-29 DIAGNOSIS — E782 Mixed hyperlipidemia: Secondary | ICD-10-CM

## 2024-08-29 DIAGNOSIS — Z794 Long term (current) use of insulin: Secondary | ICD-10-CM

## 2024-08-29 DIAGNOSIS — Z0001 Encounter for general adult medical examination with abnormal findings: Secondary | ICD-10-CM | POA: Diagnosis not present

## 2024-08-29 DIAGNOSIS — R5383 Other fatigue: Secondary | ICD-10-CM

## 2024-08-29 DIAGNOSIS — E538 Deficiency of other specified B group vitamins: Secondary | ICD-10-CM

## 2024-08-29 DIAGNOSIS — Z0130 Encounter for examination of blood pressure without abnormal findings: Secondary | ICD-10-CM

## 2024-08-29 DIAGNOSIS — E1165 Type 2 diabetes mellitus with hyperglycemia: Secondary | ICD-10-CM | POA: Diagnosis not present

## 2024-08-29 DIAGNOSIS — Z1159 Encounter for screening for other viral diseases: Secondary | ICD-10-CM | POA: Diagnosis not present

## 2024-08-29 LAB — POCT CBG (FASTING - GLUCOSE)-MANUAL ENTRY: Glucose Fasting, POC: 86 mg/dL (ref 70–99)

## 2024-08-29 LAB — POC CREATINE & ALBUMIN,URINE
Albumin/Creatinine Ratio, Urine, POC: <30
Creatinine, POC: 100 mg/dL
Microalbumin Ur, POC: 10 mg/L

## 2024-08-29 NOTE — Progress Notes (Signed)
 Annual Wellness Visit  Patient: Anne Kerr, Female    DOB: 1951-05-14, 73 y.o.   MRN: [id number] Visit Date: 08/29/2024  Today's Provider: ALAN CHRISTELLA ARRANT, FNP  Subjective:    Chief Complaint  Patient presents with   Annual Exam    AWV   Anne Kerr is a 73 y.o. female who presents today for her Annual Wellness Visit.  She is feeling well overall, has been to ENT, and has had a CT scan for her hearing issues.   Past Medical History:  Diagnosis Date   Acute non-recurrent maxillary sinusitis 07/25/2015   Arthritis    Asthma    allergy induced   Complication of anesthesia    hard to wake up   Diabetes mellitus without complication (HCC)    GERD (gastroesophageal reflux disease)    High blood pressure    History of ear surgery 05/01/2021   History of kidney stones    h/o   Hyperlipidemia    Leaky heart valve    Leaky heart valve    Pain 07/09/2016   Pain in left knee 04/27/2019   Tachycardia    TIA (transient ischemic attack)    no residual side effects-pt states she didnt even know- a doctor told her this   Uterine cancer (HCC) 1989   Past Surgical History:  Procedure Laterality Date   COLONOSCOPY     KNEE ARTHROSCOPY Right    NASAL SINUS SURGERY     x 4   PARTIAL HYSTERECTOMY     SINOSCOPY     TOTAL KNEE ARTHROPLASTY Left 05/11/2019   Procedure: TOTAL KNEE ARTHROPLASTY;  Surgeon: Leora Lynwood JONELLE, MD;  Location: [facility] ORS;  Service: Orthopedics;  Laterality: Left;   Family History  Problem Relation Age of Onset   Allergic rhinitis Neg Hx    Angioedema Neg Hx    Asthma Neg Hx    Atopy Neg Hx    Eczema Neg Hx    Immunodeficiency Neg Hx    Urticaria Neg Hx    Social History   Socioeconomic History   Marital status: Married    Spouse name: Not on file   Number of children: Not on file   Years of education: Not on file   Highest education level: Not on file  Occupational History   Not on file  Tobacco Use   Smoking status: Never    Smokeless tobacco: Never  Vaping Use   Vaping status: Never Used  Substance and Sexual Activity   Alcohol use: No    Alcohol/week: 0.0 standard drinks of alcohol   Drug use: No   Sexual activity: Not on file  Other Topics Concern   Not on file  Social History Narrative   Not on file   Social Drivers of Health   Financial Resource Strain: Not on file  Food Insecurity: Not on file  Transportation Needs: Not on file  Physical Activity: Low Risk (06/16/2023)   Received from [facility]   PCARE Exercise SDOH    Exercise: Aerobic    PCare Exercise SDOH: Not on file    Weight Bearing Exercises: No  Stress: Not on file  Social Connections: Not on file  Intimate Partner Violence: Not on file    Medications: Outpatient Medications Prior to Visit  Medication Sig   albuterol  (PROVENTIL ) (2.5 MG/3ML) 0.083% nebulizer solution Take 3 mLs (2.5 mg total) by nebulization every 4 (four) hours as needed for wheezing or shortness of breath.  albuterol  (VENTOLIN  HFA) 108 (90 Base) MCG/ACT inhaler Inhale 2 puffs into the lungs every 4 (four) hours as needed for wheezing or shortness of breath (Use with Flovent  44 during asthma flare ups.).   budeson-glycopyrrolate -formoterol  (BREZTRI  AEROSPHERE) 160-9-4.8 MCG/ACT AERO inhaler Inhale 2 puffs into the lungs in the morning and at bedtime.   cetirizine  (ZYRTEC ) 10 MG tablet TAKE 1 TABLET BY MOUTH EVERY DAY   Cholecalciferol  (VITAMIN D -3) 125 MCG (5000 UT) TABS Take 5,000 Units by mouth daily.    Coenzyme Q10 100 MG capsule Take by mouth.   Cyanocobalamin  (VITAMIN B-12 PO) Take 3,000 mcg by mouth daily.    EPINEPHrine  (EPIPEN  2-PAK) 0.3 mg/0.3 mL IJ SOAJ injection Inject 0.3 mg into the muscle as needed for anaphylaxis.   ipratropium-albuterol  (DUONEB) 0.5-2.5 (3) MG/3ML SOLN Take 3 mLs by nebulization every 6 (six) hours as needed.   JARDIANCE  10 MG TABS tablet TAKE 1 TABLET EVERY DAY   metFORMIN  (GLUCOPHAGE ) 500 MG tablet Take 1 tablet  (500 mg total) by mouth daily.   metoprolol  succinate (TOPROL  XL) 25 MG 24 hr tablet Take 0.5 tablets (12.5 mg total) by mouth daily.   Nebulizers (COMP AIR COMPRESSOR NEBULIZER) MISC 1 DEVICE BY DOES NOT APPLY ROUTE AS DIRECTED.   pantoprazole  (PROTONIX ) 40 MG tablet Take 1 tablet (40 mg total) by mouth every morning.   rosuvastatin  (CRESTOR ) 20 MG tablet TAKE 1 TABLET EVERY DAY   sucralfate  (CARAFATE ) 1 g tablet TAKE 1 TABLET FOUR TIMES DAILY   Tezepelumab -ekko (TEZSPIRE ) 210 MG/1. SOAJ Inject 210 mg into the skin every 28 (twenty-eight) days.   triamcinolone  (NASACORT ) 55 MCG/ACT AERO nasal inhaler Place 2 sprays into the nose daily.   valsartan  (DIOVAN ) 40 MG tablet Take 1 tablet (40 mg total) by mouth daily.   XARELTO  10 MG TABS tablet TAKE 1 TABLET EVERY DAY   cromolyn  (OPTICROM ) 4 % ophthalmic solution Place 2 drops into both eyes 4 (four) times daily as needed. (Patient not taking: Reported on 08/29/2024)   Facility-Administered Medications Prior to Visit  Medication Dose Route Frequency Provider   tezepelumab -ekko (TEZSPIRE ) 210 MG/1. syringe 210 mg  210 mg Subcutaneous Q28 days Jeneal Danita Macintosh, MD    Allergies  Allergen Reactions   Aspirin Other (See Comments)    Wheeze  Wheeze  Wheeze  Wheeze  GI irritation  Wheeze, Wheeze, Wheeze   Ibuprofen Cough, Other (See Comments), Shortness Of Breath and Itching    Wheeze  Wheeze  Wheeze  Wheeze  Bronchial tubes swelled up  Wheeze, Wheeze, Wheeze   Other Other (See Comments) and Swelling    Bee Sting  Asthma Symptoms  Bee Sting  Bee Sting  Asthma Symptoms  Asthma Symptoms   Shellfish Allergy Other (See Comments)    Asthma Symptoms   Shellfish Protein-Containing Drug Products Other (See Comments)    Asthma Symptoms  Swelling of bronchial tubes   Bee Venom Swelling   Iodine  Other (See Comments)    ASTHMA SYMPTOMS  ASTHMA SYMPTOMS  ASTHMA SYMPTOMS  ASTHMA SYMPTOMS, ASTHMA SYMPTOMS    Lisinopril     Patient Care Team: Orlean Alan HERO, FNP as PCP - General (Family Medicine)  Review of Systems  All other systems reviewed and are negative.     Objective:    Vitals: BP 108/80   Pulse (!) 58   Ht 5' 3 (1.6 m)   Wt 177 lb (80.3 kg)   SpO2 99%   BMI 31.35 kg/m   Physical Exam Vitals  and nursing note reviewed.  Constitutional:      Appearance: Normal appearance. She is normal weight.  HENT:     Head: Normocephalic.     Right Ear: External ear normal. Decreased hearing noted.     Left Ear: External ear normal. Decreased hearing noted.  Eyes:     Extraocular Movements: Extraocular movements intact.     Conjunctiva/sclera: Conjunctivae normal.     Pupils: Pupils are equal, round, and reactive to light.  Cardiovascular:     Rate and Rhythm: Normal rate.  Pulmonary:     Effort: Pulmonary effort is normal.  Neurological:     General: No focal deficit present.     Mental Status: She is alert and oriented to person, place, and time. Mental status is at baseline.  Psychiatric:        Mood and Affect: Mood normal.        Behavior: Behavior normal.        Thought Content: Thought content normal.        Judgment: Judgment normal.      Most recent functional status assessment:    08/29/2024    3:20 PM  In your present state of health, do you have any difficulty performing the following activities:  Hearing? 1  Vision? 0  Difficulty concentrating or making decisions? 0  Walking or climbing stairs? 0  Dressing or bathing? 0  Doing errands, shopping? 0    Most recent fall risk assessment:    08/29/2024   11:29 AM  Fall Risk   Falls in the past year? 0  Number falls in past yr: 0  Injury with Fall? 0  Risk for fall due to : Orthopedic patient  Follow up Education provided;Falls evaluation completed     Most recent depression screenings:    08/29/2024    3:18 PM 08/25/2023    1:47 PM  PHQ 2/9 Scores  PHQ - 2 Score 0 0  PHQ- 9 Score 0 0       Data saved with a previous flowsheet row definition    Most recent cognitive screening:    08/29/2024   11:29 AM  6CIT Screen  What Year? 0 points  What month? 0 points  What time? 0 points  Count back from 20 0 points  Months in reverse 0 points  Repeat phrase 2 points  Total Score 2 points    Results for orders placed or performed in visit on 08/29/24  POCT CBG (Fasting - Glucose)  Result Value Ref Range   Glucose Fasting, POC 86 70 - 99 mg/dL  POC CREATINE & ALBUMIN,URINE  Result Value Ref Range   Microalbumin Ur, POC 10 mg/L   Creatinine, POC 100 mg/dL   Albumin/Creatinine Ratio, Urine, POC <30        Assessment & Plan:     Annual wellness visit done today including the all of the following: Reviewed patient's Family Medical History Reviewed and updated list of patient's medical providers Assessment of cognitive impairment was done Assessed patient's functional ability Established a written schedule for health screening services Health Risk Assessent Completed and Reviewed  Exercise Activities and Dietary recommendations  Goals      Reduce disease symptoms including coughing and shortness of breath     Patient is initiating therapy. Patient will maintain adherence         Immunization History  Administered Date(s) Administered   Fluad Trivalent(High Dose 65+) 07/25/2024   INFLUENZA, HIGH DOSE SEASONAL PF 07/06/2017, 07/22/2019  Health Maintenance  Topic Date Due   COVID-19 Vaccine (1) Never done   Hepatitis C Screening  Never done   DTaP/Tdap/Td (1 - Tdap) Never done   Pneumococcal Vaccine: 50+ Years (1 of 2 - PCV) Never done   Zoster Vaccines- Shingrix (1 of 2) Never done   Colonoscopy  Never done   FOOT EXAM  11/23/2020   HEMOGLOBIN A1C  11/25/2023   OPHTHALMOLOGY EXAM  05/08/2024   Diabetic kidney evaluation - eGFR measurement  11/19/2024   Diabetic kidney evaluation - Urine ACR  08/29/2025   Medicare Annual Wellness (AWV)  08/29/2025    Influenza Vaccine  Completed   Bone Density Scan  Completed   Meningococcal B Vaccine  Aged Out   Mammogram  Discontinued   Discussed health benefits of physical activity, and encouraged her to engage in regular exercise appropriate for her age and condition.    Assessment & Plan Type 2 diabetes mellitus with hyperglycemia, with long-term current use of insulin (HCC) Checking labs today. Will call pt. With results  Continue current diabetes POC, as patient has been well controlled on current regimen.  Will adjust meds if needed based on labs.   -CBC w/Diff -CMP w/eGFR -Hemoglobin A1C  Vitamin D  deficiency, unspecified Other fatigue B12 deficiency due to diet Checking labs today.  Will continue supplements as needed.   - Vitamin D  - Vitamin B12 - TSH  Mixed hyperlipidemia Checking labs today.  Continue current therapy for lipid control. Will modify as needed based on labwork results.   -CMP w/eGFR -Lipid Panel  Need for hepatitis C screening test Test ordered today Will call with results when they are available.        ALAN CHRISTELLA ARRANT, FNP   08/29/2024  This document may have been prepared by [facility] Voice Recognition software and as such may include unintentional dictation errors.

## 2024-08-29 NOTE — Assessment & Plan Note (Signed)
 Checking labs today. Will call pt. With results  Continue current diabetes POC, as patient has been well controlled on current regimen.  Will adjust meds if needed based on labs.   -CBC w/Diff -CMP w/eGFR -Hemoglobin A1C

## 2024-08-29 NOTE — Assessment & Plan Note (Signed)
 Checking labs today.  Will continue supplements as needed.   - Vitamin D  - Vitamin B12 - TSH

## 2024-08-29 NOTE — Assessment & Plan Note (Signed)
 Checking labs today.  Continue current therapy for lipid control. Will modify as needed based on labwork results.   -CMP w/eGFR -Lipid Panel

## 2024-08-30 LAB — LIPID PANEL
Chol/HDL Ratio: 1.8 ratio (ref 0.0–4.4)
Cholesterol, Total: 161 mg/dL (ref 100–199)
HDL: 89 mg/dL (ref >39)
LDL Chol Calc (NIH): 57 mg/dL (ref 0–99)
Triglycerides: 83 mg/dL (ref 0–149)
VLDL Cholesterol Cal: 15 mg/dL (ref 5–40)

## 2024-08-30 LAB — CMP14+EGFR
ALT: 12 IU/L (ref 0–32)
AST: 24 IU/L (ref 0–40)
Albumin: 4.4 g/dL (ref 3.8–4.8)
Alkaline Phosphatase: 77 IU/L (ref 49–135)
BUN/Creatinine Ratio: 10 — ABNORMAL LOW (ref 12–28)
BUN: 10 mg/dL (ref 8–27)
Bilirubin Total: 0.3 mg/dL (ref 0.0–1.2)
CO2: 18 mmol/L — ABNORMAL LOW (ref 20–29)
Calcium: 9.7 mg/dL (ref 8.7–10.3)
Chloride: 110 mmol/L — ABNORMAL HIGH (ref 96–106)
Creatinine, Ser: 0.98 mg/dL (ref 0.57–1.00)
Globulin, Total: 2.8 g/dL (ref 1.5–4.5)
Glucose: 102 mg/dL — ABNORMAL HIGH (ref 70–99)
Potassium: 4.4 mmol/L (ref 3.5–5.2)
Sodium: 147 mmol/L — ABNORMAL HIGH (ref 134–144)
Total Protein: 7.2 g/dL (ref 6.0–8.5)
eGFR: 60 mL/min/1.73 (ref >59)

## 2024-08-30 LAB — HCV INTERPRETATION

## 2024-08-30 LAB — CBC WITH DIFFERENTIAL/PLATELET
Basophils Absolute: 0 x10E3/uL (ref 0.0–0.2)
Basos: 1 %
EOS (ABSOLUTE): 0.2 x10E3/uL (ref 0.0–0.4)
Eos: 3 %
Hematocrit: 42.1 % (ref 34.0–46.6)
Hemoglobin: 13.5 g/dL (ref 11.1–15.9)
Immature Grans (Abs): 0 x10E3/uL (ref 0.0–0.1)
Immature Granulocytes: 0 %
Lymphocytes Absolute: 2.1 x10E3/uL (ref 0.7–3.1)
Lymphs: 37 %
MCH: 26.3 pg — ABNORMAL LOW (ref 26.6–33.0)
MCHC: 32.1 g/dL (ref 31.5–35.7)
MCV: 82 fL (ref 79–97)
Monocytes Absolute: 0.4 x10E3/uL (ref 0.1–0.9)
Monocytes: 7 %
Neutrophils Absolute: 3 x10E3/uL (ref 1.4–7.0)
Neutrophils: 52 %
Platelets: 169 x10E3/uL (ref 150–450)
RBC: 5.14 x10E6/uL (ref 3.77–5.28)
RDW: 15.8 % — ABNORMAL HIGH (ref 11.7–15.4)
WBC: 5.7 x10E3/uL (ref 3.4–10.8)

## 2024-08-30 LAB — HEMOGLOBIN A1C
Est. average glucose Bld gHb Est-mCnc: 131 mg/dL
Hgb A1c MFr Bld: 6.2 % — ABNORMAL HIGH (ref 4.8–5.6)

## 2024-08-30 LAB — VITAMIN B12

## 2024-08-30 LAB — TSH: TSH: 1.29 u[IU]/mL (ref 0.450–4.500)

## 2024-08-30 LAB — VITAMIN D 25 HYDROXY (VIT D DEFICIENCY, FRACTURES): Vit D, 25-Hydroxy: 25.6 ng/mL — ABNORMAL LOW (ref 30.0–100.0)

## 2024-08-30 LAB — HCV AB W REFLEX TO QUANT PCR: HCV Ab: NONREACTIVE

## 2024-09-09 ENCOUNTER — Other Ambulatory Visit (HOSPITAL_COMMUNITY): Payer: Self-pay

## 2024-09-12 ENCOUNTER — Ambulatory Visit

## 2024-09-12 ENCOUNTER — Other Ambulatory Visit: Payer: Self-pay

## 2024-09-12 DIAGNOSIS — J4550 Severe persistent asthma, uncomplicated: Secondary | ICD-10-CM

## 2024-09-28 ENCOUNTER — Encounter: Payer: Self-pay | Admitting: Podiatry

## 2024-09-28 ENCOUNTER — Other Ambulatory Visit: Payer: Self-pay

## 2024-09-28 ENCOUNTER — Ambulatory Visit (INDEPENDENT_AMBULATORY_CARE_PROVIDER_SITE_OTHER): Admitting: Podiatry

## 2024-09-28 DIAGNOSIS — M7751 Other enthesopathy of right foot: Secondary | ICD-10-CM | POA: Diagnosis not present

## 2024-09-28 DIAGNOSIS — M2041 Other hammer toe(s) (acquired), right foot: Secondary | ICD-10-CM

## 2024-09-28 DIAGNOSIS — M21619 Bunion of unspecified foot: Secondary | ICD-10-CM | POA: Diagnosis not present

## 2024-09-28 DIAGNOSIS — D689 Coagulation defect, unspecified: Secondary | ICD-10-CM | POA: Diagnosis not present

## 2024-09-28 DIAGNOSIS — L84 Corns and callosities: Secondary | ICD-10-CM | POA: Diagnosis not present

## 2024-09-28 MED ORDER — TRIAMCINOLONE ACETONIDE 10 MG/ML IJ SUSP
10.0000 mg | Freq: Once | INTRAMUSCULAR | Status: AC
  Administered 2024-09-28: 10 mg via INTRA_ARTICULAR

## 2024-09-28 NOTE — Progress Notes (Signed)
 Specialty Pharmacy Refill Coordination Note  Anne Kerr is a 74 y.o. female contacted today regarding refills of specialty medication(s) Tezepelumab -ekko (TEZSPIRE )   Patient requested Courier to Provider Office   Delivery date: 10/06/24   Verified address: [facility] [address]   Medication will be filled on: 10/05/24

## 2024-09-28 NOTE — Progress Notes (Signed)
 Subjective:   Patient ID: Anne Kerr, female   DOB: 73 y.o.   MRN: [id number]   HPI Patient presents painful hammertoe deformity second digit right and also has significant structural bunion deformity right that is pressing against the second toe and is part of pathology   ROS      Objective:  Physical Exam  Neurovascular status intact with inflammation and pain of the second digit partially at the inner phalangeal joint partially at the metatarsal phalangeal joint with keratotic lesion formation painful with patient on blood thinner.  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  I did discuss surgery which I think could be considered but I do think it would require either Lapidus or first MPJ fusion along with digital correction.  At this point I am in a try to work on it conservatively and depending on response will dictate whether surgery will be necessary.  I did a careful injection of the MPJ second and into the interphalangeal joint second digit 3 mg dexamethasone Kenalog  5 mg Xylocaine  and then did careful debridement of the lesion no iatrogenic bleeding reappoint for us  to recheck and discuss surgery and if necessary patient will see one of our doctors in our group who does the type of procedure that this patient is going to need done

## 2024-10-10 ENCOUNTER — Ambulatory Visit

## 2024-10-10 DIAGNOSIS — J4550 Severe persistent asthma, uncomplicated: Secondary | ICD-10-CM

## 2024-10-18 ENCOUNTER — Ambulatory Visit: Admitting: Cardiovascular Disease

## 2024-10-26 ENCOUNTER — Other Ambulatory Visit: Payer: Self-pay

## 2024-10-28 ENCOUNTER — Other Ambulatory Visit: Payer: Self-pay

## 2024-10-28 ENCOUNTER — Telehealth: Payer: Self-pay

## 2024-10-28 NOTE — Telephone Encounter (Signed)
 Per call center, patient copay for Tezspire  showing at $2100.

## 2024-11-01 ENCOUNTER — Other Ambulatory Visit (HOSPITAL_COMMUNITY): Payer: Self-pay

## 2024-11-02 ENCOUNTER — Other Ambulatory Visit (HOSPITAL_COMMUNITY): Payer: Self-pay

## 2024-11-04 ENCOUNTER — Ambulatory Visit: Admitting: Cardiovascular Disease

## 2024-11-04 ENCOUNTER — Other Ambulatory Visit: Payer: Self-pay

## 2024-11-07 ENCOUNTER — Ambulatory Visit

## 2024-12-29 ENCOUNTER — Ambulatory Visit: Admitting: Family
